# Patient Record
Sex: Male | Born: 1935 | Race: Asian | Hispanic: No | Marital: Married | Smoking: Never smoker
Health system: Southern US, Community
[De-identification: ages and names within clinical notes are randomized; demographics above are authoritative.]

## PROBLEM LIST (undated history)

## (undated) DIAGNOSIS — I10 Essential (primary) hypertension: Secondary | ICD-10-CM

## (undated) DIAGNOSIS — I639 Cerebral infarction, unspecified: Secondary | ICD-10-CM

## (undated) DIAGNOSIS — E119 Type 2 diabetes mellitus without complications: Secondary | ICD-10-CM

## (undated) DIAGNOSIS — I2510 Atherosclerotic heart disease of native coronary artery without angina pectoris: Secondary | ICD-10-CM

## (undated) DIAGNOSIS — G309 Alzheimer's disease, unspecified: Secondary | ICD-10-CM

## (undated) DIAGNOSIS — F0280 Dementia in other diseases classified elsewhere without behavioral disturbance: Secondary | ICD-10-CM

## (undated) HISTORY — PX: CORONARY ARTERY BYPASS GRAFT: SHX141

## (undated) HISTORY — DX: Type 2 diabetes mellitus without complications: E11.9

## (undated) HISTORY — DX: Dementia in other diseases classified elsewhere, unspecified severity, without behavioral disturbance, psychotic disturbance, mood disturbance, and anxiety: F02.80

## (undated) HISTORY — DX: Alzheimer's disease, unspecified: G30.9

## (undated) HISTORY — DX: Essential (primary) hypertension: I10

---

## 1998-11-14 ENCOUNTER — Ambulatory Visit (HOSPITAL_BASED_OUTPATIENT_CLINIC_OR_DEPARTMENT_OTHER): Admission: RE | Admit: 1998-11-14 | Discharge: 1998-11-14 | Payer: Self-pay | Admitting: Orthopedic Surgery

## 1999-05-03 ENCOUNTER — Ambulatory Visit (HOSPITAL_COMMUNITY): Admission: RE | Admit: 1999-05-03 | Discharge: 1999-05-03 | Payer: Self-pay | Admitting: Cardiology

## 1999-05-03 ENCOUNTER — Encounter: Payer: Self-pay | Admitting: Cardiology

## 2002-07-16 ENCOUNTER — Ambulatory Visit (HOSPITAL_COMMUNITY): Admission: RE | Admit: 2002-07-16 | Discharge: 2002-07-16 | Payer: Self-pay | Admitting: General Surgery

## 2002-07-16 ENCOUNTER — Encounter: Payer: Self-pay | Admitting: General Surgery

## 2002-12-24 ENCOUNTER — Ambulatory Visit (HOSPITAL_COMMUNITY): Admission: RE | Admit: 2002-12-24 | Discharge: 2002-12-24 | Payer: Self-pay | Admitting: Gastroenterology

## 2004-07-07 ENCOUNTER — Emergency Department (HOSPITAL_COMMUNITY): Admission: EM | Admit: 2004-07-07 | Discharge: 2004-07-07 | Payer: Self-pay | Admitting: Emergency Medicine

## 2011-07-19 ENCOUNTER — Telehealth: Payer: Self-pay | Admitting: Physician Assistant

## 2011-07-19 NOTE — Telephone Encounter (Signed)
Attempted rx refill

## 2011-08-05 ENCOUNTER — Other Ambulatory Visit: Payer: Self-pay | Admitting: Family Medicine

## 2011-09-09 ENCOUNTER — Other Ambulatory Visit: Payer: Self-pay | Admitting: Physician Assistant

## 2011-12-25 ENCOUNTER — Other Ambulatory Visit: Payer: Self-pay | Admitting: Physician Assistant

## 2012-01-17 ENCOUNTER — Encounter: Payer: Self-pay | Admitting: Family Medicine

## 2012-01-17 ENCOUNTER — Ambulatory Visit (INDEPENDENT_AMBULATORY_CARE_PROVIDER_SITE_OTHER): Payer: Medicare Other | Admitting: Family Medicine

## 2012-01-17 VITALS — BP 131/77 | HR 71 | Temp 98.2°F | Resp 16 | Ht 63.0 in | Wt 135.4 lb

## 2012-01-17 DIAGNOSIS — R4701 Aphasia: Secondary | ICD-10-CM

## 2012-01-17 DIAGNOSIS — I10 Essential (primary) hypertension: Secondary | ICD-10-CM

## 2012-01-17 DIAGNOSIS — R413 Other amnesia: Secondary | ICD-10-CM

## 2012-01-17 DIAGNOSIS — I639 Cerebral infarction, unspecified: Secondary | ICD-10-CM

## 2012-01-17 DIAGNOSIS — E119 Type 2 diabetes mellitus without complications: Secondary | ICD-10-CM

## 2012-01-17 DIAGNOSIS — I39 Endocarditis and heart valve disorders in diseases classified elsewhere: Secondary | ICD-10-CM

## 2012-01-17 LAB — COMPREHENSIVE METABOLIC PANEL
AST: 22 U/L (ref 0–37)
Albumin: 4 g/dL (ref 3.5–5.2)
Alkaline Phosphatase: 43 U/L (ref 39–117)
Glucose, Bld: 73 mg/dL (ref 70–99)
Potassium: 4.1 mEq/L (ref 3.5–5.3)
Sodium: 135 mEq/L (ref 135–145)
Total Bilirubin: 0.3 mg/dL (ref 0.3–1.2)
Total Protein: 6.5 g/dL (ref 6.0–8.3)

## 2012-01-17 LAB — GLUCOSE, POCT (MANUAL RESULT ENTRY): POC Glucose: 73 mg/dl (ref 70–99)

## 2012-01-17 MED ORDER — AMLODIPINE BESYLATE 5 MG PO TABS: 5.0000 mg | ORAL_TABLET | Freq: Every day | ORAL | Status: DC

## 2012-01-17 MED ORDER — MEMANTINE HCL 10 MG PO TABS: 10.0000 mg | ORAL_TABLET | Freq: Two times a day (BID) | ORAL | Status: DC

## 2012-01-17 MED ORDER — CLOPIDOGREL BISULFATE 75 MG PO TABS: 75.0000 mg | ORAL_TABLET | Freq: Every day | ORAL | Status: DC

## 2012-01-17 MED ORDER — METFORMIN HCL 500 MG PO TABS: 500.0000 mg | ORAL_TABLET | Freq: Two times a day (BID) | ORAL | Status: DC

## 2012-01-17 MED ORDER — DEXLANSOPRAZOLE 60 MG PO CPDR: 60.0000 mg | DELAYED_RELEASE_CAPSULE | Freq: Every day | ORAL | Status: DC

## 2012-01-17 MED ORDER — VALSARTAN 80 MG PO TABS: 80.0000 mg | ORAL_TABLET | Freq: Every day | ORAL | Status: DC

## 2012-01-17 NOTE — Patient Instructions (Addendum)
Recheck in the next 1-2 months, sooner if any worsening of symptoms.   Can use over the counter hydrocortisone up to 2 times per day on dry skin of elbows. Can use lubriderm or eucerin up to twice per day as needed for dry skin.  Your should receive a call or letter about your lab results within the next week to 10 days.

## 2012-01-17 NOTE — Progress Notes (Signed)
  Subjective:    Patient ID: Jonathan Richards, male    DOB: [birth year], 78 y.o.   MRN: [id number]  HPI Jonathan Richards is a 78 y.o. male Hx of DM, CVA with aphasia, memory loss. HTN, prior pt of Dr. Hal Hope.  Lives with son and daughter in law. Last ov - 02/11/11. Pneumovax, zostavax given. Metormin, Plavix, Diovan, Norvasc, Namenda rx.  Here for med refills.  Has been out of meds for awhile - atleast a week. No other prescibers of meds.  Has been on all listed meds (obtained from pharmacy).    Dry skin on elbows.  Used cream in past.    Last lipid panel in 8/12. LDL 119.  Last A1C 5.1 02/11/11.    Language barrier and he is unable to talk due to prior cva. .  No known change in symptoms or specific concerns per dtr in law (translating).    Not fasting today.   Review of Systems No new concerns, but limited due to communication barrier.      Objective:   Physical Exam  Constitutional: He appears well-developed and well-nourished. No distress.  HENT:  Head: Normocephalic and atraumatic.  Cardiovascular: Normal rate, regular rhythm, normal heart sounds and intact distal pulses.   Pulmonary/Chest: Effort normal and breath sounds normal.  Abdominal: There is no tenderness.  Neurological: He is alert.       Nonverbal.  Skin: Skin is warm and dry.     Psychiatric: He has a normal mood and affect. His behavior is normal.      Results for orders placed in visit on 01/17/12  GLUCOSE, POCT (MANUAL RESULT ENTRY)      Component Value Range   POC Glucose 73  70 - 99 mg/dl  POCT GLYCOSYLATED HEMOGLOBIN (HGB A1C)      Component Value Range   Hemoglobin A1C 5.4         Assessment & Plan:  .Jonathan Richards is a 78 y.o. male 1. DM2 (diabetes mellitus, type 2)  POCT glucose (manual entry), POCT glycosylated hemoglobin (Hb A1C), Comprehensive metabolic panel  2. HTN (hypertension)  Comprehensive metabolic panel  3. CVA (cerebral infarction)    4. Aphasia    5. Memory loss     DM - very well  controlled.  Will decrease metformin to 500mg  BID. Only to take if eating meals. Recheck in next 1-2 months.   HTN - stable. No change in meds.  Hyperlipidemia - check CMP, plan on fasting labs next ov.    Hx of CVA, with aphasia, memory loss.  Cont plavix and namenda at current doses. meds refilled for 3 months.   Dry skin on elbows. psoraisis likely vs eczema.   Trial otc cortisone, lubriderm or eucerin.  My need to restart TAC cream at next ov.   Discussed recommended diabetes visits every 3 months, plan on fasting lab visit in next 1-2 months.

## 2012-02-18 ENCOUNTER — Other Ambulatory Visit: Payer: Self-pay | Admitting: Family Medicine

## 2012-04-10 ENCOUNTER — Other Ambulatory Visit: Payer: Self-pay | Admitting: Family Medicine

## 2012-04-10 DIAGNOSIS — E119 Type 2 diabetes mellitus without complications: Secondary | ICD-10-CM

## 2012-04-10 MED ORDER — METFORMIN HCL 500 MG PO TABS: 500.0000 mg | ORAL_TABLET | Freq: Two times a day (BID) | ORAL | Status: DC

## 2012-04-10 NOTE — Telephone Encounter (Signed)
Amlodipine refilled.  The metformin 850mg  was not refilled, because the patient's dose was decreased to 500mg  at his last OV in August, so I have refilled at the correct dose.  Additionally, pt was due for follow-up in early October, needs appt for labs.

## 2012-04-12 ENCOUNTER — Other Ambulatory Visit: Payer: Self-pay | Admitting: Family Medicine

## 2012-04-17 ENCOUNTER — Other Ambulatory Visit: Payer: Self-pay | Admitting: Family Medicine

## 2012-05-15 ENCOUNTER — Other Ambulatory Visit: Payer: Self-pay | Admitting: Physician Assistant

## 2012-06-10 ENCOUNTER — Other Ambulatory Visit: Payer: Self-pay | Admitting: Physician Assistant

## 2012-06-19 ENCOUNTER — Other Ambulatory Visit: Payer: Self-pay | Admitting: Physician Assistant

## 2012-06-19 NOTE — Telephone Encounter (Signed)
Needs office visit for further refills

## 2012-07-12 ENCOUNTER — Other Ambulatory Visit: Payer: Self-pay | Admitting: Physician Assistant

## 2012-07-13 NOTE — Telephone Encounter (Signed)
Needs office visit and labs.

## 2012-07-18 ENCOUNTER — Other Ambulatory Visit: Payer: Self-pay | Admitting: Physician Assistant

## 2012-07-31 ENCOUNTER — Other Ambulatory Visit: Payer: Self-pay | Admitting: Physician Assistant

## 2012-08-31 ENCOUNTER — Other Ambulatory Visit: Payer: Self-pay

## 2012-08-31 MED ORDER — DEXLANSOPRAZOLE 60 MG PO CPDR: 60.0000 mg | DELAYED_RELEASE_CAPSULE | Freq: Every day | ORAL | Status: DC

## 2012-09-08 ENCOUNTER — Other Ambulatory Visit: Payer: Self-pay | Admitting: Family Medicine

## 2012-10-03 ENCOUNTER — Other Ambulatory Visit: Payer: Self-pay | Admitting: Physician Assistant

## 2012-10-11 ENCOUNTER — Other Ambulatory Visit: Payer: Self-pay | Admitting: Family Medicine

## 2012-10-11 ENCOUNTER — Other Ambulatory Visit: Payer: Self-pay | Admitting: Physician Assistant

## 2012-10-22 ENCOUNTER — Ambulatory Visit (INDEPENDENT_AMBULATORY_CARE_PROVIDER_SITE_OTHER): Payer: Medicare Other | Admitting: Family Medicine

## 2012-10-22 ENCOUNTER — Encounter: Payer: Self-pay | Admitting: Family Medicine

## 2012-10-22 VITALS — BP 120/70 | HR 82 | Temp 98.4°F | Resp 16 | Ht 65.0 in | Wt 139.0 lb

## 2012-10-22 DIAGNOSIS — F0390 Unspecified dementia without behavioral disturbance: Secondary | ICD-10-CM

## 2012-10-22 DIAGNOSIS — K219 Gastro-esophageal reflux disease without esophagitis: Secondary | ICD-10-CM

## 2012-10-22 DIAGNOSIS — E119 Type 2 diabetes mellitus without complications: Secondary | ICD-10-CM

## 2012-10-22 DIAGNOSIS — F03918 Unspecified dementia, unspecified severity, with other behavioral disturbance: Secondary | ICD-10-CM | POA: Insufficient documentation

## 2012-10-22 DIAGNOSIS — I10 Essential (primary) hypertension: Secondary | ICD-10-CM | POA: Insufficient documentation

## 2012-10-22 DIAGNOSIS — R32 Unspecified urinary incontinence: Secondary | ICD-10-CM

## 2012-10-22 DIAGNOSIS — E1151 Type 2 diabetes mellitus with diabetic peripheral angiopathy without gangrene: Secondary | ICD-10-CM | POA: Insufficient documentation

## 2012-10-22 DIAGNOSIS — R350 Frequency of micturition: Secondary | ICD-10-CM

## 2012-10-22 DIAGNOSIS — L409 Psoriasis, unspecified: Secondary | ICD-10-CM

## 2012-10-22 DIAGNOSIS — F0391 Unspecified dementia with behavioral disturbance: Secondary | ICD-10-CM | POA: Insufficient documentation

## 2012-10-22 LAB — POCT URINALYSIS DIPSTICK
Bilirubin, UA: NEGATIVE
Blood, UA: NEGATIVE
Glucose, UA: NEGATIVE
Ketones, UA: NEGATIVE
Leukocytes, UA: NEGATIVE
Nitrite, UA: NEGATIVE
Protein, UA: 30
Spec Grav, UA: 1.01
Urobilinogen, UA: 0.2
pH, UA: 7

## 2012-10-22 LAB — POCT UA - MICROSCOPIC ONLY
Bacteria, U Microscopic: NEGATIVE
Casts, Ur, LPF, POC: NEGATIVE
Crystals, Ur, HPF, POC: NEGATIVE
Mucus, UA: NEGATIVE
Yeast, UA: NEGATIVE

## 2012-10-22 LAB — POCT GLYCOSYLATED HEMOGLOBIN (HGB A1C): Hemoglobin A1C: 5.6

## 2012-10-22 MED ORDER — CLOPIDOGREL BISULFATE 75 MG PO TABS: 75.0000 mg | ORAL_TABLET | Freq: Every day | ORAL | Status: DC

## 2012-10-22 MED ORDER — MEMANTINE HCL 10 MG PO TABS: 10.0000 mg | ORAL_TABLET | Freq: Two times a day (BID) | ORAL | Status: DC

## 2012-10-22 MED ORDER — AMLODIPINE BESYLATE 5 MG PO TABS: 5.0000 mg | ORAL_TABLET | Freq: Every day | ORAL | Status: DC

## 2012-10-22 MED ORDER — VALSARTAN 80 MG PO TABS: 80.0000 mg | ORAL_TABLET | Freq: Every day | ORAL | Status: DC

## 2012-10-22 MED ORDER — DEXLANSOPRAZOLE 60 MG PO CPDR: 60.0000 mg | DELAYED_RELEASE_CAPSULE | Freq: Every day | ORAL | Status: DC

## 2012-10-22 NOTE — Progress Notes (Signed)
  Subjective:    Patient ID: Jonathan Richards, male    DOB: 10/03/1933, 79 y.o.   MRN: [id number]  HPI    Review of Systems  Constitutional: Negative.   HENT: Negative.   Eyes: Negative.   Respiratory:       When he eats son states likes it goes down the wrong hole.  Cardiovascular: Negative.   Gastrointestinal: Negative.   Endocrine: Negative.   Genitourinary: Positive for frequency.  Allergic/Immunologic: Negative.   Neurological: Negative.   Hematological: Negative.   Psychiatric/Behavioral: Negative.        Objective:   Physical Exam  See above      Assessment & Plan:  See above

## 2012-10-22 NOTE — Patient Instructions (Signed)
Advance Directives  (My Voice, My Choice)  Advance directives are a means for you to make choices about your health care. It is a way that you may accept or refuse medical treatment if you cannot speak for yourself. An advance directive gives you a way to express your wishes about treatment choices in the event that you cannot speak for yourself. These directives protect your right to make your own health care choices. Some examples of advance directives would be:  · A living will is a prepared document that designates your wishes in the event of a serious illness when you cannot care for yourself.  · A patient advocate designation for health care means you choose someone who knows your wishes and can speak for you, on your behalf, should you not be able to do so yourself. This is often a close friend or family member.  · Think about what is important for you in your life. To what extent do you want machines to keep you alive? How much pain are you willing to accept?  · Decide what types of life-sustaining treatments you would or would not want.  · Name a person to be your advocate who understands all your wishes and is willing and able to carry them out.  · A durable power of attorney for health care is a formal legal agreement with an attorney or legal representative who will be bound to carry out your wishes in the event you are unable to care for or represent yourself. This should be someone you trust to make important medical decisions for you.  · Do Not Resuscitate (DNR) is a request to do nothing in the event that your heart stops. A DNR order is used if you are very ill and not expected to recover. DNR orders are accepted by nearly all caregivers and hospitals.  Most caregiver's offices and hospitals have advance directive forms you can use. You may cancel or change these documents at any time. You must be mentally sound and able to communicate your wishes at the time you fill out these forms.  Regardless of  how you let your final wishes be known in the event of a terminal illness, make sure you discuss them with your family and friends. Copies should be given to your caregiver, your hospital, your advocate or attorney, and to significant others. If you travel, you may want to find out what is legal and binding in the states where you will be. Laws vary from state to state.  Document Released: 08/12/2001 Document Revised: 08/26/2011 Document Reviewed: 02/09/2008  Exit[employer]® Patient Information ©2013 [employer].

## 2012-10-22 NOTE — Progress Notes (Addendum)
79 yo [place] man who suffered stroke several years ago. He lives with his son and daughter in law with their three children.  Son needs to travel in July and would like some respite care for a month.  ADL's:  Can feed self, but aspirates frequently  Needs help with dressing  Needs assistance ambulating  Becoming incontinent of urine.  Continent of stool  Becomes irascible at times, complaining about water being too hot or cold  Patient does not recognize some much of the time and confuses words when requesting something  Medical problems:  Diabetes, post CVA, hypertension, urinary incontinence, home bound  Objective:  NAD; frequent hocking and grunting, alert HEENT: Edentulous, TMs normal, EOM normal Neck: Supple, no adenopathy, no bruits, no thyromegaly Chest: Few rhonchi, no rales, breathing normally Heart: Regular no murmur or gallop Abdomen: Soft and nontender Extremities: No edema Skin: Intact with no bedsores, psoriatic plaque in scalp Results for orders placed in visit on 01/17/12  COMPREHENSIVE METABOLIC PANEL      Result Value Range   Sodium 135  135 - 145 mEq/L   Potassium 4.1  3.5 - 5.3 mEq/L   Chloride 100  96 - 112 mEq/L   CO2 25  19 - 32 mEq/L   Glucose, Bld 73  70 - 99 mg/dL   BUN 11  6 - 23 mg/dL   Creat 1.61  0.96 - 0.45 mg/dL   Total Bilirubin 0.3  0.3 - 1.2 mg/dL   Alkaline Phosphatase 43  39 - 117 U/L   AST 22  0 - 37 U/L   ALT 19  0 - 53 U/L   Total Protein 6.5  6.0 - 8.3 g/dL   Albumin 4.0  3.5 - 5.2 g/dL   Calcium 9.7  8.4 - 40.9 mg/dL  GLUCOSE, POCT (MANUAL RESULT ENTRY)      Result Value Range   POC Glucose 73  70 - 99 mg/dl  POCT GLYCOSYLATED HEMOGLOBIN (HGB A1C)      Result Value Range   Hemoglobin A1C 5.4     Results for orders placed in visit on 10/22/12  POCT URINALYSIS DIPSTICK      Result Value Range   Color, UA yellow     Clarity, UA clear     Glucose, UA neg     Bilirubin, UA neg     Ketones, UA neg     Spec Grav, UA 1.010     Blood, UA neg     pH, UA 7.0     Protein, UA 30     Urobilinogen, UA 0.2     Nitrite, UA neg     Leukocytes, UA Negative    POCT UA - MICROSCOPIC ONLY      Result Value Range   WBC, Ur, HPF, POC 0-2     RBC, urine, microscopic 0-1     Bacteria, U Microscopic neg     Mucus, UA neg     Epithelial cells, urine per micros 0-1     Crystals, Ur, HPF, POC neg     Casts, Ur, LPF, POC neg     Yeast, UA neg    POCT GLYCOSYLATED HEMOGLOBIN (HGB A1C)      Result Value Range   Hemoglobin A1C 5.6       Assessment:  79 year old [place] man with multiple ADLs needs following CVA.  He will need nursing care while son is away. He also needs to consider advanced directives. At the present time, he  is stable in his current living situation, but the amount of energy the family expands in chart form must be enormous.  Plan:  Dementia - Plan: Ambulatory referral to Home Health, memantine (NAMENDA) 10 MG tablet, clopidogrel (PLAVIX) 75 MG tablet  Frequency of urination - Plan: POCT urinalysis dipstick, POCT UA - Microscopic Only, POCT glycosylated hemoglobin (Hb A1C), Ambulatory referral to Home Health  Type II or unspecified type diabetes mellitus without mention of complication, not stated as uncontrolled - Plan: POCT urinalysis dipstick, POCT UA - Microscopic Only, POCT glycosylated hemoglobin (Hb A1C), Ambulatory referral to Home Health  Hypertension - Plan: Ambulatory referral to Home Health, valsartan (DIOVAN) 80 MG tablet, amLODipine (NORVASC) 5 MG tablet  Type 2 diabetes mellitus - Plan: Ambulatory referral to Home Health  Incontinence - Plan: Ambulatory referral to Home Health  GERD (gastroesophageal reflux disease) - Plan: dexlansoprazole (DEXILANT) 60 MG capsule  Signed, Elvina Sidle, Md

## 2012-10-23 LAB — COMPREHENSIVE METABOLIC PANEL
ALT: 32 U/L (ref 0–53)
AST: 31 U/L (ref 0–37)
Albumin: 4.1 g/dL (ref 3.5–5.2)
Alkaline Phosphatase: 62 U/L (ref 39–117)
BUN: 13 mg/dL (ref 6–23)
CO2: 24 mEq/L (ref 19–32)
Calcium: 10 mg/dL (ref 8.4–10.5)
Chloride: 104 mEq/L (ref 96–112)
Creat: 1.3 mg/dL (ref 0.50–1.35)
Glucose, Bld: 94 mg/dL (ref 70–99)
Potassium: 4.5 mEq/L (ref 3.5–5.3)
Sodium: 138 mEq/L (ref 135–145)
Total Bilirubin: 0.4 mg/dL (ref 0.3–1.2)
Total Protein: 7.2 g/dL (ref 6.0–8.3)

## 2012-10-23 LAB — CBC WITH DIFFERENTIAL/PLATELET
Basophils Absolute: 0 10*3/uL (ref 0.0–0.1)
Basophils Relative: 0 % (ref 0–1)
Eosinophils Absolute: 0.2 10*3/uL (ref 0.0–0.7)
Eosinophils Relative: 2 % (ref 0–5)
HCT: 40.4 % (ref 39.0–52.0)
Hemoglobin: 14 g/dL (ref 13.0–17.0)
Lymphocytes Relative: 34 % (ref 12–46)
Lymphs Abs: 2.7 10*3/uL (ref 0.7–4.0)
MCH: 31.3 pg (ref 26.0–34.0)
MCHC: 34.7 g/dL (ref 30.0–36.0)
MCV: 90.4 fL (ref 78.0–100.0)
Monocytes Absolute: 0.8 10*3/uL (ref 0.1–1.0)
Monocytes Relative: 11 % (ref 3–12)
Neutro Abs: 4.2 10*3/uL (ref 1.7–7.7)
Neutrophils Relative %: 53 % (ref 43–77)
Platelets: 346 10*3/uL (ref 150–400)
RBC: 4.47 MIL/uL (ref 4.22–5.81)
RDW: 14.3 % (ref 11.5–15.5)
WBC: 8 10*3/uL (ref 4.0–10.5)

## 2012-11-04 ENCOUNTER — Telehealth: Payer: Self-pay | Admitting: Family Medicine

## 2012-11-04 NOTE — Telephone Encounter (Signed)
Notified pt's caregiver to pick forms up at 104.

## 2012-11-15 ENCOUNTER — Other Ambulatory Visit: Payer: Self-pay | Admitting: Physician Assistant

## 2012-12-14 ENCOUNTER — Encounter: Payer: Self-pay | Admitting: Family Medicine

## 2012-12-14 ENCOUNTER — Ambulatory Visit (INDEPENDENT_AMBULATORY_CARE_PROVIDER_SITE_OTHER): Payer: Medicare Other | Admitting: Family Medicine

## 2012-12-14 DIAGNOSIS — Z111 Encounter for screening for respiratory tuberculosis: Secondary | ICD-10-CM

## 2012-12-14 NOTE — Progress Notes (Signed)
Is a 79 year old [place] man is been living with his family. His son is going back to [place] and so he needs to be in a nursing home environment for one to 2 months. He tends to be confused at times but son states that he is usually continent.  Objective: I filled out the forms for the patient so that he can institutionalize well son is away

## 2012-12-16 ENCOUNTER — Telehealth: Payer: Self-pay

## 2012-12-23 ENCOUNTER — Telehealth: Payer: Self-pay

## 2012-12-23 NOTE — Telephone Encounter (Signed)
Do you want to go with their recommendations? Or do you have specific orders?

## 2012-12-23 NOTE — Telephone Encounter (Signed)
[name] w/home health care services is asking for orders to fulfill all the wishes for this patient.  He has numerous issues and they need to know what services Dr L wants them to provide to patient    Jonathan Richards  [phone]

## 2012-12-24 ENCOUNTER — Telehealth: Payer: Self-pay

## 2012-12-24 NOTE — Telephone Encounter (Signed)
I have called [name] left message for her to call me back.

## 2012-12-24 NOTE — Telephone Encounter (Signed)
Let's go with their recommendations.  Otherwise, have patient return for OV to discuss.

## 2012-12-24 NOTE — Telephone Encounter (Signed)
I've really only seen this demented man once.  Let's go with the home health service.  Otherwise, he can come in and see anyone.

## 2012-12-24 NOTE — Telephone Encounter (Signed)
Called [name], order given eval/treat

## 2012-12-24 NOTE — Telephone Encounter (Signed)
[name] from [facility] is returning a phone call about this patient. [phone]

## 2012-12-26 ENCOUNTER — Encounter (HOSPITAL_COMMUNITY): Payer: Self-pay

## 2012-12-26 ENCOUNTER — Emergency Department (HOSPITAL_COMMUNITY)
Admission: EM | Admit: 2012-12-26 | Discharge: 2012-12-26 | Disposition: A | Discharge status: Home / Self Care | Payer: Medicare Other | Attending: Emergency Medicine | Admitting: Emergency Medicine

## 2012-12-26 DIAGNOSIS — E119 Type 2 diabetes mellitus without complications: Secondary | ICD-10-CM | POA: Insufficient documentation

## 2012-12-26 DIAGNOSIS — G309 Alzheimer's disease, unspecified: Secondary | ICD-10-CM | POA: Insufficient documentation

## 2012-12-26 DIAGNOSIS — Y929 Unspecified place or not applicable: Secondary | ICD-10-CM | POA: Insufficient documentation

## 2012-12-26 DIAGNOSIS — I10 Essential (primary) hypertension: Secondary | ICD-10-CM | POA: Insufficient documentation

## 2012-12-26 DIAGNOSIS — W010XXA Fall on same level from slipping, tripping and stumbling without subsequent striking against object, initial encounter: Secondary | ICD-10-CM

## 2012-12-26 DIAGNOSIS — R296 Repeated falls: Secondary | ICD-10-CM | POA: Insufficient documentation

## 2012-12-26 DIAGNOSIS — Y939 Activity, unspecified: Secondary | ICD-10-CM | POA: Insufficient documentation

## 2012-12-26 DIAGNOSIS — F0390 Unspecified dementia without behavioral disturbance: Secondary | ICD-10-CM

## 2012-12-26 DIAGNOSIS — F0280 Dementia in other diseases classified elsewhere without behavioral disturbance: Secondary | ICD-10-CM | POA: Insufficient documentation

## 2012-12-26 DIAGNOSIS — Z79899 Other long term (current) drug therapy: Secondary | ICD-10-CM | POA: Insufficient documentation

## 2012-12-26 NOTE — ED Provider Notes (Signed)
History    CSN: [id number] Arrival date & time 12/26/12  1329  First MD Initiated Contact with Patient 12/26/12 1426     Chief Complaint  Patient presents with  . Fall   (Consider location/radiation/quality/duration/timing/severity/associated sxs/prior Treatment) Patient is a 79 y.o. male presenting with fall. The history is provided by the patient, a relative and the EMS personnel.  Fall  pt from [facility] via ems, witness fall, no loc.  Hx dementia. ems notes pt awake and alert throughout eval/transport.  On arrival to ed, pt alert, content, although wants c collar off.  Interpreter/translator, pts son.  Family indicates pts mental status c/w baseline, pt denies any pain or c/o.  Limited hx - dementia - level 5 caveat.    Past Medical History  Diagnosis Date  . Diabetes mellitus without complication   . Hypertension   . Alzheimer disease    Past Surgical History  Procedure Laterality Date  . Coronary artery bypass graft     Family History  Problem Relation Age of Onset  . Heart attack Son    History  Substance Use Topics  . Smoking status: Never Smoker   . Smokeless tobacco: Not on file  . Alcohol Use: Not on file    Review of Systems  Unable to perform ROS: Dementia  level 5 caveat   Allergies  Asa  Home Medications   Current Outpatient Rx  Name  Route  Sig  Dispense  Refill  . amLODipine (NORVASC) 5 MG tablet   Oral   Take 5 mg by mouth every morning.         . clopidogrel (PLAVIX) 75 MG tablet   Oral   Take 75 mg by mouth every morning.         Marland Kitchen dexlansoprazole (DEXILANT) 60 MG capsule   Oral   Take 60 mg by mouth every morning.         . Emollient (CETAPHIL) cream   Topical   Apply 1 application topically 2 (two) times daily as needed (for itchy skin).         . memantine (NAMENDA) 10 MG tablet   Oral   Take 10 mg by mouth every morning.         . metFORMIN (GLUCOPHAGE) 500 MG tablet   Oral   Take 500 mg by mouth at bedtime.        . valsartan (DIOVAN) 80 MG tablet   Oral   Take 80 mg by mouth every morning.          BP 163/81  Pulse 83  Temp(Src) 97.9 F (36.6 C) (Oral)  Resp 20  SpO2 98% Physical Exam  Nursing note and vitals reviewed. Constitutional: He appears well-developed and well-nourished. No distress.  HENT:  Head: Atraumatic.  No facial or scalp sts, contusion, or tenderness.  Eyes: Pupils are equal, round, and reactive to light.  Neck: Neck supple. No tracheal deviation present.  Cardiovascular: Normal rate, normal heart sounds and intact distal pulses.   Pulmonary/Chest: Effort normal and breath sounds normal. No accessory muscle usage. No respiratory distress. He exhibits no tenderness.  Abdominal: Soft. He exhibits no distension. There is no tenderness.  Musculoskeletal: Normal range of motion. He exhibits no edema and no tenderness.  CTLS spine, non tender, aligned, no step off. Good rom bil extremities without pain or focal bony tenderness.   Neurological: He is alert.  Purposeful movement bil extremities. Equal grip, motor 5/5 bil. Ambulates. Mental status c/w baseline  per family.   Skin: Skin is warm and dry.  Psychiatric: He has a normal mood and affect.    ED Course  Procedures (including critical care time)   MDM  Reviewed nursing notes and prior charts for additional history.    Pt denies any c/o or pain.  Family states pt/mental status c/w baseline.  Spine nt. No focal bony tenderness.  Pt appears stable for d/c.     Suzi Roots, MD 12/26/12 1436

## 2012-12-26 NOTE — ED Notes (Signed)
[id number]<AV> Expected date:12/26/12<BR> Expected time: 1:21 PM<BR> Means of arrival:<BR> Comments:<BR> fall

## 2012-12-26 NOTE — ED Notes (Signed)
[facility] co-resident observed pt. To fall and notified their nursing staff, who called EMS.  Pt. Is non-English speaker, who is in spinal immobilization paraphernalia, including backboard and rigid C-collar.  He has sm. Abrasion at right elbow and right ant. Knee.  He is awake, alert and in no distress, however he makes it clear that the immobilization equipment is uncomfortable.

## 2012-12-28 ENCOUNTER — Telehealth: Payer: Self-pay

## 2012-12-28 NOTE — Telephone Encounter (Signed)
Home Health Care Nurse reports patient refused care    No need to return call:   [phone]

## 2012-12-28 NOTE — Telephone Encounter (Signed)
Noted. To you FYI

## 2013-01-06 ENCOUNTER — Telehealth: Payer: Self-pay

## 2013-01-06 NOTE — Telephone Encounter (Signed)
Form signed. Given to [facility] to fax and then scan.

## 2013-01-06 NOTE — Telephone Encounter (Signed)
[facility] called and sent fax for Dr Cain Saupe signature that I was told was time sensitive. Can a PA review/sign for Dr L or does it need to wait for his return? I have put in PA box at TL desk.

## 2013-01-07 NOTE — Telephone Encounter (Signed)
Jonathan Richards is requesting proof of TB given to patient  [phone]

## 2013-01-07 NOTE — Telephone Encounter (Signed)
TB skin test placed by [place], but patient did not return to have this read. Faxed information to them.

## 2013-02-04 ENCOUNTER — Ambulatory Visit: Payer: Medicare Other | Admitting: Family Medicine

## 2013-05-19 ENCOUNTER — Telehealth: Payer: Self-pay

## 2013-05-19 NOTE — Telephone Encounter (Signed)
This does not appear to be our patient, has seen you in May, can you order this without visit?

## 2013-05-19 NOTE — Telephone Encounter (Signed)
Patient has own PCP.  I recommend they call PCP.

## 2013-05-19 NOTE — Telephone Encounter (Signed)
Nurse Efraim Kaufmann is calling to get an order for diet change for patient. Patient is on a puree diet however he has false teeth and refuses to wear them therefore patient needs to be placed on another diet to fit his needs.  [phone]

## 2013-05-20 ENCOUNTER — Telehealth: Payer: Self-pay | Admitting: Radiology

## 2013-05-20 DIAGNOSIS — R1310 Dysphagia, unspecified: Secondary | ICD-10-CM

## 2013-05-20 NOTE — Telephone Encounter (Signed)
Called nurse Melissa to advise. Left detailed message.

## 2013-05-20 NOTE — Telephone Encounter (Signed)
Spoke again to [facility], nursing staff and patient requesting puree diet . You filled out the FL2 form,you are patients only physician, you gave current diet ,they are calling again, patient refusing to eat, unless he has puree diet. please advise, previously you indicated call PCP, but despite lack of documentation on our part, you ARE PCP. Please advise I can call nurse back.[phone]

## 2013-05-24 NOTE — Telephone Encounter (Signed)
You sent me this message unanswered, are you going to change his diet?

## 2013-06-24 ENCOUNTER — Telehealth: Payer: Self-pay

## 2013-06-24 ENCOUNTER — Emergency Department (HOSPITAL_COMMUNITY): Payer: Medicare Other

## 2013-06-24 ENCOUNTER — Encounter (HOSPITAL_COMMUNITY): Payer: Self-pay | Admitting: Emergency Medicine

## 2013-06-24 ENCOUNTER — Inpatient Hospital Stay (HOSPITAL_COMMUNITY)
Admission: EM | Admit: 2013-06-24 | Discharge: 2013-06-25 | DRG: 087 | Disposition: A | Discharge status: Nursing Home (Includes ICF) | Payer: Medicare Other | Attending: Family Medicine | Admitting: Family Medicine

## 2013-06-24 DIAGNOSIS — W19XXXA Unspecified fall, initial encounter: Secondary | ICD-10-CM

## 2013-06-24 DIAGNOSIS — Z9181 History of falling: Secondary | ICD-10-CM

## 2013-06-24 DIAGNOSIS — I619 Nontraumatic intracerebral hemorrhage, unspecified: Secondary | ICD-10-CM

## 2013-06-24 DIAGNOSIS — G309 Alzheimer's disease, unspecified: Secondary | ICD-10-CM | POA: Diagnosis present

## 2013-06-24 DIAGNOSIS — I10 Essential (primary) hypertension: Secondary | ICD-10-CM | POA: Diagnosis present

## 2013-06-24 DIAGNOSIS — F0390 Unspecified dementia without behavioral disturbance: Secondary | ICD-10-CM | POA: Diagnosis present

## 2013-06-24 DIAGNOSIS — E1151 Type 2 diabetes mellitus with diabetic peripheral angiopathy without gangrene: Secondary | ICD-10-CM | POA: Diagnosis present

## 2013-06-24 DIAGNOSIS — Z886 Allergy status to analgesic agent status: Secondary | ICD-10-CM

## 2013-06-24 DIAGNOSIS — F03918 Unspecified dementia, unspecified severity, with other behavioral disturbance: Secondary | ICD-10-CM | POA: Diagnosis present

## 2013-06-24 DIAGNOSIS — I69991 Dysphagia following unspecified cerebrovascular disease: Secondary | ICD-10-CM

## 2013-06-24 DIAGNOSIS — I69920 Aphasia following unspecified cerebrovascular disease: Secondary | ICD-10-CM

## 2013-06-24 DIAGNOSIS — I629 Nontraumatic intracranial hemorrhage, unspecified: Secondary | ICD-10-CM | POA: Diagnosis present

## 2013-06-24 DIAGNOSIS — IMO0002 Reserved for concepts with insufficient information to code with codable children: Principal | ICD-10-CM | POA: Diagnosis present

## 2013-06-24 DIAGNOSIS — Z951 Presence of aortocoronary bypass graft: Secondary | ICD-10-CM

## 2013-06-24 DIAGNOSIS — Y9210 Unspecified residential institution as the place of occurrence of the external cause: Secondary | ICD-10-CM | POA: Diagnosis present

## 2013-06-24 DIAGNOSIS — E119 Type 2 diabetes mellitus without complications: Secondary | ICD-10-CM | POA: Diagnosis present

## 2013-06-24 DIAGNOSIS — F0280 Dementia in other diseases classified elsewhere without behavioral disturbance: Secondary | ICD-10-CM | POA: Diagnosis present

## 2013-06-24 DIAGNOSIS — W010XXA Fall on same level from slipping, tripping and stumbling without subsequent striking against object, initial encounter: Secondary | ICD-10-CM | POA: Diagnosis present

## 2013-06-24 DIAGNOSIS — L408 Other psoriasis: Secondary | ICD-10-CM | POA: Diagnosis present

## 2013-06-24 DIAGNOSIS — R1312 Dysphagia, oropharyngeal phase: Secondary | ICD-10-CM | POA: Diagnosis present

## 2013-06-24 DIAGNOSIS — Z79899 Other long term (current) drug therapy: Secondary | ICD-10-CM

## 2013-06-24 DIAGNOSIS — Z8249 Family history of ischemic heart disease and other diseases of the circulatory system: Secondary | ICD-10-CM

## 2013-06-24 DIAGNOSIS — S0190XA Unspecified open wound of unspecified part of head, initial encounter: Secondary | ICD-10-CM | POA: Diagnosis present

## 2013-06-24 DIAGNOSIS — R32 Unspecified urinary incontinence: Secondary | ICD-10-CM | POA: Diagnosis present

## 2013-06-24 DIAGNOSIS — Z7902 Long term (current) use of antithrombotics/antiplatelets: Secondary | ICD-10-CM

## 2013-06-24 DIAGNOSIS — I2510 Atherosclerotic heart disease of native coronary artery without angina pectoris: Secondary | ICD-10-CM | POA: Diagnosis present

## 2013-06-24 DIAGNOSIS — F0391 Unspecified dementia with behavioral disturbance: Secondary | ICD-10-CM | POA: Diagnosis present

## 2013-06-24 HISTORY — DX: Atherosclerotic heart disease of native coronary artery without angina pectoris: I25.10

## 2013-06-24 LAB — CBC WITH DIFFERENTIAL/PLATELET
Basophils Absolute: 0 10*3/uL (ref 0.0–0.1)
Basophils Relative: 0 % (ref 0–1)
EOS PCT: 0 % (ref 0–5)
Eosinophils Absolute: 0 10*3/uL (ref 0.0–0.7)
HEMATOCRIT: 49.2 % (ref 39.0–52.0)
HEMOGLOBIN: 17 g/dL (ref 13.0–17.0)
LYMPHS ABS: 2 10*3/uL (ref 0.7–4.0)
LYMPHS PCT: 16 % (ref 12–46)
MCH: 32.6 pg (ref 26.0–34.0)
MCHC: 34.6 g/dL (ref 30.0–36.0)
MCV: 94.4 fL (ref 78.0–100.0)
MONO ABS: 0.8 10*3/uL (ref 0.1–1.0)
MONOS PCT: 6 % (ref 3–12)
Neutro Abs: 9.3 10*3/uL — ABNORMAL HIGH (ref 1.7–7.7)
Neutrophils Relative %: 77 % (ref 43–77)
Platelets: 308 10*3/uL (ref 150–400)
RBC: 5.21 MIL/uL (ref 4.22–5.81)
RDW: 13.4 % (ref 11.5–15.5)
WBC: 12.1 10*3/uL — AB (ref 4.0–10.5)

## 2013-06-24 LAB — HEMOGLOBIN A1C
Hgb A1c MFr Bld: 7.9 % — ABNORMAL HIGH (ref <5.7)
Mean Plasma Glucose: 180 mg/dL — ABNORMAL HIGH (ref <117)

## 2013-06-24 LAB — COMPREHENSIVE METABOLIC PANEL
ALT: 57 U/L — AB (ref 0–53)
AST: 79 U/L — ABNORMAL HIGH (ref 0–37)
Albumin: 4 g/dL (ref 3.5–5.2)
Alkaline Phosphatase: 88 U/L (ref 39–117)
BUN: 11 mg/dL (ref 6–23)
CALCIUM: 10 mg/dL (ref 8.4–10.5)
CO2: 26 meq/L (ref 19–32)
Chloride: 97 mEq/L (ref 96–112)
Creatinine, Ser: 1.19 mg/dL (ref 0.50–1.35)
GFR, EST AFRICAN AMERICAN: 66 mL/min — AB (ref >90)
GFR, EST NON AFRICAN AMERICAN: 57 mL/min — AB (ref >90)
GLUCOSE: 191 mg/dL — AB (ref 70–99)
Potassium: 4.1 mEq/L (ref 3.7–5.3)
SODIUM: 138 meq/L (ref 137–147)
Total Bilirubin: 0.4 mg/dL (ref 0.3–1.2)
Total Protein: 8.4 g/dL — ABNORMAL HIGH (ref 6.0–8.3)

## 2013-06-24 LAB — GLUCOSE, CAPILLARY: Glucose-Capillary: 131 mg/dL — ABNORMAL HIGH (ref 70–99)

## 2013-06-24 MED ORDER — SODIUM CHLORIDE 0.9 % IV SOLN: 250.0000 mL | INTRAVENOUS | Status: DC | PRN

## 2013-06-24 MED ORDER — ACETAMINOPHEN 325 MG PO TABS: 650.0000 mg | ORAL_TABLET | Freq: Four times a day (QID) | ORAL | Status: DC | PRN

## 2013-06-24 MED ORDER — INSULIN ASPART 100 UNIT/ML ~~LOC~~ SOLN
0.0000 [IU] | Freq: Three times a day (TID) | SUBCUTANEOUS | Status: DC
  Administered 2013-06-25: 2 [IU] via SUBCUTANEOUS

## 2013-06-24 MED ORDER — MEMANTINE HCL 10 MG PO TABS
10.0000 mg | ORAL_TABLET | Freq: Every morning | ORAL | Status: DC
  Administered 2013-06-25: 10 mg via ORAL
  Filled 2013-06-24: qty 1

## 2013-06-24 MED ORDER — ACETAMINOPHEN 650 MG RE SUPP: 650.0000 mg | Freq: Four times a day (QID) | RECTAL | Status: DC | PRN

## 2013-06-24 MED ORDER — SODIUM CHLORIDE 0.9 % IJ SOLN: 3.0000 mL | Freq: Two times a day (BID) | INTRAMUSCULAR | Status: DC

## 2013-06-24 MED ORDER — PANTOPRAZOLE SODIUM 40 MG PO TBEC: 40.0000 mg | DELAYED_RELEASE_TABLET | Freq: Every day | ORAL | Status: DC

## 2013-06-24 MED ORDER — AMLODIPINE BESYLATE 5 MG PO TABS
5.0000 mg | ORAL_TABLET | Freq: Every morning | ORAL | Status: DC
  Administered 2013-06-25: 5 mg via ORAL
  Filled 2013-06-24: qty 1

## 2013-06-24 MED ORDER — SODIUM CHLORIDE 0.9 % IJ SOLN
3.0000 mL | INTRAMUSCULAR | Status: DC | PRN
Start: 2013-06-24 — End: 2013-06-24

## 2013-06-24 MED ORDER — TETANUS-DIPHTH-ACELL PERTUSSIS 5-2.5-18.5 LF-MCG/0.5 IM SUSP
0.5000 mL | Freq: Once | INTRAMUSCULAR | Status: AC
  Administered 2013-06-24: 0.5 mL via INTRAMUSCULAR
  Filled 2013-06-24: qty 0.5

## 2013-06-24 MED ORDER — PANTOPRAZOLE SODIUM 40 MG PO TBEC
40.0000 mg | DELAYED_RELEASE_TABLET | Freq: Every day | ORAL | Status: DC
  Administered 2013-06-25: 40 mg via ORAL
  Filled 2013-06-24: qty 1

## 2013-06-24 MED ORDER — IRBESARTAN 75 MG PO TABS
75.0000 mg | ORAL_TABLET | Freq: Every day | ORAL | Status: DC
  Administered 2013-06-25: 75 mg via ORAL
  Filled 2013-06-24: qty 1

## 2013-06-24 MED ORDER — DEXTROSE-NACL 5-0.45 % IV SOLN
INTRAVENOUS | Status: DC
Start: 2013-06-24 — End: 2013-06-25

## 2013-06-24 NOTE — ED Provider Notes (Signed)
CSN: [id number]     Arrival date & time 06/24/13  1120 History   First MD Initiated Contact with Patient 06/24/13 1147     Chief Complaint  Patient presents with  . Fall   (Consider location/radiation/quality/duration/timing/severity/associated sxs/prior Treatment) The history is provided by the EMS personnel and a relative.   Patient brought in from [facility], found on floor of bathroom sitting up with scrapes on his head.  He is on xarelto.  Per son, patient falls regularly.  He is s/p stroke and at baseline only communicates a little bit, only understandable to his family.  He denies any pain.  No recent illness.  Son states patient is at his baseline.  Level V caveat for dementia and decreased communication s/p stroke.    Past Medical History  Diagnosis Date  . Diabetes mellitus without complication   . Hypertension   . Alzheimer disease    Past Surgical History  Procedure Laterality Date  . Coronary artery bypass graft     Family History  Problem Relation Age of Onset  . Heart attack Son    History  Substance Use Topics  . Smoking status: Never Smoker   . Smokeless tobacco: Not on file  . Alcohol Use: Not on file    Review of Systems  Unable to perform ROS: Dementia    Allergies  Asa  Home Medications   Current Outpatient Rx  Name  Route  Sig  Dispense  Refill  . amLODipine (NORVASC) 5 MG tablet   Oral   Take 5 mg by mouth every morning.         . clopidogrel (PLAVIX) 75 MG tablet   Oral   Take 75 mg by mouth every morning.         Marland Kitchen dexlansoprazole (DEXILANT) 60 MG capsule   Oral   Take 60 mg by mouth every morning.         . Emollient (CETAPHIL) cream   Topical   Apply 1 application topically 2 (two) times daily as needed (for itchy skin).         . memantine (NAMENDA) 10 MG tablet   Oral   Take 10 mg by mouth every morning.         . metFORMIN (GLUCOPHAGE) 500 MG tablet   Oral   Take 500 mg by mouth at bedtime.         .  valsartan (DIOVAN) 80 MG tablet   Oral   Take 80 mg by mouth every morning.          BP 143/86  Pulse 84  Temp(Src) 97.8 F (36.6 C)  Resp 17  SpO2 99% Physical Exam  Nursing note and vitals reviewed. Constitutional: He appears well-developed and well-nourished. No distress.  HENT:  Head: Normocephalic.    Neck: Neck supple.  Cardiovascular: Normal rate and regular rhythm.   Pulmonary/Chest: Effort normal and breath sounds normal. No respiratory distress. He has no wheezes. He has no rales.  Abdominal: Soft. He exhibits no distension. There is no tenderness. There is no rebound and no guarding.  Neurological: He is alert. He exhibits normal muscle tone.  Reflex Scores:      Bicep reflexes are 0 on the left side. Pt moves all extremities well, 5/5 strength.   Communicates in [place] with son, appears to be chronically dysarthric.  Limited neurologic exam is grossly intact.   Skin: He is not diaphoretic.    ED Course  Procedures (including critical care  time) Labs Review Labs Reviewed  CBC WITH DIFFERENTIAL - Abnormal; Notable for the following:    WBC 12.1 (*)    Neutro Abs 9.3 (*)    All other components within normal limits  COMPREHENSIVE METABOLIC PANEL - Abnormal; Notable for the following:    Glucose, Bld 191 (*)    Total Protein 8.4 (*)    AST 79 (*)    ALT 57 (*)    GFR calc non Af Amer 57 (*)    GFR calc Af Amer 66 (*)    All other components within normal limits  URINALYSIS, ROUTINE W REFLEX MICROSCOPIC  HEMOGLOBIN A1C   Imaging Review Dg Chest 1 View  06/24/2013   CLINICAL DATA:  Fall.  EXAM: CHEST - 1 VIEW  COMPARISON:  07/07/2004  FINDINGS: Changes from CABG surgery are stable. The cardiac silhouette is normal in size and configuration normal mediastinal and hilar contours. Mild elevation right hemidiaphragm, stable. Lungs are clear. No pleural effusion or pneumothorax.  Bony thorax is demineralized but grossly intact  IMPRESSION: No acute  cardiopulmonary disease.   Electronically Signed   By: Amie Portlandavid  Ormond M.D.   On: 06/24/2013 13:03   Dg Pelvis 1-2 Views  06/24/2013   CLINICAL DATA:  Fall  EXAM: PELVIS - 1-2 VIEW  COMPARISON:  None.  FINDINGS: Negative for fracture. If the patient has hip pain, dedicated images of the symptomatic hip are suggested.  Atherosclerotic calcification.  IMPRESSION: Negative for fracture.   Electronically Signed   By: Marlan Palauharles  Clark M.D.   On: 06/24/2013 13:07   Ct Head Wo Contrast  06/24/2013   CLINICAL DATA:  Fall.  EXAM: CT HEAD WITHOUT CONTRAST  CT CERVICAL SPINE WITHOUT CONTRAST  TECHNIQUE: Multidetector CT imaging of the head and cervical spine was performed following the standard protocol without intravenous contrast. Multiplanar CT image reconstructions of the cervical spine were also generated.  COMPARISON:  None.  FINDINGS: CT HEAD FINDINGS  Ill-defined 1 cm hyperdensity in the right frontal cortex medially, most likely due to acute hemorrhage. Followup suggested. No subdural hemorrhage.  Chronic left MCA infarct. Mild chronic ischemic change in the cerebral white matter on the right. Chronic right cerebellar infarct. Negative for acute infarct or mass. Negative for skull fracture.  CT CERVICAL SPINE FINDINGS  Negative for fracture.  Left foraminal narrowing at C2-3 due to facet hypertrophy. Moderate spondylosis at C3-4 causing spinal stenosis. Mild degenerative change at C6-7.  IMPRESSION: 1 cm right frontal hyperdensity compatible with acute hemorrhage. Follow-up CT recommended.  Chronic left MCA infarct.  Cervical spondylosis.  Negative for fracture.  Critical Value/emergent results were called by telephone at the time of interpretation on 06/24/2013 at 12:37 PM to Dr. Trixie DredgeEMILY Eliannah Hinde , who verbally acknowledged these results.   Electronically Signed   By: Marlan Palauharles  Clark M.D.   On: 06/24/2013 12:38   Ct Cervical Spine Wo Contrast  06/24/2013   CLINICAL DATA:  Fall.  EXAM: CT HEAD WITHOUT CONTRAST  CT CERVICAL  SPINE WITHOUT CONTRAST  TECHNIQUE: Multidetector CT imaging of the head and cervical spine was performed following the standard protocol without intravenous contrast. Multiplanar CT image reconstructions of the cervical spine were also generated.  COMPARISON:  None.  FINDINGS: CT HEAD FINDINGS  Ill-defined 1 cm hyperdensity in the right frontal cortex medially, most likely due to acute hemorrhage. Followup suggested. No subdural hemorrhage.  Chronic left MCA infarct. Mild chronic ischemic change in the cerebral white matter on the right. Chronic right cerebellar infarct.  Negative for acute infarct or mass. Negative for skull fracture.  CT CERVICAL SPINE FINDINGS  Negative for fracture.  Left foraminal narrowing at C2-3 due to facet hypertrophy. Moderate spondylosis at C3-4 causing spinal stenosis. Mild degenerative change at C6-7.  IMPRESSION: 1 cm right frontal hyperdensity compatible with acute hemorrhage. Follow-up CT recommended.  Chronic left MCA infarct.  Cervical spondylosis.  Negative for fracture.  Critical Value/emergent results were called by telephone at the time of interpretation on 06/24/2013 at 12:37 PM to Dr. Trixie Dredge , who verbally acknowledged these results.   Electronically Signed   By: Marlan Palau M.D.   On: 06/24/2013 12:38    EKG Interpretation    Date/Time:  Thursday June 24 2013 11:23:50 EST Ventricular Rate:  84 PR Interval:  146 QRS Duration: 64 QT Interval:  375 QTC Calculation: 443 R Axis:   59 Text Interpretation:  Sinus rhythm Posterior infarct, old Borderline repolarization abnormality Confirmed by HARRISON  MD, FORREST ([id number]) on 06/24/2013 12:14:27 PM           12:41 PM  Call received from radiologist.  Pt found to have 1cm parenchymal hemorrhage, right frontal lobe.  Discussed patient with Dr Romeo Apple who will also see the patient.  Plan is for admission for observation, repeat film in the morning.   1:55 PM Discussed patient with [facility] who  agrees to admit this patient.   MDM   1. Fall, initial encounter   2. Cerebral parenchymal hemorrhage   3. Dementia    Pt with hx cva and dementia, on xarelto, found on bathroom floor by facility staff.  Pt has abrasion to right forehead/frontal scalp and was found to have 1cm right frontal density c/w hemorrhage. Workup otherwise unremarkable- mild elevation of WBC, LFTs, glucose. UA pending at time of admission. Pt admitted to [facility] (patient's primary care provider is [facility]).    Trixie Dredge, PA-C 06/24/13 1529

## 2013-06-24 NOTE — Telephone Encounter (Signed)
[facility]. I have put in Dr Cain SaupeL's box for review.

## 2013-06-24 NOTE — H&P (Signed)
Family Medicine Teaching [facility] Admission History and Physical Service Pager: [phone]  Patient name: Jonathan Richards Medical record number: [id number] Date of birth: 04-21-1934 Age: 80 y.o. Gender: male  Primary Care Provider: No primary provider on file. Consultants: [facility]eline consult with Dr. Gerlene Fee (neurosurgery) Code Status: Full  Chief Complaint: fall with head laceration.   Assessment and Plan: Jonathan Richards is a 80 y.o. male presenting with acute intracranial hemorrhage after a fall at his SNF this am. PMH is significant for T2DM, dementia, HTN, CVA, and CVA s/p CABG. his son explains that at baseline the patient is noncommunicative but able to walk, dress himself, and feed himself, but needs help bathing. He states that he had a large stroke about 10 years ago rendering him in his current condition which has been unchanged.  Acute intracranial hemorrhage - Discussed with neurosurgery on the phone who reviewed the film and recommends repeat CT in the am.  - Appears to be mechanical fall per conversation with nurse at SNF - Considering bleed will hold antiplatelet used for secondary prevention from previous stroke. - New exam is very limited, however he is at baseline per his son. - PT/OT consult - RN stroke swallow screen then soft diet - Likely quick DC back to SNF  T2DM - last A1C 5.6, repeat - Holding metformin, SSI  HTN - Well managed today - Continue ARB and CCB  H/o CVA - Hold plavix  Dementia - continue namenda, at baseline  FEN/GI: mechanically soft diet after RN swallow screen, Mechan Prophylaxis: SCDs, No heparin per acute hemmorhage  Disposition: tele for observation and repeat CT scan in am.   History of Present Illness: Jonathan Richards is a 80 y.o. male presenting after a fall. He has a history of dementia and multiple CVAs, and heart disease.   He lives in [facility] and has a history of falling "frequently", however his most recent was  3 months ago. Today he was found sitting on the ground conscious on the bathroom floor in no apparent distress after presumptive fall. Per records, he is on xarelto, though the indication for this is not clear. He is largely independent with his ADLs. He has baseline dementia and speaks only vietnamese at baseline. His son describes that he is currently at baseline mental status.  Per conversation with charge nurse at his SNF he was found down in his bathroom this morning with a laceration on his right had. She describes that he had stooled himself and apparently tripped as he was twisted up in his pants and one shoe was off. She states that is not a frequent faller and has maybe fell once in the last year.  Level V caveat applies as pt is demented and non-english speaking. Pt's son is used as Nurse, learning disability and for entirety of history and states that he does not understand much of his father's language in their native language.  He unable to answer straight forward questions from his son.   Review Of Systems: Level V caveat as above for dementia.   Patient Active Problem List   Diagnosis Date Noted  . Hypertension 10/22/2012  . Dementia 10/22/2012  . Type 2 diabetes mellitus 10/22/2012  . Incontinence 10/22/2012  . Psoriasis of scalp 10/22/2012   Past Medical History: Past Medical History  Diagnosis Date  . Diabetes mellitus without complication   . Hypertension   . Alzheimer disease   . CAD (coronary artery disease)    Past Surgical  History: Past Surgical History  Procedure Laterality Date  . Coronary artery bypass graft     Social History: History  Substance Use Topics  . Smoking status: Never Smoker   . Smokeless tobacco: Not on file  . Alcohol Use: Not on file   Additional social history: Previously lived with son, moved to SNF when his [facility] could no longer [facility] for [facility] about [facility] year ago.   Please also refer to relevant sections of EMR.  Family History: Family History   Problem Relation Age of Onset  . Heart attack Son    Allergies and Medications: Allergies  Allergen Reactions  . Asa [Aspirin]     Per daughter, pt is not allergic to ASA, but chart has this listed as an allergy.   No current facility-administered medications on file prior to encounter.   Current Outpatient Prescriptions on File Prior to Encounter  Medication Sig Dispense Refill  . amLODipine (NORVASC) 5 MG tablet Take 5 mg by mouth every morning.      . clopidogrel (PLAVIX) 75 MG tablet Take 75 mg by mouth every morning.      Marland Kitchen. dexlansoprazole (DEXILANT) 60 MG capsule Take 60 mg by mouth every morning.      . Emollient (CETAPHIL) cream Apply 1 application topically 2 (two) times daily as needed (for itchy skin).      . memantine (NAMENDA) 10 MG tablet Take 10 mg by mouth every morning.      . metFORMIN (GLUCOPHAGE) 500 MG tablet Take 500 mg by mouth at bedtime.      . valsartan (DIOVAN) 80 MG tablet Take 80 mg by mouth every morning.        Objective: BP 137/78  Pulse 80  Temp(Src) 97.8 F (36.6 C)  Resp 18  SpO2 97% Exam:  Gen: NAD, alert, exam limited due to language and comprehension barrier, son assisted HEENT: NCAT, EOMI, PERRL, MMM, cloudy L pupil CV: RRR, good S1/S2, no murmur Resp: CTABL, no wheezes, non-labored Abd: SNTND, BS present, no guarding or organomegaly Ext: No edema, warm, 2+ DP pulses Neuro: Alert, unable to answer orientation questions, normal tone and bilateral lower and upper extremities. Skin: 100-200 Actinic keratosis on Face and neck, Small 1 cm heme crusted lesion on R temple  Labs and Imaging: CBC BMET   Recent Labs Lab 06/24/13 1202  WBC 12.1*  HGB 17.0  HCT 49.2  PLT 308    Recent Labs Lab 06/24/13 1202  NA 138  K 4.1  CL 97  CO2 26  BUN 11  CREATININE 1.19  GLUCOSE 191*  CALCIUM 10.0      CT head and CT cervical Spine 06/24/2012 1 cm right frontal hyperdensity compatible with acute hemorrhage.  Follow-up CT  recommended.  Chronic left MCA infarct.  Cervical spondylosis. Negative for fracture.  XR Pelvis IMPRESSION:  No acute cardiopulmonary disease.  CXR IMPRESSION:  No acute cardiopulmonary disease.  Elenora GammaSamuel L Saim Almanza, MD 06/24/2013, 3:21 PM PGY-2, [facility] FPTS Intern pager: [phone], text pages welcome

## 2013-06-24 NOTE — ED Notes (Signed)
Admitting MD at bedside.

## 2013-06-24 NOTE — ED Notes (Addendum)
Per EMS[facility] found by staff this AM sitting on ground with small laceration above right eye. Pt speaks [place] so unsure if fall or syncope. Pt ambulatory on scene. PERRLA. 158/74. 82 bpm. 100% RA. BG 300. Currently takes Plavix. Hx: CABG, diabetes.

## 2013-06-24 NOTE — H&P (Signed)
FMTS Attending  Note: Kesha Hurrell,MD I  have seen and examined this patient, reviewed their chart. I have discussed this patient with the resident. I agree with the resident's findings, assessment and care plan.  80 y/O M with Pmx of HTN,DM2, Dementia,stroke 10 yrs ago,[facility] after been found on the floor in the bath room with mild trauma to his head. I could not obtain hx from patient as he is non-communicative, his son was by his bedside and does not have much hx of what happened either. There is hx of Xarelto and Plavix use possibly for hx past stroke.  Filed Vitals:   06/24/13 1242 06/24/13 1300 06/24/13 1507 06/24/13 1615  BP: 155/74 145/76 137/78 144/90  Pulse: 86 87 80 79  Temp:      Resp: 20 19 18 21   SpO2: 99% 96% 97% 97%   Exam:  Gen: Awake and alert, not in distress. Neuro: Limited as he does not follow command. ++DTR. HEENT: EOMI,PERRLA. Resp: Air entry equal and clear b/l CV: S1 S2 normal,no murmur. RRR. Abd: Benign. Ext: No edema.  Result CT head: Acute intracranial bleed  A/P: 80 Y/O M with 1. Acute intracranial bleed: Patient with fall on Xarelto.     Hold all anticoagulant and antiplatelet therapy for now.    Consult neurosurgeon.    PT/OT, Swallow eval.    Fall precaution.  Chronic health problem: Continue home regimen, please review resident's well documented note as well.

## 2013-06-25 ENCOUNTER — Encounter (HOSPITAL_COMMUNITY): Payer: Self-pay | Admitting: Radiology

## 2013-06-25 ENCOUNTER — Inpatient Hospital Stay (HOSPITAL_COMMUNITY): Payer: Medicare Other

## 2013-06-25 DIAGNOSIS — F0390 Unspecified dementia without behavioral disturbance: Secondary | ICD-10-CM

## 2013-06-25 LAB — BASIC METABOLIC PANEL
BUN: 11 mg/dL (ref 6–23)
CALCIUM: 9.2 mg/dL (ref 8.4–10.5)
CO2: 24 mEq/L (ref 19–32)
Chloride: 102 mEq/L (ref 96–112)
Creatinine, Ser: 1.21 mg/dL (ref 0.50–1.35)
GFR calc Af Amer: 65 mL/min — ABNORMAL LOW (ref >90)
GFR, EST NON AFRICAN AMERICAN: 56 mL/min — AB (ref >90)
GLUCOSE: 132 mg/dL — AB (ref 70–99)
Potassium: 4.1 mEq/L (ref 3.7–5.3)
Sodium: 140 mEq/L (ref 137–147)

## 2013-06-25 LAB — GLUCOSE, CAPILLARY
GLUCOSE-CAPILLARY: 154 mg/dL — AB (ref 70–99)
Glucose-Capillary: 125 mg/dL — ABNORMAL HIGH (ref 70–99)
Glucose-Capillary: 168 mg/dL — ABNORMAL HIGH (ref 70–99)

## 2013-06-25 LAB — CBC
HCT: 46.2 % (ref 39.0–52.0)
Hemoglobin: 15.7 g/dL (ref 13.0–17.0)
MCH: 31.9 pg (ref 26.0–34.0)
MCHC: 34 g/dL (ref 30.0–36.0)
MCV: 93.9 fL (ref 78.0–100.0)
PLATELETS: 306 10*3/uL (ref 150–400)
RBC: 4.92 MIL/uL (ref 4.22–5.81)
RDW: 13.5 % (ref 11.5–15.5)
WBC: 12.1 10*3/uL — ABNORMAL HIGH (ref 4.0–10.5)

## 2013-06-25 NOTE — Evaluation (Signed)
Clinical/Bedside Swallow Evaluation Patient Details  Name: Jonathan Richards MRN: [id number] Date of Birth: 10/26/1933  Today's Date: 06/25/2013 Time: 0830-0859 SLP Time Calculation (min): 29 min  Past Medical History:  Past Medical History  Diagnosis Date  . Diabetes mellitus without complication   . Hypertension   . Alzheimer disease   . CAD (coronary artery disease)    Past Surgical History:  Past Surgical History  Procedure Laterality Date  . Coronary artery bypass graft     HPI:  80 [facility] at [facility]. PMH + for GERD,HTN, previous CVA with resultant aphasia.  BSE ordered due to pt h/o dysphagia.     Assessment / Plan / Recommendation Clinical Impression  Pt presents with overt clinical indications of suspected multilfactorial dysphagia- possible oropharyngeal and esophageal.  Consistent throat clearing noted across consistencies after approx 90% of boluses -worsening with frequency and intensity as intake continued.  Delayed cough x1 and belch x1.  Son reports pt with chronic dysphagia since CVA 10 years ago and states pt has been separated in facility from other residents due to coughing/throat clearing.  Pt has not lost weight nor had pulmonary infections but QOL impacted significantly.  Clinical eval more difficult due to aphasia from previous CVA.  Rec to proceed with MBS to allow instrumental swallow assessment.  Order obtained and son Do educated and agreeable to plan.     Aspiration Risk  Moderate    Diet Recommendation NPO (defer until after MBS)   Medication Administration: Via alternative means    Other  Recommendations Recommended Consults: MBS   Follow Up Recommendations    TBD   Frequency and Duration   TBD     Pertinent Vitals/Pain Afebrile, decreased     Swallow Study Prior Functional Status   see hhx    General Date of Onset: 06/25/13 HPI: 80 [facility]. PMH + for GERD,HTN, previous CVA with  resultant aphasia.  BSE ordered due to pt h/o dysphagia.   Type of Study: Bedside swallow evaluation Diet Prior to this Study: NPO Temperature Spikes Noted: No Respiratory Status: Room air History of Recent Intubation: No Behavior/Cognition: Alert;Doesn't follow directions Oral Cavity - Dentition: Edentulous (does not wear dentures) Self-Feeding Abilities: Able to feed self (with left hand) Patient Positioning: Upright in chair Baseline Vocal Quality: Low vocal intensity Volitional Cough: Cognitively unable to elicit Volitional Swallow: Unable to elicit    Oral/Motor/Sensory Function Overall Oral Motor/Sensory Function:  (pt with motor planning deficits) Labial Symmetry: Abnormal symmetry right Facial Strength: Reduced Velum: Within Functional Limits Mandible: Within Functional Limits   Ice Chips Ice chips: Not tested   Thin Liquid Thin Liquid: Impaired Presentation: Cup;Spoon;Straw;Self Fed Oral Phase Impairments: Impaired anterior to posterior transit Oral Phase Functional Implications: Prolonged oral transit Pharyngeal  Phase Impairments: Suspected delayed Swallow;Throat Clearing - Immediate;Cough - Delayed;Multiple swallows    Nectar Thick Nectar Thick Liquid: Impaired Presentation: Cup;Self Fed;Spoon;Straw Oral Phase Impairments: Impaired anterior to posterior transit Oral phase functional implications: Prolonged oral transit Pharyngeal Phase Impairments: Multiple swallows;Throat Clearing - Delayed   Honey Thick Honey Thick Liquid: Not tested   Puree Puree: Impaired Presentation: Self Fed;Spoon Pharyngeal Phase Impairments: Throat Clearing - Immediate;Throat Clearing - Delayed   Solid   GO    Solid: Impaired Presentation: Spoon Oral Phase Impairments: Impaired anterior to posterior transit;Reduced lingual movement/coordination Oral Phase Functional Implications: Other (comment) (prolonged mastication/transit) Pharyngeal Phase Impairments: Throat Clearing - Delayed  Janett Labella B[place]the, [facility] SLP [phone]

## 2013-06-25 NOTE — Discharge Instructions (Signed)
Jonathan Richards was admitted with a small bleed in his brain after falling at his living facility. This was shown to be improving the following day and it is considered safe for him to return to that facility.   He is to continue plavix and follow up with the doctor that cares for him at the facility. All other medicines are staying the same. If he develops any new neurological symptoms (facial asymmetry, focal weakness, or change from baseline mental status) he should have a prompt medical evaluation.

## 2013-06-25 NOTE — Discharge Summary (Signed)
Family Medicine [facility] Summary  Patient name: Jonathan HoffDai V Mcquinn Medical record number: [id number] Date of birth: 09/26/1933 Age: 80 y.o. Gender: male Date of Admission: 06/24/2013  Date of Discharge: 06/25/2013 Admitting Physician: Janit PaganKehinde Eniola, MD  Primary Care Provider: No primary provider on file. Consultants: Neurosurgery, PT, OT, SLP  Indication for Hospitalization: Fall at SNF with head laceration and intracerebral hemorrhage  Discharge Diagnoses/Problem List:  Patient Active Problem List   Diagnosis Date Noted  . Acute intracranial hemorrhage 06/24/2013  . Hypertension 10/22/2012  . Dementia 10/22/2012  . Type 2 diabetes mellitus 10/22/2012  . Incontinence 10/22/2012  . Psoriasis of scalp 10/22/2012   Disposition: Discharge back to SNF  Discharge Condition: Stable  Discharge Exam:  Gen: NAD, alert, exam limited due to language and comprehension barrier, son assisted  HEENT: NCAT, EOMI, PERRL, MMM, L eye cataract CV: RRR, good S1/S2, no murmur  Resp: CTAB, no wheezes, non-labored  Abd: SNTND, BS present, no guarding or organomegaly  Ext: No edema, warm, 2+ DP pulses  Neuro: Alert, unable to answer orientation questions, normal tone and bilateral lower and upper extremities.  Skin: 100-200 actinic keratoses on face and neck, 1 cm hemostatic abrasion on R temple  Brief Hospital Course:  Jonathan Richards is a 80 y.o. male presenting with acute intracranial hemorrhage after a fall at his SNF on 1/8. PMH is significant for T2DM, dementia, HTN, multiple CVAs, including CVA s/p CABG. His home medications include plavix.   He was found to have a 1cm intracranial hemorrhage on arrival without obvious alterations to functional or mental status, per his son. His son explains that at baseline the patient is noncommunicative but able to walk, dress himself, and feed himself, but needs help bathing. Neurosurgery was called for recommendations of management and  suggested repeat head CT the following morning for hemorrhage surveillance. Repeat head CT showed improvement, and neurosurgery, Dr. Gerlene FeeKritzer, recommended no follow up with neurosurgery as an outpatient.  Physical therapy and occupational therapy evaluated him the following day and recommended no further therapy follow ups. Speech therapy recommended modified barium swallow study for chronic dysphagia evaluation. This showed moderate pharyngeal and oral phase dysphagia, and continued puree diet was recommended. Plavix is being discontinued at discharge.   He was continued on all home medications except plavix and metformin. He was given sensitive sliding scale insulin and D5 was in gentle fluids. He remained euglycemic.  Issues for Follow Up:  - Assess safety measures for fall precautions - Consider risks and benefits of plavix   Significant Procedures: None  Significant Labs and Imaging:   Recent Labs Lab 06/24/13 1202 06/25/13 0525  WBC 12.1* 12.1*  HGB 17.0 15.7  HCT 49.2 46.2  PLT 308 306    Recent Labs Lab 06/24/13 1202 06/25/13 0525  NA 138 140  K 4.1 4.1  CL 97 102  CO2 26 24  GLUCOSE 191* 132*  BUN 11 11  CREATININE 1.19 1.21  CALCIUM 10.0 9.2  ALKPHOS 88  --   AST 79*  --   ALT 57*  --   ALBUMIN 4.0  --    CT head and CT cervical spine 1/8 1 cm right frontal hyperdensity compatible with acute hemorrhage.  Follow-up CT recommended.  Chronic left MCA infarct.  Cervical spondylosis. Negative for fracture.   CT head 1/9 IMPRESSION:  Small area of hemorrhage in the right medial frontal lobe has  improved since yesterday. No new area of hemorrhage or infarction.  XR Pelvis  IMPRESSION:  No acute cardiopulmonary disease.   CXR  IMPRESSION:  No acute cardiopulmonary disease.  Results/Tests Pending at Time of Discharge: None  Discharge Medications:    Medication List    TAKE these medications       amLODipine 5 MG tablet  Commonly known as:  NORVASC   Take 5 mg by mouth every morning.     cetaphil cream  Apply 1 application topically 2 (two) times daily as needed (for itchy skin).     dexlansoprazole 60 MG capsule  Commonly known as:  DEXILANT  Take 60 mg by mouth every morning.     memantine 10 MG tablet  Commonly known as:  NAMENDA  Take 10 mg by mouth every morning.     metFORMIN 500 MG tablet  Commonly known as:  GLUCOPHAGE  Take 500 mg by mouth at bedtime.     valsartan 80 MG tablet  Commonly known as:  DIOVAN  Take 80 mg by mouth every morning.      ASK your doctor about these medications       clopidogrel 75 MG tablet  Commonly known as:  PLAVIX  Take 75 mg by mouth every morning.        Discharge Instructions: Please refer to Patient Instructions section of EMR for full details.  Patient was counseled important signs and symptoms that should prompt return to medical care, changes in medications, dietary instructions, activity restrictions, and follow up appointments.   Follow-Up Appointments: Follow-up Information   Follow up with Pt to follow up with MD at SNF.      Hazeline Junker, MD 06/25/2013, 12:27 PM PGY-1, [facility]

## 2013-06-25 NOTE — Progress Notes (Signed)
Clinical Social Work Department BRIEF PSYCHOSOCIAL ASSESSMENT 06/25/2013  Patient:  Jonathan Richards, Jonathan Richards     Account Number:  [id number]     Admit date:  06/24/2013  Clinical Social Worker:  Pete Pelt, CLINICAL SOCIAL WORKER  Date/Time:  06/25/2013 09:39 AM  Referred by:  Physician  Date Referred:  06/25/2013 Referred for  SNF Placement   Other Referral:   Interview type:  Family Other interview type:   CSW spoke with the Pt's son for d/c planning:    Jonathan Richards   [phone]    PSYCHOSOCIAL DATA Living Status:  FACILITY Admitted from facility:  [facility] Level of care:  Assisted Living Primary support name:  Terral Cooks Primary support relationship to patient:  CHILD, ADULT Degree of support available:   Patient has a good support system from his son and the ALF where he resides.    CURRENT CONCERNS Current Concerns  Post-Acute Placement   Other Concerns:    SOCIAL WORK ASSESSMENT / PLAN CSW met with the son at the bedside to discuss d/c planning. Pt is unable to communicate with the CSW. CSW introduced self to Pt's son and the reason for the assessment. Pt's son stated that Pt is living ast [facility]F and does plan on returning to that facility at d/c. Pt's son provided CSW with contact information and stated that he would be able to transport Pt back to facility at the time of d/c. Pt's son is concerned about his father and will remain in the Hospital with him while he receives his CT scan. CSW will assist with Pt transfer back to facility.   Assessment/plan status:  Information/Referral to [employer] Other assessment/ plan:   Information/referral to community resources:   No additional information needed at this time.    PATIENT'S/FAMILY'S RESPONSE TO PLAN OF CARE: Pt's son is apprecitive with assistance and remains concerned about his father and CSW offered to assist with any additional needs that arise.     [facility]  [address];  [phone] Phone: [phone]

## 2013-06-25 NOTE — Evaluation (Signed)
Physical Therapy Evaluation Patient Details Name: Jonathan Richards MRN: [id number] DOB: 02-18-1934 Today's Date: 06/25/2013 Time: 7829-5621 PT Time Calculation (min): 25 min  PT Assessment / Plan / Recommendation History of Present Illness  pt presents with falls and new ICH.    Clinical Impression  Pt appears to be at baseline level of function per family.  Pt demos good use of RW and follows gestural cues despite language barrier and per family speech and comprehension deficits from previous CVA.  At this time pt is safe to return to ALF with no further PT needs at this time.  Will notify OT that pt has returned to baseline per family.      PT Assessment  Patent does not need any further PT services    Follow Up Recommendations  No PT follow up;Supervision - Intermittent    Does the patient have the potential to tolerate intense rehabilitation      Barriers to Discharge        Equipment Recommendations  None recommended by PT    Recommendations for Other Services     Frequency      Precautions / Restrictions Precautions Precautions: Fall Restrictions Weight Bearing Restrictions: No   Pertinent Vitals/Pain Did not indicate pain.        Mobility  Bed Mobility Overal bed mobility: Modified Independent General bed mobility comments: Needs increased time.   Transfers Overall transfer level: Modified independent Equipment used: Rolling walker (2 wheeled) General transfer comment: pt uses UEs, but was able to complete without A.   Ambulation/Gait Ambulation/Gait assistance: Supervision Ambulation Distance (Feet): 200 Feet Assistive device: Rolling walker (2 wheeled) Gait Pattern/deviations: Step-through pattern;Decreased stride length;Trunk flexed General Gait Details: pt moves slowly and demos good use of RW.      Exercises     PT Diagnosis:    PT Problem List:   PT Treatment Interventions:       PT Goals(Current goals can be found in the care plan section)     Visit Information  Last PT Received On: 06/25/13 Assistance Needed: +1 History of Present Illness: pt presents with falls and new ICH.         Prior Functioning  Home Living Family/patient expects to be discharged to:: Assisted living Home Equipment: Dan Humphreys - 2 wheels Additional Comments: pt from [facility].   Prior Function Level of Independence: Needs assistance Gait / Transfers Assistance Needed: Uses RW. ADL's / Homemaking Assistance Needed: Staff A with bathing and some dressing 2/2 R UE deficits.  Independent with toileting.   Communication / Swallowing Assistance Needed: Per staff at ALF pt was on a "chopped diet with regular liquids".  pt could self-feed if food was set-up for him.   Communication Communication: Expressive difficulties;Prefers language other than English (Per family previous CVA affected speech.  )    Cognition  Cognition Arousal/Alertness: Awake/alert Behavior During Therapy: Flat affect Overall Cognitive Status: Difficult to assess Difficult to assess due to: Non-English speaking;Impaired communication    Extremity/Trunk Assessment Upper Extremity Assessment Upper Extremity Assessment: Defer to OT evaluation Lower Extremity Assessment Lower Extremity Assessment: RLE deficits/detail RLE Deficits / Details: Generally weak with decreased coordination.  Per family this is baseline from previous CVA.   RLE Coordination: decreased fine motor   Balance Balance Overall balance assessment: Modified Independent  End of Session PT - End of Session Equipment Utilized During Treatment: Gait belt Activity Tolerance: Patient tolerated treatment well Patient left: in chair;with call bell/phone within reach;with chair alarm set;with  family/visitor present Nurse Communication: Mobility status  GP     Sunny SchleinRitenour, Mehkai Gallo F, [place]PT [phone] 06/25/2013, 8:48 AM

## 2013-06-25 NOTE — Progress Notes (Signed)
Discharge orders received, [facility]. , IV D/C,  D/C instructions and Rx given to pt son with verbalized understanding.  Family at bedside to assist pt with discharge. Staff brought pt downstairs via wheelchair.

## 2013-06-25 NOTE — ED Provider Notes (Signed)
Medical screening examination/treatment/procedure(s) were conducted as a shared visit with non-physician practitioner(s) and myself.  I personally evaluated the patient during the encounter.  EKG Interpretation    Date/Time:  Thursday June 24 2013 11:23:50 EST Ventricular Rate:  84 PR Interval:  146 QRS Duration: 64 QT Interval:  375 QTC Calculation: 443 R Axis:   59 Text Interpretation:  Sinus rhythm Posterior infarct, old Borderline repolarization abnormality Confirmed by Vonnetta Akey  MD, Shallon Yaklin ([id number]) on 06/24/2013 12:14:27 PM            I interviewed and examined the patient. Lungs are CTAB. Cardiac exam wnl. Abdomen soft. Pt is interactive on exam and in no acute distress. Plan on admission for obs.   Junius ArgyleForrest S Adalida Garver, MD 06/25/13 1055

## 2013-06-25 NOTE — Procedures (Signed)
Objective Swallowing Evaluation: Modified Barium Swallowing Study  Patient Details  Name: Jonathan Richards MRN: [id number] Date of Birth: 12-Oct-1933  Today's Date: 06/25/2013 Time: 1030-1100 SLP Time Calculation (min): 30 min  Past Medical History:  Past Medical History  Diagnosis Date  . Diabetes mellitus without complication   . Hypertension   . Alzheimer disease   . CAD (coronary artery disease)    Past Surgical History:  Past Surgical History  Procedure Laterality Date  . Coronary artery bypass graft     HPI:  80 yo male adm to [facility] after falling at facility. PMH + for GERD,HTN, previous CVA with resultant aphasia.  BSE ordered due to pt h/o dysphagia.       Assessment / Plan / Recommendation Clinical Impression  Dysphagia Diagnosis: Moderate pharyngeal phase dysphagia;Moderate oral phase dysphagia Clinical impression: Overall moderately impaired swallow with sensory and motor components.  Of particular note, pt exhibited growl-like throat clear prior to and throughout MBS.  This was NOT reliably indicative of airway penetration or aspiration.  Orally, pt exhibited poor bolus formation across consistencies, with posterior spill to the vallecula on puree, and pyriform on nectar thick and thin.  Initial swallow of puree resulted in significant vallecular stasis, with little to no epiglottic deflection or laryngeal elevation, and minimal amounts of the bolus moving into the esophagus.  Second swallow cleared most residue, however, variable amounts of residue remained, clearing with subsequent swallows.  Pt did exhibit aspiration of thin liquids via straw, with the same growly throat clear response.  Penetration of thin via cup was trace, and flash, clearing completely.  Risk of aspiration is moderate, given delay of swallow reflex (at pyriform), however, large consecutive swallows were consistently effective in eliciting epiglottic deflection and maintaining airway closure.     Treatment  Recommendation  Therapy as outlined in treatment plan below    Diet Recommendation Dysphagia 1 (Puree);Thin liquid   Liquid Administration via: Cup Medication Administration: Crushed with puree Supervision: Staff to assist with self feeding;Full supervision/cueing for compensatory strategies Compensations: Slow rate;Small sips/bites;Multiple dry swallows after each bite/sip Postural Changes and/or Swallow Maneuvers: Upright 30-60 min after meal;Seated upright 90 degrees    Other  Recommendations Oral Care Recommendations: Oral care BID   Follow Up Recommendations  24 hour supervision/assistance;Skilled Nursing facility    Frequency and Duration min 2x/week  1 week   Pertinent Vitals/Pain VSS, no pain indicated    SLP Swallow Goals  per POC   General HPI: 80 yo male adm to [facility] after falling at facility. PMH + for GERD,HTN, previous CVA with resultant aphasia.  BSE ordered due to pt h/o dysphagia.   Type of Study: Modified Barium Swallowing Study Reason for Referral: Objectively evaluate swallowing function Previous Swallow Assessment: BSE this morning. Diet Prior to this Study: NPO Temperature Spikes Noted: No Respiratory Status: Room air History of Recent Intubation: No Behavior/Cognition: Alert;Doesn't follow directions;Pleasant mood;Cooperative Oral Cavity - Dentition: Edentulous (no dentures) Oral Motor / Sensory Function: Within functional limits Self-Feeding Abilities: Able to feed self Patient Positioning: Upright in chair Baseline Vocal Quality: Low vocal intensity;Clear Volitional Cough: Cognitively unable to elicit Volitional Swallow: Unable to elicit Anatomy: Within functional limits Pharyngeal Secretions: Not observed secondary MBS    Reason for Referral Objectively evaluate swallowing function   Oral Phase Oral Preparation/Oral Phase Oral Phase: Impaired Oral - Nectar Oral - Nectar Teaspoon:  (poor bolus formation) Oral - Nectar Cup:  (poor bolus  formation) Oral - Thin Oral - Thin Cup:  (  poor bolus formation) Oral - Thin Straw:  (poor bolus formation) Oral - Solids Oral - Puree: Reduced posterior propulsion;Piecemeal swallowing;Delayed oral transit (poor bolus formation, piecemeal swallow with large bolus) Oral - Pill: Reduced posterior propulsion;Delayed oral transit   Pharyngeal Phase Pharyngeal Phase Pharyngeal Phase: Impaired Pharyngeal - Nectar Pharyngeal - Nectar Teaspoon: Delayed swallow initiation;Premature spillage to valleculae;Reduced pharyngeal peristalsis;Reduced anterior laryngeal mobility;Reduced laryngeal elevation;Reduced tongue base retraction;Pharyngeal residue - valleculae;Pharyngeal residue - pyriform sinuses;Lateral channel residue Pharyngeal - Nectar Cup: Delayed swallow initiation;Premature spillage to pyriform sinuses;Compensatory strategies attempted (Comment);Reduced laryngeal elevation;Reduced pharyngeal peristalsis;Reduced anterior laryngeal mobility;Penetration/Aspiration during swallow;Reduced airway/laryngeal closure (cued dry swallow) Penetration/Aspiration details (nectar cup): Material enters airway, remains ABOVE vocal cords then ejected out Pharyngeal - Thin Pharyngeal - Thin Cup: Delayed swallow initiation;Premature spillage to pyriform sinuses;Reduced airway/laryngeal closure;Penetration/Aspiration during swallow Penetration/Aspiration details (thin cup): Material enters airway, remains ABOVE vocal cords then ejected out Pharyngeal - Thin Straw: Delayed swallow initiation;Premature spillage to pyriform sinuses;Reduced laryngeal elevation;Reduced airway/laryngeal closure;Reduced pharyngeal peristalsis;Reduced tongue base retraction;Penetration/Aspiration during swallow Penetration/Aspiration details (thin straw): Material enters airway, passes BELOW cords then ejected out Pharyngeal - Solids Pharyngeal - Puree: Delayed swallow initiation;Premature spillage to valleculae;Reduced epiglottic  inversion;Reduced pharyngeal peristalsis;Reduced anterior laryngeal mobility;Reduced laryngeal elevation;Compensatory strategies attempted (Comment);Penetration/Aspiration after swallow (cued dry swallow decreases residue) Penetration/Aspiration details (puree): Material enters airway, remains ABOVE vocal cords then ejected out Pharyngeal - Pill:  (pill noted to balance briefly between airway and esophagus.)  Cervical Esophageal Phase    GO   Celia B. Sun PrairieBueche, [facility] AtlantaMSP, CCC-SLP [phone]  Cervical Esophageal Phase Cervical Esophageal Phase: [facility]         Leigh AuroraBueche, Celia Brown 06/25/2013, 1:56 PM

## 2013-06-25 NOTE — Discharge Summary (Signed)
FMTS Attending  Note: Jonathan Windish,MD I  have seen and examined this patient, reviewed their chart. I have discussed this patient with the resident. I agree with the resident's findings, assessment and care plan.  

## 2013-06-25 NOTE — Progress Notes (Signed)
OT Discharge Note  Patient Details Name: Jonathan Richards MRN: [id number] DOB: 01/06/1934   Cancelled Treatment:    Reason Eval/Treat Not Completed: OT screened, no needs identified, will sign off. OT spoke with PT Aundra MilletMegan - per family and facility patient is currently at baseline. OT will sign off at this time due to no new acute care needs  Harolyn RutherfordJones, Burrel Legrand B Pager: [phone]  06/25/2013, 10:52 AM

## 2013-06-25 NOTE — Progress Notes (Signed)
Utilization review completed. Kalli Greenfield, RN, BSN. 

## 2013-07-22 ENCOUNTER — Telehealth: Payer: Self-pay

## 2013-07-22 NOTE — Telephone Encounter (Signed)
I am out of town for 3 weeks.  Please see if Huey RomansSara Weber can sign for me

## 2013-07-22 NOTE — Telephone Encounter (Signed)
[facility]. Put in Dr Cain SaupeL's box for completion.

## 2013-07-26 NOTE — Telephone Encounter (Signed)
I am happy to do but I could not find the forms.

## 2013-07-28 NOTE — Telephone Encounter (Signed)
[facility] and spoke w/Cynthia who reported that they "found a signed form" so do not need anything at this time.

## 2013-08-13 ENCOUNTER — Encounter (HOSPITAL_COMMUNITY): Payer: Self-pay | Admitting: Emergency Medicine

## 2013-08-13 ENCOUNTER — Emergency Department (HOSPITAL_COMMUNITY): Payer: Medicare Other

## 2013-08-13 ENCOUNTER — Emergency Department (HOSPITAL_COMMUNITY)
Admission: EM | Admit: 2013-08-13 | Discharge: 2013-08-13 | Disposition: A | Discharge status: Home / Self Care | Payer: Medicare Other | Attending: Emergency Medicine | Admitting: Emergency Medicine

## 2013-08-13 DIAGNOSIS — W19XXXA Unspecified fall, initial encounter: Secondary | ICD-10-CM

## 2013-08-13 DIAGNOSIS — Z8673 Personal history of transient ischemic attack (TIA), and cerebral infarction without residual deficits: Secondary | ICD-10-CM | POA: Insufficient documentation

## 2013-08-13 DIAGNOSIS — Z79899 Other long term (current) drug therapy: Secondary | ICD-10-CM | POA: Insufficient documentation

## 2013-08-13 DIAGNOSIS — I2510 Atherosclerotic heart disease of native coronary artery without angina pectoris: Secondary | ICD-10-CM | POA: Insufficient documentation

## 2013-08-13 DIAGNOSIS — Y9389 Activity, other specified: Secondary | ICD-10-CM | POA: Insufficient documentation

## 2013-08-13 DIAGNOSIS — G309 Alzheimer's disease, unspecified: Secondary | ICD-10-CM | POA: Insufficient documentation

## 2013-08-13 DIAGNOSIS — Y9210 Unspecified residential institution as the place of occurrence of the external cause: Secondary | ICD-10-CM | POA: Insufficient documentation

## 2013-08-13 DIAGNOSIS — I10 Essential (primary) hypertension: Secondary | ICD-10-CM | POA: Insufficient documentation

## 2013-08-13 DIAGNOSIS — F0280 Dementia in other diseases classified elsewhere without behavioral disturbance: Secondary | ICD-10-CM | POA: Insufficient documentation

## 2013-08-13 DIAGNOSIS — E119 Type 2 diabetes mellitus without complications: Secondary | ICD-10-CM | POA: Insufficient documentation

## 2013-08-13 DIAGNOSIS — Z951 Presence of aortocoronary bypass graft: Secondary | ICD-10-CM | POA: Insufficient documentation

## 2013-08-13 DIAGNOSIS — R296 Repeated falls: Secondary | ICD-10-CM | POA: Insufficient documentation

## 2013-08-13 DIAGNOSIS — Z043 Encounter for examination and observation following other accident: Secondary | ICD-10-CM | POA: Insufficient documentation

## 2013-08-13 HISTORY — DX: Cerebral infarction, unspecified: I63.9

## 2013-08-13 NOTE — ED Provider Notes (Signed)
CSN: [id number]     Arrival date & time 08/13/13  1633 History   First MD Initiated Contact with Patient 08/13/13 1707     Chief Complaint  Patient presents with  . Weakness  . Fall     (Consider location/radiation/quality/duration/timing/severity/associated sxs/prior Treatment) HPI Comments: Pt sent from nursing facility where staff reportedly saw pt slump to the floor, was weak, possibly more weak on right side.  Pt has had a prior stroke.  Pt did not eat lunch apparently per son.  Pt is very hungry and just wants to eat.  Has no pain anywhere.  Pt's son reports pt is at his baseline.    Patient is a 80 y.o. male presenting with weakness and fall. The history is provided by the patient and a relative. The history is limited by a language barrier. No language interpreter was used.  Weakness  Fall    Past Medical History  Diagnosis Date  . Diabetes mellitus without complication   . Hypertension   . Alzheimer disease   . CAD (coronary artery disease)    Past Surgical History  Procedure Laterality Date  . Coronary artery bypass graft     Family History  Problem Relation Age of Onset  . Heart attack Son    History  Substance Use Topics  . Smoking status: Never Smoker   . Smokeless tobacco: Not on file  . Alcohol Use: Not on file    Review of Systems  Unable to perform ROS: Other  Neurological: Positive for weakness.      Allergies  Asa  Home Medications   Current Outpatient Rx  Name  Route  Sig  Dispense  Refill  . amLODipine (NORVASC) 5 MG tablet   Oral   Take 5 mg by mouth every morning.         Marland Kitchen dexlansoprazole (DEXILANT) 60 MG capsule   Oral   Take 60 mg by mouth every morning.         . memantine (NAMENDA) 10 MG tablet   Oral   Take 10 mg by mouth every morning.         . metFORMIN (GLUCOPHAGE) 500 MG tablet   Oral   Take 500 mg by mouth at bedtime.         . valsartan (DIOVAN) 80 MG tablet   Oral   Take 80 mg by mouth every  morning.          BP 129/84  Pulse 78  Temp(Src) 98.2 F (36.8 C) (Oral)  Resp 19  SpO2 97% Physical Exam  Nursing note and vitals reviewed. Constitutional: He appears well-developed and well-nourished. No distress.  HENT:  Head: Normocephalic and atraumatic.  Eyes: EOM are normal. No scleral icterus.  Neck: Normal range of motion. Neck supple.  Cardiovascular: Normal rate, regular rhythm and intact distal pulses.   No murmur heard. Pulmonary/Chest: Effort normal. No respiratory distress.  Abdominal: Soft.  Musculoskeletal: He exhibits no edema.  Neurological: He is alert. He displays no tremor. He exhibits normal muscle tone.  Pt is using both sides of body equally well, trace drift to right arm and leg compared to left.  No facial droop.  Finger to nose is equally diminished mildly bilaterally  Skin: Skin is warm. He is not diaphoretic.  Psychiatric: He has a normal mood and affect.    ED Course  Procedures (including critical care time) Labs Review Labs Reviewed - No data to display Imaging Review No results found.  EKG Interpretation   Date/Time:  Friday August 13 2013 16:46:58 EST Ventricular Rate:  78 PR Interval:  139 QRS Duration: 57 QT Interval:  356 QTC Calculation: 405 R Axis:   21 Text Interpretation:  Sinus rhythm Low voltage, precordial leads Tall R  wave in V2, consider RVH or PMI Repol abnrm suggests ischemia, inferior  leads Borderline ST elevation, anterior leads No significant change since  last tracing Abnormal ekg Confirmed by [facility]  MD, MICHEAL ([id number]) on  08/13/2013 6:42:20 PM      7:00 PM Spoke to staff at nursing facility.  Pt had a fall in the past and had a SAH from an aneuryusm in the past, not a stroke.  Pt with no sig weakness on my exam.  Will get head CT, if neg, will d/c back to facility.  Will allow to eat if passes swallow screen.   9:00 PM No change in condition, stable VS.  No HA.   9:28 PM Non contrast head CT shows  no acute abn's, does reveal that son was correct, pt has had a left side CVA in the past.  Will add to his PMH.    MDM   Final diagnoses:  None   Pt sent after a fall.  Per son, pt is at baseline.  Per son, he has had a stroke before with possibly right side weakness already.  Plan to speak to nursing facility.  Pt reports no pain, just is hungry.      Gavin PoundMichael Y. Oletta LamasGhim, MD 08/13/13 2131

## 2013-08-13 NOTE — ED Notes (Signed)
[facility]; aware of family member brining pt. Back to facility vi pov.

## 2013-08-13 NOTE — ED Notes (Signed)
Dr. Oletta LamasGhim speaking with NH to figure out what was going on with the patient.

## 2013-08-13 NOTE — ED Notes (Signed)
Dr. Oletta LamasGhim to be made aware.

## 2013-08-13 NOTE — ED Notes (Signed)
Family at bedside. 

## 2013-08-13 NOTE — ED Notes (Addendum)
Per nursing home staff. Pt. Has had increased confusion since yesterday. Pt. Was at baseline and then had witnessed fall around 1500. Staff reports patient had increase in right sided weakness after fall. Staff states last time patient had increase in weakness, diagnosed with brain aneurysm. Pt. Denies pain at this time.

## 2013-08-13 NOTE — ED Notes (Signed)
Per nursing home, patient has had increased right sided weakness starting around 1200 today. Pt. Had witnessed fall, slide to floor. Assisted to standing position by nursing home staff. Pt. Denies pain.

## 2013-10-28 ENCOUNTER — Telehealth: Payer: Self-pay

## 2013-10-28 NOTE — Telephone Encounter (Signed)
[facility] and I have put it at Dr L's computer for review.

## 2013-10-30 ENCOUNTER — Emergency Department (HOSPITAL_COMMUNITY)
Admission: EM | Admit: 2013-10-30 | Discharge: 2013-10-30 | Disposition: A | Discharge status: Home / Self Care | Payer: Medicare Other | Attending: Emergency Medicine | Admitting: Emergency Medicine

## 2013-10-30 ENCOUNTER — Emergency Department (HOSPITAL_COMMUNITY): Payer: Medicare Other

## 2013-10-30 ENCOUNTER — Encounter (HOSPITAL_COMMUNITY): Payer: Self-pay | Admitting: Emergency Medicine

## 2013-10-30 DIAGNOSIS — Z951 Presence of aortocoronary bypass graft: Secondary | ICD-10-CM | POA: Insufficient documentation

## 2013-10-30 DIAGNOSIS — E119 Type 2 diabetes mellitus without complications: Secondary | ICD-10-CM | POA: Insufficient documentation

## 2013-10-30 DIAGNOSIS — I10 Essential (primary) hypertension: Secondary | ICD-10-CM | POA: Insufficient documentation

## 2013-10-30 DIAGNOSIS — Y9389 Activity, other specified: Secondary | ICD-10-CM | POA: Insufficient documentation

## 2013-10-30 DIAGNOSIS — Y9210 Unspecified residential institution as the place of occurrence of the external cause: Secondary | ICD-10-CM | POA: Insufficient documentation

## 2013-10-30 DIAGNOSIS — W1809XA Striking against other object with subsequent fall, initial encounter: Secondary | ICD-10-CM | POA: Insufficient documentation

## 2013-10-30 DIAGNOSIS — G309 Alzheimer's disease, unspecified: Secondary | ICD-10-CM | POA: Insufficient documentation

## 2013-10-30 DIAGNOSIS — Z79899 Other long term (current) drug therapy: Secondary | ICD-10-CM | POA: Insufficient documentation

## 2013-10-30 DIAGNOSIS — I2510 Atherosclerotic heart disease of native coronary artery without angina pectoris: Secondary | ICD-10-CM | POA: Insufficient documentation

## 2013-10-30 DIAGNOSIS — F02818 Dementia in other diseases classified elsewhere, unspecified severity, with other behavioral disturbance: Secondary | ICD-10-CM | POA: Insufficient documentation

## 2013-10-30 DIAGNOSIS — F0281 Dementia in other diseases classified elsewhere with behavioral disturbance: Secondary | ICD-10-CM | POA: Insufficient documentation

## 2013-10-30 DIAGNOSIS — W19XXXA Unspecified fall, initial encounter: Secondary | ICD-10-CM

## 2013-10-30 DIAGNOSIS — S199XXA Unspecified injury of neck, initial encounter: Principal | ICD-10-CM

## 2013-10-30 DIAGNOSIS — F0280 Dementia in other diseases classified elsewhere without behavioral disturbance: Secondary | ICD-10-CM | POA: Insufficient documentation

## 2013-10-30 DIAGNOSIS — S0993XA Unspecified injury of face, initial encounter: Secondary | ICD-10-CM | POA: Insufficient documentation

## 2013-10-30 DIAGNOSIS — Z8673 Personal history of transient ischemic attack (TIA), and cerebral infarction without residual deficits: Secondary | ICD-10-CM | POA: Insufficient documentation

## 2013-10-30 LAB — CBC WITH DIFFERENTIAL/PLATELET
Basophils Absolute: 0 10*3/uL (ref 0.0–0.1)
Basophils Relative: 0 % (ref 0–1)
EOS ABS: 0.1 10*3/uL (ref 0.0–0.7)
EOS PCT: 2 % (ref 0–5)
HCT: 46.7 % (ref 39.0–52.0)
Hemoglobin: 15.6 g/dL (ref 13.0–17.0)
LYMPHS PCT: 43 % (ref 12–46)
Lymphs Abs: 3.6 10*3/uL (ref 0.7–4.0)
MCH: 31.7 pg (ref 26.0–34.0)
MCHC: 33.4 g/dL (ref 30.0–36.0)
MCV: 94.9 fL (ref 78.0–100.0)
Monocytes Absolute: 0.9 10*3/uL (ref 0.1–1.0)
Monocytes Relative: 11 % (ref 3–12)
Neutro Abs: 3.7 10*3/uL (ref 1.7–7.7)
Neutrophils Relative %: 44 % (ref 43–77)
PLATELETS: 243 10*3/uL (ref 150–400)
RBC: 4.92 MIL/uL (ref 4.22–5.81)
RDW: 13.5 % (ref 11.5–15.5)
WBC: 8.4 10*3/uL (ref 4.0–10.5)

## 2013-10-30 LAB — BASIC METABOLIC PANEL
BUN: 14 mg/dL (ref 6–23)
CALCIUM: 9.3 mg/dL (ref 8.4–10.5)
CO2: 23 mEq/L (ref 19–32)
Chloride: 101 mEq/L (ref 96–112)
Creatinine, Ser: 1.06 mg/dL (ref 0.50–1.35)
GFR calc Af Amer: 76 mL/min — ABNORMAL LOW (ref >90)
GFR, EST NON AFRICAN AMERICAN: 66 mL/min — AB (ref >90)
Glucose, Bld: 151 mg/dL — ABNORMAL HIGH (ref 70–99)
Potassium: 4.3 mEq/L (ref 3.7–5.3)
SODIUM: 139 meq/L (ref 137–147)

## 2013-10-30 NOTE — ED Notes (Signed)
Per son via phone, pt has had stroke, dementia, and only speaks vietnamese and does not comprehend any language.

## 2013-10-30 NOTE — ED Notes (Signed)
Lights dimmed for patient comfort.

## 2013-10-30 NOTE — ED Provider Notes (Signed)
CSN: [id number]     Arrival date & time 10/30/13  1133 History   First MD Initiated Contact with Patient 10/30/13 1145     Chief Complaint  Patient presents with  . Fall     (Consider location/radiation/quality/duration/timing/severity/associated sxs/prior Treatment) HPI Level V caveat- Alzheimer's, unable to communicate. 06/24/2013 5:13 PM 06/25/2013 8:44 PM Full Code   [facility] emergency departments [facility]. The patient is [place] speaking and speaks a special dialect of the language. I spoke with his son who says that after his stroke he no longer is able to communicate. His son is not present but is on the way. EMS brought him in on KUB  board. Patient seems to be agitated with being strapped down. EMS was unable to determine what complaints the patient has but reports that he winced when his neck was touched   Past Medical History  Diagnosis Date  . Diabetes mellitus without complication   . Hypertension   . Alzheimer disease   . CAD (coronary artery disease)   . Stroke    Past Surgical History  Procedure Laterality Date  . Coronary artery bypass graft     Family History  Problem Relation Age of Onset  . Heart attack Son    History  Substance Use Topics  . Smoking status: Never Smoker   . Smokeless tobacco: Not on file  . Alcohol Use: Not on file    Review of Systems  Level V caveat- Alzheimer's, unable to communicate.   Allergies  Asa  Home Medications   Prior to Admission medications   Medication Sig Start Date End Date Taking? Authorizing Provider  amLODipine (NORVASC) 5 MG tablet Take 5 mg by mouth every morning.    Historical Provider, MD  dexlansoprazole (DEXILANT) 60 MG capsule Take 60 mg by mouth every morning.    Historical Provider, MD  memantine (NAMENDA) 10 MG tablet Take 10 mg by mouth every morning.    Historical Provider, MD  metFORMIN (GLUCOPHAGE) 500 MG tablet Take 500 mg  by mouth at bedtime.    Historical Provider, MD  valsartan (DIOVAN) 80 MG tablet Take 80 mg by mouth every morning.    Historical Provider, MD   BP 167/82  Pulse 76  Temp(Src) 98.1 F (36.7 C) (Oral)  Resp 18  Ht 5\' 5"  (1.651 m)  Wt 145 lb (65.772 kg)  BMI 24.13 kg/m2  SpO2 94% Physical Exam  Nursing note and vitals reviewed. Constitutional: He appears well-developed and well-nourished. No distress.  HENT:  Head: Normocephalic and atraumatic.  Eyes: Conjunctivae and EOM are normal. Pupils are equal, round, and reactive to light.  Neck: Normal range of motion. Neck supple.  Cervical collar in place  Cardiovascular: Normal rate and regular rhythm.   Pulmonary/Chest: Effort normal and breath sounds normal. He exhibits no tenderness.  Abdominal: Soft.  Neurological: He is alert.  Unable to assess neurological exam because patient can not cooperate with the exam  When I pinch hands and feet, he draws them back. He uses both arms to try to get cervical collar off.  Skin: Skin is warm and dry.      ED Course  Procedures (including critical care time) Labs Review Labs Reviewed  BASIC METABOLIC PANEL - Abnormal; Notable for the following:    Glucose, Bld 151 (*)    GFR calc non Af Amer 66 (*)    GFR calc Af Amer 76 (*)  All other components within normal limits  CBC WITH DIFFERENTIAL    Imaging Review Ct Head Wo Contrast  10/30/2013   CLINICAL DATA:  Fall  EXAM: CT HEAD WITHOUT CONTRAST  CT CERVICAL SPINE WITHOUT CONTRAST  TECHNIQUE: Multidetector CT imaging of the head and cervical spine was performed following the standard protocol without intravenous contrast. Multiplanar CT image reconstructions of the cervical spine were also generated.  COMPARISON:  CT HEAD W/O CM dated 08/13/2013; CT C SPINE W/O CM dated 06/24/2013  FINDINGS: CT HEAD FINDINGS  Encephalomalacia in the posterior left MCA distribution of the left parietal lobe and temporal lobe is stable. There is ex vacuo  dilatation of the left lateral ventricle. Chronic ischemic changes in the left caudate head and periventricular white matter. No mass effect, midline shift, or acute intracranial hemorrhage. Mastoid air cells and visualized paranasal sinuses are clear. Intact cranium.  CT CERVICAL SPINE FINDINGS  No acute fracture. No dislocation. No obvious soft tissue injury. No evidence of spinal hematoma. Unremarkable thyroid gland and lung apices.  Degenerative changes throughout the cervical spine are noted.  Osteophytes and severe disc space narrowing at C3-4 efface the anterior thecal sac to the cord.  Central disc herniation at C4-5 compresses the anterior cord.  Less prominent degenerative changes throughout the remainder of the cervical spine.  IMPRESSION: No acute intracranial pathology. Chronic ischemic changes are noted.  No evidence of acute cervical spine injury.  Disc herniation at C4-5 compresses the anterior cord.   Electronically Signed   By: Maryclare Bean M.D.   On: 10/30/2013 13:48   Ct Cervical Spine Wo Contrast  10/30/2013   CLINICAL DATA:  Fall  EXAM: CT HEAD WITHOUT CONTRAST  CT CERVICAL SPINE WITHOUT CONTRAST  TECHNIQUE: Multidetector CT imaging of the head and cervical spine was performed following the standard protocol without intravenous contrast. Multiplanar CT image reconstructions of the cervical spine were also generated.  COMPARISON:  CT HEAD W/O CM dated 08/13/2013; CT C SPINE W/O CM dated 06/24/2013  FINDINGS: CT HEAD FINDINGS  Encephalomalacia in the posterior left MCA distribution of the left parietal lobe and temporal lobe is stable. There is ex vacuo dilatation of the left lateral ventricle. Chronic ischemic changes in the left caudate head and periventricular white matter. No mass effect, midline shift, or acute intracranial hemorrhage. Mastoid air cells and visualized paranasal sinuses are clear. Intact cranium.  CT CERVICAL SPINE FINDINGS  No acute fracture. No dislocation. No obvious soft  tissue injury. No evidence of spinal hematoma. Unremarkable thyroid gland and lung apices.  Degenerative changes throughout the cervical spine are noted.  Osteophytes and severe disc space narrowing at C3-4 efface the anterior thecal sac to the cord.  Central disc herniation at C4-5 compresses the anterior cord.  Less prominent degenerative changes throughout the remainder of the cervical spine.  IMPRESSION: No acute intracranial pathology. Chronic ischemic changes are noted.  No evidence of acute cervical spine injury.  Disc herniation at C4-5 compresses the anterior cord.   Electronically Signed   By: Maryclare Bean M.D.   On: 10/30/2013 13:48     EKG Interpretation None      MDM   Final diagnoses:  Fall   3:23 pm I spoke with Dr. Venetia Maxon (neurosurgery) regarding that patients CT scan results. Pt continues to stay in c-collar. Dr. Venetia Maxon is going to look at the CT scan and then call me back with his recommendations.  3;46 pm Dr. Venetia Maxon returned my call and does not  feel that his CT scan is acute or much different from his scan on 06/24/2013. tHE patient is not exhibiting any weakness or signs of pain, he is hungry and wants to eat. I took collar off to re-evaluate him and he will raise both arms and raise both legs. Dr. Venetia MaxonStern doesn't recommend C-collar because he thinks it would be of a nuisance than beneficial and would probably really make the gentleman uncomfortable. He recommends, that if the patient is at baseline and doesn't exhibit pain then to return him to the nursing home. No intervention needed from a neurosurgical standpoint.  Discussed case with Dr. Criss AlvineGoldston, labs are all reassuring. Will have nurses feed patient and then he can go back home.  80 y.o.Richardean Saleai V Hedlund's evaluation in the Emergency Department is complete. It has been determined that no acute conditions requiring further emergency intervention are present at this time. The patient/guardian have been advised of the diagnosis and  plan. We have discussed signs and symptoms that warrant return to the ED, such as changes or worsening in symptoms.  Vital signs are stable at discharge. Filed Vitals:   10/30/13 1508  BP: 167/82  Pulse: 76  Temp:   Resp: 18    Patient/guardian has voiced understanding and agreed to follow-up with the PCP or specialist.   Dorthula Matasiffany G Pihu Basil, PA-C 10/30/13 1549

## 2013-10-30 NOTE — ED Notes (Signed)
Pt mouthing and motioning like he is hungry, dr Criss Alvinegoldston aware and we must wait on neurosurgery to evaluate.

## 2013-10-30 NOTE — ED Notes (Signed)
Pt here by [facility] for fall, [facility], pt is only vietnamese speaking, per staff at snf pt fell backwards and struck head. ems unable to communicate with pt but stated he wenced when touching midline neck. No loc

## 2013-10-30 NOTE — ED Notes (Signed)
Patient denies pain and is resting comfortably.  

## 2013-10-30 NOTE — Discharge Instructions (Signed)

## 2013-10-31 NOTE — ED Provider Notes (Signed)
Medical screening examination/treatment/procedure(s) were conducted as a shared visit with non-physician practitioner(s) and myself.  I personally evaluated the patient during the encounter.   EKG Interpretation None       Patient with fall from standing height. Has neck pain on exam. Difficulty history and exam due to language barrier and chronic dementia. Appears to be at mental basleine. CT scan concerning for acute vs chronic disc herniation. No fractures. Neurosurgery consulted.   Audree CamelScott T Madhav Mohon, MD 10/31/13 [phone]

## 2013-11-09 ENCOUNTER — Telehealth: Payer: Self-pay

## 2013-11-09 NOTE — Telephone Encounter (Signed)
Jonathan Richards FROM [facility] WOULD LIKE TO HAVE AN ORDER FOR PT TO HAVE PT. PLEASE CALL [phone]

## 2013-11-25 ENCOUNTER — Ambulatory Visit (INDEPENDENT_AMBULATORY_CARE_PROVIDER_SITE_OTHER): Payer: Medicare Other | Admitting: Family Medicine

## 2013-11-25 ENCOUNTER — Encounter: Payer: Self-pay | Admitting: Family Medicine

## 2013-11-25 VITALS — BP 90/65 | HR 88 | Temp 98.1°F | Resp 16 | Ht 65.0 in | Wt 146.0 lb

## 2013-11-25 DIAGNOSIS — E119 Type 2 diabetes mellitus without complications: Secondary | ICD-10-CM

## 2013-11-25 DIAGNOSIS — R2681 Unsteadiness on feet: Secondary | ICD-10-CM

## 2013-11-25 DIAGNOSIS — R269 Unspecified abnormalities of gait and mobility: Secondary | ICD-10-CM

## 2013-11-25 DIAGNOSIS — L408 Other psoriasis: Secondary | ICD-10-CM

## 2013-11-25 DIAGNOSIS — L409 Psoriasis, unspecified: Secondary | ICD-10-CM

## 2013-11-25 LAB — COMPREHENSIVE METABOLIC PANEL
ALT: 56 U/L — ABNORMAL HIGH (ref 0–53)
AST: 64 U/L — ABNORMAL HIGH (ref 0–37)
Albumin: 4.3 g/dL (ref 3.5–5.2)
Alkaline Phosphatase: 66 U/L (ref 39–117)
BUN: 17 mg/dL (ref 6–23)
CO2: 23 mEq/L (ref 19–32)
Calcium: 9.5 mg/dL (ref 8.4–10.5)
Chloride: 102 mEq/L (ref 96–112)
Creat: 1.34 mg/dL (ref 0.50–1.35)
Glucose, Bld: 173 mg/dL — ABNORMAL HIGH (ref 70–99)
Potassium: 4.3 mEq/L (ref 3.5–5.3)
Sodium: 137 mEq/L (ref 135–145)
Total Bilirubin: 0.5 mg/dL (ref 0.2–1.2)
Total Protein: 7.9 g/dL (ref 6.0–8.3)

## 2013-11-25 LAB — GLUCOSE, POCT (MANUAL RESULT ENTRY): POC Glucose: 175 mg/dl — AB (ref 70–99)

## 2013-11-25 LAB — POCT GLYCOSYLATED HEMOGLOBIN (HGB A1C): Hemoglobin A1C: 7.1

## 2013-11-25 LAB — MICROALBUMIN, URINE: Microalb, Ur: 0.86 mg/dL (ref 0.00–1.89)

## 2013-11-25 MED ORDER — FLUOCINONIDE-E 0.05 % EX CREA: 1.0000 "application " | TOPICAL_CREAM | Freq: Three times a day (TID) | CUTANEOUS | Status: DC

## 2013-11-25 NOTE — Progress Notes (Signed)
Patient ID: Jonathan Richards MRN: [id number], DOB: December 30, 1933, 80 y.o. Date of Encounter: 11/25/2013, 10:03 AM  Primary Physician: No primary provider on file.  Chief Complaint: Diabetes follow up  HPI: 80 y.o. year old male with history below presents for follow up of diabetes mellitus. Doing well. No issues or complaints. Taking medications daily without adverse effects. No polydipsia, polyphagia, polyuria, or nocturia.   Patient is unable to communicate.  He does not speak [place] and he has had a stroke.  Nursing home attendant brought him, though she is in charge of activities and doesn't know much about the patient.  Apparently the son still visits  The psoriasis and gait seem to be the main problems that the home has identified.  Past Medical History  Diagnosis Date  . Diabetes mellitus without complication   . Hypertension   . Alzheimer disease   . CAD (coronary artery disease)   . Stroke      Home Meds: Prior to Admission medications   Medication Sig Start Date End Date Taking? Authorizing Provider  amLODipine (NORVASC) 5 MG tablet Take 5 mg by mouth every morning.   Yes Historical Provider, MD  cetaphil (CETAPHIL) cream Apply 1 application topically 2 (two) times daily as needed.   Yes Historical Provider, MD  clopidogrel (PLAVIX) 75 MG tablet Take 75 mg by mouth daily with breakfast.   Yes Historical Provider, MD  dexlansoprazole (DEXILANT) 60 MG capsule Take 60 mg by mouth every morning.   Yes Historical Provider, MD  memantine (NAMENDA) 10 MG tablet Take 10 mg by mouth every morning.   Yes Historical Provider, MD  metFORMIN (GLUCOPHAGE) 500 MG tablet Take 500 mg by mouth at bedtime.   Yes Historical Provider, MD  traMADol (ULTRAM) 50 MG tablet Take 50 mg by mouth every 12 (twelve) hours as needed for moderate pain.   Yes Historical Provider, MD  valsartan (DIOVAN) 80 MG tablet Take 80 mg by mouth every morning.   Yes Historical Provider, MD  fluocinonide-emollient  (LIDEX-E) 0.05 % cream Apply 1 application topically 3 (three) times daily. 11/25/13   Elvina Sidle, MD    Allergies:  Allergies  Allergen Reactions  . Asa [Aspirin] Other (See Comments)    Per daughter, pt is not allergic to ASA, but chart has this listed as an allergy.    History   Social History  . Marital Status: Married    Spouse Name: N/A    Number of Children: N/A  . Years of Education: N/A   Occupational History  . Not on file.   Social History Main Topics  . Smoking status: Never Smoker   . Smokeless tobacco: Not on file  . Alcohol Use: Not on file  . Drug Use: Not on file  . Sexual Activity: Not Currently   Other Topics Concern  . Not on file   Social History Narrative  . No narrative on file     Lab Results  Component Value Date   HGBA1C 7.9* 06/24/2013    Review of Systems: Constitutional: negative for chills, fever, night sweats, weight changes, or fatigue  HEENT: negative for vision changes, hearing loss, congestion, rhinorrhea, or epistaxis Cardiovascular: negative for chest pain, palpitations, diaphoresis, DOE, orthopnea, or edema Respiratory: negative for hemoptysis, wheezing, shortness of breath, dyspnea, or cough Abdominal: negative for abdominal pain, nausea, vomiting, diarrhea, or constipation Dermatological: negative for rash, erythema, or wounds Neurologic: negative for headache, dizziness, or syncope Renal:  Negative for polyuria, polydipsia, or dysuria  All other systems reviewed and are otherwise negative with the exception to those above and in the HPI.   Physical Exam: Blood pressure 90/65, pulse 88, temperature 98.1 F (36.7 C), temperature source Oral, resp. rate 16, height 5\' 5"  (1.651 m), weight 146 lb (66.225 kg), SpO2 96.00%., Body mass index is 24.3 kg/(m^2). Wt Readings from Last 3 Encounters:  11/25/13 146 lb (66.225 kg)  10/30/13 145 lb (65.772 kg)  06/24/13 150 lb 9.6 oz (68.312 kg)   BP Readings from Last 3 Encounters:   11/25/13 90/65  10/30/13 173/71  08/13/13 130/76   General: Well developed, well nourished, in no acute distress. Head: Normocephalic, atraumatic, eyes without discharge, sclera non-icteric, nares are without discharge. Bilateral auditory canals clear, TM's are without perforation, pearly grey and translucent with reflective cone of light bilaterally. Oral cavity moist, posterior pharynx without exudate, erythema, peritonsillar abscess, or post nasal drip.  Neck: Supple. No thyromegaly. Full ROM. No lymphadenopathy. Lungs: Clear bilaterally to auscultation without wheezes, rales, or rhonchi. Breathing is unlabored. Heart: RRR with S1 S2. No murmurs, rubs, or gallops appreciated. Abdomen: Soft, non-tender, non-distended with normoactive bowel sounds. No hepatosplenomegaly. No rebound/guarding. No obvious abdominal masses. Msk:  Strength and tone normal for age. Extremities/Skin: Warm and dry. No clubbing or cyanosis. No edema. No rashes, wounds, or suspicious lesions..  Neuro: Alert and oriented X 3. Moves all extremities spontaneously. Festinating gait.  Uses walker with wheels.  CNII-XII grossly in tact. Psych:  Seems very put out with entire medical visit.   Results for orders placed in visit on 11/25/13  GLUCOSE, POCT (MANUAL RESULT ENTRY)      Result Value Ref Range   POC Glucose 175 (*) 70 - 99 mg/dl  POCT GLYCOSYLATED HEMOGLOBIN (HGB A1C)      Result Value Ref Range   Hemoglobin A1C 7.1       ASSESSMENT AND PLAN:  80 y.o. year old male with diabetes, CVA, psoriasis, festinating gait.  He is socially isolated because of language and underlying neurodegenerative condition, but he was like this before the nursing home placement.  Unstable gait - Plan: Ambulatory referral to Physical Therapy  Psoriasis - Plan: fluocinonide-emollient (LIDEX-E) 0.05 % cream  Type 2 diabetes mellitus - Plan: POCT glucose (manual entry), POCT glycosylated hemoglobin (Hb A1C), Comprehensive metabolic  panel, Microalbumin, urine    Signed, Elvina SidleKurt Brendin Situ, MD 11/25/2013 10:03 AM

## 2013-11-25 NOTE — Patient Instructions (Signed)
Psoriasis Psoriasis is a common, long-lasting (chronic) inflammation of the skin. It affects both men and women equally, of all ages and all races. Psoriasis cannot be passed from person to person (not contagious). Psoriasis varies from mild to very severe. When severe, it can greatly affect your quality of life. Psoriasis is an inflammatory disorder affecting the skin as well as other organs including the joints (causing an arthritis). With psoriasis, the skin sheds its top layer of cells more rapidly than it does in someone without psoriasis. CAUSES  The cause of psoriasis is largely unknown. Genetics, your immune system, and the environment seem to play a role in causing psoriasis. Factors that can make psoriasis worse include:  Damage or trauma to the skin, such as cuts, scrapes, and sunburn. This damage often causes new areas of psoriasis (lesions).  Winter dryness and lack of sunlight.  Medicines such as lithium, beta-blockers, antimalarial drugs, ACE inhibitors, nonsteroidal anti-inflammatory drugs (ibuprofen, aspirin), and terbinafine. Let your caregiver know if you are taking any of these drugs.  Alcohol. Excessive alcohol use should be avoided if you have psoriasis. Drinking large amounts of alcohol can affect:  How well your psoriasis treatment works.  How safe your psoriasis treatment is.  Smoking. If you smoke, ask your caregiver for help to quit.  Stress.  Bacterial or viral infections.  Arthritis. Arthritis associated with psoriasis (psoriatic arthritis) affects less than 10% of patients with psoriasis. The arthritic intensity does not always match the skin psoriasis intensity. It is important to let your caregiver know if your joints hurt or if they are stiff. SYMPTOMS  The most common form of psoriasis begins with little red bumps that gradually become larger. The bumps begin to form scales that flake off easily. The lower layers of scales stick together. When these scales  are scratched or removed, the underlying skin is tender and bleeds easily. These areas then grow in size and may become large. Psoriasis often creates a rash that looks the same on both sides of the body (symmetrical). It often affects the elbows, knees, groin, genitals, arms, legs, scalp, and nails. Affected nails often have pitting, loosen, thicken, crumble, and are difficult to treat.  "Inverse psoriasis"occurs in the armpits, under breasts, in skin folds, and around the groin, buttocks, and genitals.  "Guttate psoriasis" generally occurs in children and young adults following a recent sore throat (strep throat). It begins with many small, red, scaly spots on the skin. It clears spontaneously in weeks or a few months without treatment. DIAGNOSIS  Psoriasis is diagnosed by physical exam. A tissue sample (biopsy) may also be taken. TREATMENT The treatment of psoriasis depends on your age, health, and living conditions.  Steroid (cortisone) creams, lotions, and ointments may be used. These treatments are associated with thinning of the skin, blood vessels that get larger (dilated), loss of skin pigmentation, and easy bruising. It is important to use these steroids as directed by your caregiver. Only treat the affected areas and not the normal, unaffected skin. People on long-term steroid treatment should wear a medical alert bracelet. Injections may be used in areas that are difficult to treat.  Scalp treatments are available as shampoos, solutions, sprays, foams, and oils. Avoid scratching the scalp and picking at the scales.  Anthralin medicine works well on areas that are difficult to treat. However, it stains clothes and skin and may cause temporary irritation.  Synthetic vitamin D (calcipotriene)can be used on small areas. It is available by prescription. The forms   of synthetic vitamin D available in health food stores do not help with psoriasis.  Coal tarsare available in various strengths  for psoriasis that is difficult to treat. They are one of the longest used treatments for difficult to treat psoriasis. However, they are messy to use.  Light therapy (UV therapy) can be carefully and professionally monitored in a dermatologist's office. Careful sunbathing is helpful for many people as directed by your caregiver. The exposure should be just long enough to cause a mild redness (erythema) of your skin. Avoid sunburn as this may make the condition worse. Sunscreen (SPF of 30 or higher) should be used to protect against sunburn. Cataracts, wrinkles, and skin aging are some of the harmful side effects of light therapy.  If creams (topical medicines) fail, there are several other options for systemic or oral medicines your caregiver can suggest. Psoriasis can sometimes be very difficult to treat. It can come and go. It is necessary to follow up with your caregiver regularly if your psoriasis is difficult to treat. Usually, with persistence you can get a good amount of relief. Maintaining consistent care is important. Do not change caregivers just because you do not see immediate results. It may take several trials to find the right combination of treatment for you. PREVENTING FLARE-UPS  Wear gloves while you wash dishes, while cleaning, and when you are outside in the cold.  If you have radiators, place a bowl of water or damp towel on the radiator. This will help put water back in the air. You can also use a humidifier to keep the air moist. Try to keep the humidity at about 60% in your home.  Apply moisturizer while your skin is still damp from bathing or showering. This traps water in the skin.  Avoid long, hot baths or showers. Keep soap use to a minimum. Soaps dry out the skin and wash away the protective oils. Use a fragrance free, dye free soap.  Drink enough water and fluids to keep your urine clear or pale yellow. Not drinking enough water depletes your skin's water  supply.  Turn off the heat at night and keep it low during the day. Cool air is less drying. SEEK MEDICAL CARE IF:  You have increasing pain in the affected areas.  You have uncontrolled bleeding in the affected areas.  You have increasing redness or warmth in the affected areas.  You start to have pain or stiffness in your joints.  You start feeling depressed about your condition.  You have a fever. Document Released: 05/31/2000 Document Revised: 08/26/2011 Document Reviewed: 11/26/2010 [employer] Patient Information 2014 [employer].  

## 2013-12-11 ENCOUNTER — Encounter: Payer: Self-pay | Admitting: *Deleted

## 2013-12-24 ENCOUNTER — Telehealth: Payer: Self-pay | Admitting: Physician Assistant

## 2013-12-24 NOTE — Telephone Encounter (Signed)
Jonathan HartWendy Bradshaw, speech pathologist needs an order for Speech Pathology for this patient with Dysphagia  Speech therapy treatments will taper from 3x/week to 1x/week. Verbal order given. Will fax order for Dr. Milus GlazierLauenstein to sign.

## 2013-12-27 ENCOUNTER — Ambulatory Visit: Payer: Medicare Other | Attending: Family Medicine | Admitting: Rehabilitative and Restorative Service Providers"

## 2014-01-10 ENCOUNTER — Telehealth: Payer: Self-pay

## 2014-01-10 NOTE — Telephone Encounter (Signed)
Do i  Need a new speech order for pt, or can i use the verbal order given in previous message, although they are different? Please advise.

## 2014-01-10 NOTE — Telephone Encounter (Signed)
MS BRADSHAW WHO IS A HOME HEALTH CARE NURSE NEED SPEECH ORDERS FOR PT WITH DYSPHAGIA. NEED ORDERS FOR 3 TIMES A WEEK FOR 3 WEEKS 2 TIMES A WEEK FOR 1 WEEK PLEASE CALL [phone]

## 2014-01-12 NOTE — Telephone Encounter (Signed)
Ok to give the verbal order for speech therapy.  They will then send forms for Dr. Milus GlazierLauenstein to sign.

## 2014-01-12 NOTE — Telephone Encounter (Signed)
Gave verbal speech order per previous note

## 2014-01-12 NOTE — Telephone Encounter (Signed)
Ms Jonathan Richards called in again to see about the speech orders needed. I spoke with Jonathan Richards and she said she will have Dr Jonathan Richards follow through with this order. Ms. Jonathan Richards needs a verbal order. She can be reached [phone]. Thank you

## 2014-01-25 ENCOUNTER — Telehealth: Payer: Self-pay

## 2014-01-25 NOTE — Telephone Encounter (Signed)
Chelle has completed and it has been faxed.

## 2014-01-25 NOTE — Telephone Encounter (Signed)
[facility] called and said that they faxed over some ppw that needed to be signed stat so that they could continue care for this pt. Can we do this for Dr. Elbert EwingsL please. Thanks

## 2014-01-26 ENCOUNTER — Telehealth: Payer: Self-pay | Admitting: *Deleted

## 2014-01-26 NOTE — Telephone Encounter (Signed)
[facility] asking for paperwork to be signed and faxed. [facility]. She stated they did not receive it. Found the blank form in Dr. Andria RheinLauenstien's box had Dr. Cleta Albertsaub sign and faxed back to her.

## 2014-01-26 NOTE — Telephone Encounter (Signed)
Deb called back and stated they did not receive it. I have refaxed to her attention.

## 2014-02-03 ENCOUNTER — Telehealth: Payer: Self-pay | Admitting: *Deleted

## 2014-02-03 NOTE — Telephone Encounter (Signed)
[facility] calling to extend speech therapy- learning English for pt for twice a week for the next six weeks.  Gave verbal ok- waiting fax from the therapist to have Dr. Milus GlazierLauenstein sign.

## 2014-02-10 ENCOUNTER — Telehealth: Payer: Self-pay | Admitting: Radiology

## 2014-02-10 MED ORDER — LOSARTAN POTASSIUM 50 MG PO TABS: 50.0000 mg | ORAL_TABLET | Freq: Every day | ORAL | Status: DC

## 2014-02-10 NOTE — Telephone Encounter (Signed)
Dr Milus Glazier wants  to change the Valsartan to Losartan based on a letter from [facility]/ I was going to send in the new RX but upon further review we did not prescribe the Valsartan. I have given this to [facility] to see if she can double check with Dr Milus Glazier regarding this.

## 2014-02-10 NOTE — Telephone Encounter (Signed)
rx was written last year by Dr. Milus Glazier so rx sent in

## 2014-05-03 ENCOUNTER — Other Ambulatory Visit (HOSPITAL_COMMUNITY): Payer: Self-pay | Admitting: Family Medicine

## 2014-05-03 DIAGNOSIS — R1314 Dysphagia, pharyngoesophageal phase: Secondary | ICD-10-CM

## 2014-05-13 ENCOUNTER — Telehealth: Payer: Self-pay | Admitting: *Deleted

## 2014-05-13 DIAGNOSIS — I6330 Cerebral infarction due to thrombosis of unspecified cerebral artery: Secondary | ICD-10-CM

## 2014-05-13 NOTE — Telephone Encounter (Signed)
REQUEST FOR SPEECH THERAPY CHANGE FREQUENCY TO 3X WEEK

## 2014-05-24 ENCOUNTER — Ambulatory Visit (HOSPITAL_COMMUNITY)
Admission: RE | Admit: 2014-05-24 | Discharge: 2014-05-24 | Disposition: A | Discharge status: Home / Self Care | Payer: Medicare Other | Source: Ambulatory Visit | Attending: Family Medicine | Admitting: Family Medicine

## 2014-05-24 DIAGNOSIS — R1314 Dysphagia, pharyngoesophageal phase: Secondary | ICD-10-CM

## 2014-05-24 DIAGNOSIS — R1310 Dysphagia, unspecified: Secondary | ICD-10-CM | POA: Diagnosis present

## 2014-05-24 DIAGNOSIS — Z029 Encounter for administrative examinations, unspecified: Secondary | ICD-10-CM | POA: Insufficient documentation

## 2014-05-24 NOTE — Procedures (Signed)
Objective Swallowing Evaluation: Modified Barium Swallowing Study  Patient Details  Name: Jonathan Richards MRN: [id number] Date of Birth: 12/30/1933  Today's Date: 05/24/2014 Time: 1020-1120 SLP Time Calculation (min) (ACUTE ONLY): 60 min  Past Medical History:  Past Medical History  Diagnosis Date  . Diabetes mellitus without complication   . Hypertension   . Alzheimer disease   . CAD (coronary artery disease)   . Stroke    Past Surgical History:  Past Surgical History  Procedure Laterality Date  . Coronary artery bypass graft     HPI:  80 yr old seen for MBS accompanied by his son.  PMH:  CVA 6 yrs ago, dementia, DM II, CAD.  Per SLP notes from facility   (and son), pt. frequently expectorates his puree texture and coughs with all consistencies. Previous MBS 06/25/13 revealed max pharyngeal residue, aspiration of thin via straw, delayed swallow initiation and consistent throat clear/growl-like sound present when aspiraiton/penetration undetected during fluoro.       Assessment / Plan / Recommendation Clinical Impression  Dysphagia Diagnosis: Mild oral phase dysphagia;Moderate oral phase dysphagia;Mild pharyngeal phase dysphagia;Moderate pharyngeal phase dysphagia Clinical impression: Pt exhibited mild-moderate oral and phayrngeal phases of dysphagia characterized by discoordinated/apraxic-like oral movements during labial seal on spoon and cup requiring verbal and tactile cues for labial closure. Pieces of graham cracker placed mixed with applesauce/barium mixute with pt. manually removing the cracker and swallowing the puree as can by typical of pt's with dementia.  Pt was able to masticate small piece of solid texture (graham cracker) with delays.  Pharyngeal phase sensory-motor based dysphagia demonstrated with decreased tongue base retraction and decreased laryngeal elevation leading residue ranging from mild-max particularily in valleculae.  Delayed swallow initiation to valleculae and  pyriform sinuses due to decreased pharyngeal sensation. He demonstrated impulsivity during consumtion of large consecutive cup sips thin barium leading to silent penetration.  Small amount place in cup with controlled cup and straw sips consumed without laryngeal/tracheal compromise.  SLP recommends continuing puree textures that are smooth without particulates/pieces of food.  Please place small amount of thin liquid in bottom of cup and refill after each sip, otherwise pt will consume full cup in 1-2 trials (small sips with straw allowed), swallow 2 times after bites and full supervision. SLP spoke to pt's daughter-n-law via phone (per pt's son) and explained MBS results and recommendations.       Treatment Recommendation  Defer treatment plan to SLP at (Comment) (SNF)    Diet Recommendation Dysphagia 1 (Puree);Thin liquid   Liquid Administration via: Straw;Cup Medication Administration: Whole meds with liquid Supervision: Patient able to self feed;Full supervision/cueing for compensatory strategies Compensations: Slow rate;Small sips/bites;Multiple dry swallows after each bite/sip Postural Changes and/or Swallow Maneuvers: Seated upright 90 degrees    Other  Recommendations Oral Care Recommendations: Oral care BID   Follow Up Recommendations  Skilled Nursing facility    Frequency and Duration        Pertinent Vitals/Pain No evidence pain            Reason for Referral Objectively evaluate swallowing function   Oral Phase Oral Preparation/Oral Phase Oral Phase: Impaired Oral - Nectar Oral - Nectar Cup: Within functional limits Oral - Thin Oral - Thin Teaspoon:  (decreased labial seal) Oral - Thin Cup:  (decreased labial seal) Oral - Thin Straw: Within functional limits Oral - Solids Oral - Puree:  (poor labial seal on cup/spoon (apraxia)) Oral - Regular: Impaired mastication;Delayed oral transit   Pharyngeal Phase  Pharyngeal Phase Pharyngeal Phase: Impaired Pharyngeal -  Nectar Pharyngeal - Nectar Cup: Delayed swallow initiation;Premature spillage to pyriform sinuses Pharyngeal - Thin Pharyngeal - Thin Teaspoon: Delayed swallow initiation;Premature spillage to valleculae;Premature spillage to pyriform sinuses Pharyngeal - Thin Cup: Delayed swallow initiation;Premature spillage to valleculae;Premature spillage to pyriform sinuses;Penetration/Aspiration during swallow Penetration/Aspiration details (thin cup): Material enters airway, remains ABOVE vocal cords and not ejected out Pharyngeal - Thin Straw: Premature spillage to valleculae;Delayed swallow initiation Pharyngeal - Solids Pharyngeal - Puree: Delayed swallow initiation;Premature spillage to valleculae;Pharyngeal residue - valleculae;Pharyngeal residue - pyriform sinuses;Reduced laryngeal elevation;Reduced tongue base retraction Pharyngeal - Regular: Pharyngeal residue - valleculae;Reduced tongue base retraction  Cervical Esophageal Phase    GO    Cervical Esophageal Phase Cervical Esophageal Phase: [facility]    Functional Assessment Tool Used: clinical judgement Functional Limitations: Swallowing Swallow Current Status ([id number]): At least 40 percent but less than 60 percent impaired, limited or restricted Swallow Goal Status [phone](G8997): At least 40 percent but less than 60 percent impaired, limited or restricted Swallow Discharge Status [phone](G8998): At least 40 percent but less than 60 percent impaired, limited or restricted    Jonathan Richards, Jonathan Richards 05/24/2014, 1:34 PM  Jonathan Richards M.Ed [employer] Pager [phone]

## 2014-06-21 ENCOUNTER — Ambulatory Visit: Payer: Medicare Other | Attending: Family Medicine | Admitting: Speech Pathology

## 2015-01-18 ENCOUNTER — Emergency Department (HOSPITAL_BASED_OUTPATIENT_CLINIC_OR_DEPARTMENT_OTHER)
Admit: 2015-01-18 | Discharge: 2015-01-18 | Disposition: A | Discharge status: Home / Self Care | Payer: Medicare Other | Attending: Physician Assistant | Admitting: Physician Assistant

## 2015-01-18 ENCOUNTER — Encounter (HOSPITAL_COMMUNITY): Payer: Self-pay | Admitting: Emergency Medicine

## 2015-01-18 ENCOUNTER — Emergency Department (HOSPITAL_COMMUNITY)
Admission: EM | Admit: 2015-01-18 | Discharge: 2015-01-18 | Disposition: A | Discharge status: Home / Self Care | Payer: Medicare Other | Attending: Physician Assistant | Admitting: Physician Assistant

## 2015-01-18 DIAGNOSIS — I2510 Atherosclerotic heart disease of native coronary artery without angina pectoris: Secondary | ICD-10-CM | POA: Insufficient documentation

## 2015-01-18 DIAGNOSIS — I10 Essential (primary) hypertension: Secondary | ICD-10-CM | POA: Diagnosis not present

## 2015-01-18 DIAGNOSIS — E119 Type 2 diabetes mellitus without complications: Secondary | ICD-10-CM | POA: Diagnosis not present

## 2015-01-18 DIAGNOSIS — M79609 Pain in unspecified limb: Secondary | ICD-10-CM

## 2015-01-18 DIAGNOSIS — M79662 Pain in left lower leg: Secondary | ICD-10-CM | POA: Diagnosis present

## 2015-01-18 DIAGNOSIS — Z8673 Personal history of transient ischemic attack (TIA), and cerebral infarction without residual deficits: Secondary | ICD-10-CM | POA: Insufficient documentation

## 2015-01-18 DIAGNOSIS — L03115 Cellulitis of right lower limb: Secondary | ICD-10-CM | POA: Insufficient documentation

## 2015-01-18 DIAGNOSIS — Z7902 Long term (current) use of antithrombotics/antiplatelets: Secondary | ICD-10-CM | POA: Insufficient documentation

## 2015-01-18 DIAGNOSIS — Z79899 Other long term (current) drug therapy: Secondary | ICD-10-CM | POA: Diagnosis not present

## 2015-01-18 DIAGNOSIS — G309 Alzheimer's disease, unspecified: Secondary | ICD-10-CM | POA: Insufficient documentation

## 2015-01-18 LAB — BASIC METABOLIC PANEL
Anion gap: 6 (ref 5–15)
BUN: 14 mg/dL (ref 6–20)
CHLORIDE: 107 mmol/L (ref 101–111)
CO2: 26 mmol/L (ref 22–32)
Calcium: 9 mg/dL (ref 8.9–10.3)
Creatinine, Ser: 1.21 mg/dL (ref 0.61–1.24)
GFR calc non Af Amer: 55 mL/min — ABNORMAL LOW (ref >60)
Glucose, Bld: 114 mg/dL — ABNORMAL HIGH (ref 65–99)
Potassium: 4.2 mmol/L (ref 3.5–5.1)
Sodium: 139 mmol/L (ref 135–145)

## 2015-01-18 LAB — CBC WITH DIFFERENTIAL/PLATELET
Basophils Absolute: 0 10*3/uL (ref 0.0–0.1)
Basophils Relative: 0 % (ref 0–1)
EOS ABS: 0.2 10*3/uL (ref 0.0–0.7)
Eosinophils Relative: 2 % (ref 0–5)
HCT: 40.6 % (ref 39.0–52.0)
Hemoglobin: 12.9 g/dL — ABNORMAL LOW (ref 13.0–17.0)
LYMPHS ABS: 3.9 10*3/uL (ref 0.7–4.0)
LYMPHS PCT: 40 % (ref 12–46)
MCH: 29.6 pg (ref 26.0–34.0)
MCHC: 31.8 g/dL (ref 30.0–36.0)
MCV: 93.1 fL (ref 78.0–100.0)
MONO ABS: 1.4 10*3/uL — AB (ref 0.1–1.0)
Monocytes Relative: 14 % — ABNORMAL HIGH (ref 3–12)
NEUTROS ABS: 4.2 10*3/uL (ref 1.7–7.7)
Neutrophils Relative %: 44 % (ref 43–77)
PLATELETS: 208 10*3/uL (ref 150–400)
RBC: 4.36 MIL/uL (ref 4.22–5.81)
RDW: 13.9 % (ref 11.5–15.5)
WBC: 9.6 10*3/uL (ref 4.0–10.5)

## 2015-01-18 MED ORDER — CLINDAMYCIN HCL 150 MG PO CAPS: 150.0000 mg | ORAL_CAPSULE | Freq: Four times a day (QID) | ORAL | Status: DC

## 2015-01-18 MED ORDER — CLINDAMYCIN HCL 150 MG PO CAPS
150.0000 mg | ORAL_CAPSULE | Freq: Once | ORAL | Status: AC
  Administered 2015-01-18: 150 mg via ORAL
  Filled 2015-01-18: qty 1

## 2015-01-18 NOTE — ED Notes (Signed)
Bed: [facility] Expected date:  Expected time:  Means of arrival:  Comments: 

## 2015-01-18 NOTE — ED Provider Notes (Signed)
CSN: [id number]     Arrival date & time 01/18/15  1054 History   First MD Initiated Contact with Patient 01/18/15 1101     Chief Complaint  Patient presents with  . Leg Pain  . Leg Swelling     (Consider location/radiation/quality/duration/timing/severity/associated sxs/prior Treatment) HPI   Patient is a 81 year old demented patient that does not speak [place]. He is presenting today with increasing erythema to his right lower extremity. Patient unable to explain his past medical history. However according to nursing home patient had small wound on right lower extremity and erythema has spread. On arrival here no fever or abnormal vital signs.     Past Medical History  Diagnosis Date  . Diabetes mellitus without complication   . Hypertension   . Alzheimer disease   . CAD (coronary artery disease)   . Stroke    Past Surgical History  Procedure Laterality Date  . Coronary artery bypass graft     Family History  Problem Relation Age of Onset  . Heart attack Son    History  Substance Use Topics  . Smoking status: Never Smoker   . Smokeless tobacco: Not on file  . Alcohol Use: Not on file    Review of Systems  Unable to perform ROS All other systems reviewed and are negative.     Allergies  Asa  Home Medications   Prior to Admission medications   Medication Sig Start Date End Date Taking? Authorizing Provider  amLODipine (NORVASC) 5 MG tablet Take 5 mg by mouth every morning.   Yes Historical Provider, MD  cetaphil (CETAPHIL) cream Apply 1 application topically 2 (two) times daily as needed (for dry skin areas).    Yes Historical Provider, MD  clopidogrel (PLAVIX) 75 MG tablet Take 75 mg by mouth daily with breakfast.   Yes Historical Provider, MD  dexlansoprazole (DEXILANT) 60 MG capsule Take 60 mg by mouth every morning.   Yes Historical Provider, MD  fluocinonide-emollient (LIDEX-E) 0.05 % cream Apply 1 application topically 3 (three) times daily. 11/25/13  Yes  Elvina Sidle, MD  losartan (COZAAR) 50 MG tablet Take 1 tablet (50 mg total) by mouth daily. At 8 am Patient taking differently: Take 50 mg by mouth daily.  02/10/14  Yes Elvina Sidle, MD  memantine (NAMENDA) 10 MG tablet Take 10 mg by mouth every morning.   Yes Historical Provider, MD  metFORMIN (GLUCOPHAGE) 500 MG tablet Take 500 mg by mouth at bedtime.   Yes Historical Provider, MD  QUEtiapine (SEROQUEL) 25 MG tablet Take 25 mg by mouth at bedtime.   Yes Historical Provider, MD  traMADol (ULTRAM) 50 MG tablet Take 50 mg by mouth every 12 (twelve) hours as needed for moderate pain.   Yes Historical Provider, MD  triamcinolone cream (KENALOG) 0.1 % Apply 1 application topically every 12 (twelve) hours as needed (for dry skin areas).   Yes Historical Provider, MD  clindamycin (CLEOCIN) 150 MG capsule Take 1 capsule (150 mg total) by mouth 4 (four) times daily. 01/18/15   Nashid Pellum Lyn Anndee Connett, MD   BP 141/79 mmHg  Pulse 87  Temp(Src) 98.1 F (36.7 C) (Oral)  Resp 19  SpO2 99% Physical Exam  Constitutional: He appears well-nourished.  HENT:  Head: Normocephalic.  Mouth/Throat: Oropharynx is clear and moist.  Eyes: Conjunctivae are normal.  Neck: No tracheal deviation present.  Cardiovascular: Normal rate.   Pulmonary/Chest: Effort normal. No stridor. No respiratory distress.  Abdominal: Soft. There is no tenderness. There is no  guarding.  Musculoskeletal: Normal range of motion. He exhibits no edema.  Right lower extremity shows mild diffuse erythema suggestive of chronic venous disease. Patient has scars from old bypass. Mild swelling. Dopplerable PT pulses  Neurological: No cranial nerve deficit.  Nonverbal.  Skin: Skin is warm and dry. No rash noted. He is not diaphoretic.  Psychiatric: He has a normal mood and affect. His behavior is normal.  Nursing note and vitals reviewed.   ED Course  Procedures (including critical care time) Labs Review Labs Reviewed  BASIC METABOLIC  PANEL - Abnormal; Notable for the following:    Glucose, Bld 114 (*)    GFR calc non Af Amer 55 (*)    All other components within normal limits  CBC WITH DIFFERENTIAL/PLATELET - Abnormal; Notable for the following:    Hemoglobin 12.9 (*)    Monocytes Relative 14 (*)    Monocytes Absolute 1.4 (*)    All other components within normal limits    Imaging Review No results found.   EKG Interpretation   Date/Time:  Wednesday January 18 2015 11:51:40 EDT Ventricular Rate:  89 PR Interval:  157 QRS Duration: 75 QT Interval:  394 QTC Calculation: 479 R Axis:   21 Text Interpretation:  Sinus rhythm Posterior infarct, old Borderline  repolarization abnormality no acute ischemia No significant change since  last tracing Confirmed by Kandis Mannan ([id number]) on 01/18/2015 12:13:32  PM      MDM   Final diagnoses:  Cellulitis of right lower extremity    Patient is a 81 year old non-English-speaking Alzheimer's patient. Patient unable to indicate welling which he speaks right now. According to nursing home patient's son will be coming to the emergency department. Nursing home sent him here because of increasing erythema on his right lower extremity Patient does have mild swelling to the right lower  extremity. Dopplerable pulses. Concern for DVT versus infection versus chronic venous stasis  We'll get labs to evaluate for infection, we'll get ultrasound for DVT. We'll hope to touch base with patient's son on arrival.  Patient has no DVT. Likely superficial infection. We will treat with antibiotics and have patient follow up with PCP at nursing home.  Yarnell Arvidson Randall An, MD 01/18/15 1624

## 2015-01-18 NOTE — ED Notes (Signed)
PTAR 

## 2015-01-18 NOTE — Discharge Instructions (Signed)
Patient was seen today and found to have lately cellulitis of right lower extremities. His ultrasound was negative for any clots. Please take antibiotics as prescribed.  Vim m t? bo (Cellulitis) Vim m t? bo l b?nh nhi?m trng da v m bn d??i da. Vng nhi?m trng th??ng c mu ?? v nh?y c?m ?au. Vim m t? bo xu?t hi?n th??ng xuyn nh?t ? cnh tay v c?ng chn.  NGUYN NHN  Vim m t? bo do vi khu?n thm nh?p vo da thng qua cc v?t n?t hay v?t c?t trn da gy ra. Staphylococci v streptococci l cc lo?i vi khu?n ph? bi?n nh?t gy vim m t? bo. D?U HI?U V TRI?U CH?NG   T?y ?? v ?m.  S?ng t?y.  Nh?y c?m ?au ho?c ?au.  S?t. CH?N ?ON  Chuyn gia ch?m [place] s?c kh?e th??ng c th? xc ??nh v?n ?? d?a vo vi?c khm th?c th?. Xt nghi?m mu c?ng c th? ???c th?c hi?n. ?I?U TR?  ?i?u tr? th??ng lin quan ??n vi?c s? d?ng thu?c khng sinh. H??NG D?N CH?M [place] T?I NH   S? d?ng thu?c khng sinh theo ch? d?n c?a chuyn gia ch?m [place] s?c kh?e. Dng h?t thu?c khng sinh ngay c? khi qu v? b?t ??u c?m th?y ?? h?n.  Gi? cao cnh tay ho?c chn b? nhi?m trng ?? gi?m s?ng.  Ch??m m?t mi?ng v?i ?m vo vng b? ?nh h??ng t?i ?a 4 l?n m?i ngy ?? gi?m ?au.  Ch? s? d?ng thu?c theo ch? d?n c?a chuyn gia ch?m [place] s?c kh?e.  Tun th? m?i cu?c h?n khm l?i theo ch? d?n c?a chuyn gia ch?m [place] s?c kh?e. ?I KHM N?U:   Qu v? th?y c nh?ng v?t s?c ?? ? vng b? nhi?m trng.  Vng mu ?? tr? nn l?n h?n ho?c chuy?n sang mu s?m.  X??ng ho?c kh?p bn d??i vng b? nhi?m trng tr? nn ?au ??n sau khi da ? lnh.  Nhi?m trng ti pht ? cng m?t vng ho?c m?t vng khc.  Qu v? nh?n th?y m?t v?t s?ng trong vng b? nhi?m trng.  Qu v? c cc tri?u ch?ng m?i.  Qu v? b? s?t. NGAY L?P T?C ?I KHM N?U:   Qu v? c?m th?y r?t bu?n ng?.  Qu v? b? nn m?a ho?c tiu ch?y.  Qu v? c c?m gic m?t m?i ton thn (tnh tr?ng m?t l?) km theo ?au v nh?c c? b?p. ??M B?O QU V?:   Hi?u r cc  h??ng d?n ny.  S? theo di tnh tr?ng c?a mnh.  S? yu c?u tr? gip ngay l?p t?c n?u qu v? c?m th?y khng kh?e ho?c th?y tr?m tr?ng h?n. Document Released: 03/13/2005 Document Revised: 10/18/2013 [facility] Patient Information 2015 [place], [place]. This information is not intended to replace advice given to you by your health care provider. Make sure you discuss any questions you have with your health care provider.

## 2015-01-18 NOTE — ED Notes (Signed)
Bed: WA06 Expected date:  Expected time:  Means of arrival:  Comments: EMS- 81yo M, possible cellulitis/infection

## 2015-01-18 NOTE — Progress Notes (Signed)
*  Preliminary Results* Right lower extremity venous duplex completed. Study was technically difficult due to poor patient cooperation. Right lower extremity is negative for deep vein thrombosis. There is no evidence of right Baker's cyst.  01/18/2015 1:06 PM  Gertie Fey, RVT, RDCS, RDMS

## 2015-01-18 NOTE — ED Notes (Signed)
Patient from [facility] with complaints of possible right leg cellulitis. . Previous wound to heel, now redness and swelling up to leg. Patient does not speak [place].

## 2015-01-18 NOTE — Progress Notes (Signed)
ED CM reviewed forms form [facility] to find Dr Florentina Jenny listed for pt  EPIC updated  PMH falls, dementia, non English speaking pt  Pending visit from son to assist with possible hx and language barrier

## 2015-01-26 ENCOUNTER — Emergency Department (HOSPITAL_COMMUNITY): Payer: Medicare Other

## 2015-01-26 ENCOUNTER — Emergency Department (HOSPITAL_COMMUNITY)
Admission: EM | Admit: 2015-01-26 | Discharge: 2015-01-26 | Disposition: A | Discharge status: Skilled Nursing Facility | Payer: Medicare Other | Attending: Emergency Medicine | Admitting: Emergency Medicine

## 2015-01-26 DIAGNOSIS — Z8673 Personal history of transient ischemic attack (TIA), and cerebral infarction without residual deficits: Secondary | ICD-10-CM | POA: Diagnosis not present

## 2015-01-26 DIAGNOSIS — I10 Essential (primary) hypertension: Secondary | ICD-10-CM | POA: Diagnosis not present

## 2015-01-26 DIAGNOSIS — R040 Epistaxis: Secondary | ICD-10-CM | POA: Insufficient documentation

## 2015-01-26 DIAGNOSIS — F0280 Dementia in other diseases classified elsewhere without behavioral disturbance: Secondary | ICD-10-CM | POA: Insufficient documentation

## 2015-01-26 DIAGNOSIS — Z79899 Other long term (current) drug therapy: Secondary | ICD-10-CM | POA: Insufficient documentation

## 2015-01-26 DIAGNOSIS — Y998 Other external cause status: Secondary | ICD-10-CM | POA: Diagnosis not present

## 2015-01-26 DIAGNOSIS — Z792 Long term (current) use of antibiotics: Secondary | ICD-10-CM | POA: Insufficient documentation

## 2015-01-26 DIAGNOSIS — E119 Type 2 diabetes mellitus without complications: Secondary | ICD-10-CM | POA: Insufficient documentation

## 2015-01-26 DIAGNOSIS — W19XXXA Unspecified fall, initial encounter: Secondary | ICD-10-CM

## 2015-01-26 DIAGNOSIS — Y92129 Unspecified place in nursing home as the place of occurrence of the external cause: Secondary | ICD-10-CM | POA: Insufficient documentation

## 2015-01-26 DIAGNOSIS — Y9389 Activity, other specified: Secondary | ICD-10-CM | POA: Diagnosis not present

## 2015-01-26 DIAGNOSIS — Z7902 Long term (current) use of antithrombotics/antiplatelets: Secondary | ICD-10-CM | POA: Insufficient documentation

## 2015-01-26 DIAGNOSIS — Z951 Presence of aortocoronary bypass graft: Secondary | ICD-10-CM | POA: Insufficient documentation

## 2015-01-26 DIAGNOSIS — Z043 Encounter for examination and observation following other accident: Secondary | ICD-10-CM | POA: Diagnosis present

## 2015-01-26 DIAGNOSIS — G309 Alzheimer's disease, unspecified: Secondary | ICD-10-CM | POA: Insufficient documentation

## 2015-01-26 DIAGNOSIS — I2510 Atherosclerotic heart disease of native coronary artery without angina pectoris: Secondary | ICD-10-CM | POA: Diagnosis not present

## 2015-01-26 DIAGNOSIS — W1839XA Other fall on same level, initial encounter: Secondary | ICD-10-CM | POA: Insufficient documentation

## 2015-01-26 NOTE — ED Notes (Signed)
Pt is from [place] at [facility] and fell tonight; unwitnessed. Epistaxis. No blood thinners. [place] speaking primary. Dementia. Alert.

## 2015-01-26 NOTE — ED Notes (Signed)
Bed: WA06 Expected date:  Expected time:  Means of arrival:  Comments: EMS Fall 37M [facility]

## 2015-01-26 NOTE — ED Notes (Signed)
RN tried to use interpreter phone. Interpreter was unable to understand patient, possibly due to barrier because of dementia. Alert. No visible distress at this time.

## 2015-01-26 NOTE — ED Notes (Signed)
Attempted to call facility to make aware of pt being transported and give report but no answered.

## 2015-01-26 NOTE — ED Notes (Addendum)
Resting quietly with eye closed. Easily arousable. Verbally responsive. Speech unclear. Resp even and unlabored. No audible adventitious breath sounds noted. ABC's intact. Called communication for transport.

## 2015-01-26 NOTE — ED Provider Notes (Signed)
CSN: [id number]     Arrival date & time 01/26/15  0111 History   First MD Initiated Contact with Patient 01/26/15 [phone]     Chief Complaint  Patient presents with  . Fall     (Consider location/radiation/quality/duration/timing/severity/associated sxs/prior Treatment) HPI Comments: The patient arrives in the ED via EMS, called by patient's NH after an unwitnessed fall with reported injuries related to subsequent epistaxis. The patient is nonverbal, history of Alzheimer's dementia and unable to contribute to history.   The history is provided by the EMS personnel and the nursing home. A language interpreter was used.    Past Medical History  Diagnosis Date  . Diabetes mellitus without complication   . Hypertension   . Alzheimer disease   . CAD (coronary artery disease)   . Stroke    Past Surgical History  Procedure Laterality Date  . Coronary artery bypass graft     Family History  Problem Relation Age of Onset  . Heart attack Son    Social History  Substance Use Topics  . Smoking status: Never Smoker   . Smokeless tobacco: Not on file  . Alcohol Use: Not on file    Review of Systems  Unable to perform ROS: Dementia      Allergies  Asa  Home Medications   Prior to Admission medications   Medication Sig Start Date End Date Taking? Authorizing Provider  amLODipine (NORVASC) 5 MG tablet Take 5 mg by mouth every morning.   Yes Historical Provider, MD  cetaphil (CETAPHIL) cream Apply 1 application topically 2 (two) times daily as needed (for dry skin areas).    Yes Historical Provider, MD  clindamycin (CLEOCIN) 150 MG capsule Take 1 capsule (150 mg total) by mouth 4 (four) times daily. 01/18/15  Yes Courteney Lyn Mackuen, MD  clopidogrel (PLAVIX) 75 MG tablet Take 75 mg by mouth daily with breakfast.   Yes Historical Provider, MD  dexlansoprazole (DEXILANT) 60 MG capsule Take 60 mg by mouth every morning.   Yes Historical Provider, MD  fluocinonide-emollient (LIDEX-E)  0.05 % cream Apply 1 application topically 3 (three) times daily. 11/25/13  Yes Elvina Sidle, MD  losartan (COZAAR) 50 MG tablet Take 1 tablet (50 mg total) by mouth daily. At 8 am Patient taking differently: Take 50 mg by mouth daily.  02/10/14  Yes Elvina Sidle, MD  memantine (NAMENDA) 10 MG tablet Take 10 mg by mouth every morning.   Yes Historical Provider, MD  metFORMIN (GLUCOPHAGE) 500 MG tablet Take 500 mg by mouth at bedtime.   Yes Historical Provider, MD  QUEtiapine (SEROQUEL) 25 MG tablet Take 25 mg by mouth at bedtime.   Yes Historical Provider, MD  traMADol (ULTRAM) 50 MG tablet Take 50 mg by mouth every 12 (twelve) hours as needed for moderate pain.   Yes Historical Provider, MD  triamcinolone cream (KENALOG) 0.1 % Apply 1 application topically every 12 (twelve) hours as needed (for dry skin areas).   Yes Historical Provider, MD   BP 160/81 mmHg  Pulse 93  Temp(Src) 97.7 F (36.5 C) (Oral)  Resp 16  SpO2 98% Physical Exam  Constitutional: He is oriented to person, place, and time. He appears well-developed and well-nourished. No distress.  HENT:  No active epistaxis. No facial or scalp wounds, bruising or hematomas.   Eyes: Conjunctivae are normal.  Neck: Normal range of motion.  Cardiovascular: Normal rate.   No murmur heard. Pulmonary/Chest: Effort normal. He has no wheezes. He has no rales. He  exhibits no tenderness.  Abdominal: Soft. Bowel sounds are normal. There is no tenderness.  Abdominal wall atraumatic.   Musculoskeletal: Normal range of motion.  Right LE redness that is mild. No draining lesions. FROM.   Neurological: He is alert and oriented to person, place, and time.  Patient awake, alert, moves all extremities.   Skin: Skin is warm and dry.  Psychiatric: He has a normal mood and affect.    ED Course  Procedures (including critical care time) Labs Review Labs Reviewed - No data to display Ct Head Wo Contrast  01/26/2015   CLINICAL DATA:  Status  post unwitnessed fall. Epistaxis. Concern for head or cervical spine injury. Initial encounter.  EXAM: CT HEAD WITHOUT CONTRAST  CT MAXILLOFACIAL WITHOUT CONTRAST  CT CERVICAL SPINE WITHOUT CONTRAST  TECHNIQUE: Multidetector CT imaging of the head, cervical spine, and maxillofacial structures were performed using the standard protocol without intravenous contrast. Multiplanar CT image reconstructions of the cervical spine and maxillofacial structures were also generated.  COMPARISON:  CT of the head and cervical spine performed 10/30/2013  FINDINGS: CT HEAD FINDINGS  There is no evidence of acute infarction, mass lesion, or intra- or extra-axial hemorrhage on CT.  Prominence of the ventricles and sulci reflects moderate cortical volume loss. Mild cerebellar atrophy is noted. A large left MCA territory infarct is noted, with associated encephalomalacia and mild ex vacuo dilatation of the left lateral ventricle. A small chronic infarct is noted at the right cerebellar hemisphere. Scattered periventricular and subcortical cystic change likely reflects small vessel ischemic microangiopathy.  The brainstem and fourth ventricle are within normal limits. No mass effect or midline shift is seen.  There is no evidence of fracture; visualized osseous structures are unremarkable in appearance. The visualized portions of the orbits are within normal limits. The paranasal sinuses and mastoid air cells are well-aerated. No significant soft tissue abnormalities are seen.  CT MAXILLOFACIAL FINDINGS  There is no evidence of fracture or dislocation. The maxilla and mandible appear intact. The nasal bone is unremarkable in appearance. There is chronic absence of the dentition.  The orbits are intact bilaterally. The visualized paranasal sinuses and mastoid air cells are well-aerated.  A small metallic density is noted overlying the inferior right maxilla, within the superficial soft tissues. Scattered postoperative change is noted  along the right side of the neck. The parapharyngeal fat planes are preserved. The nasopharynx, oropharynx and hypopharynx are unremarkable in appearance. The visualized portions of the valleculae and piriform sinuses are grossly unremarkable.  The parotid and submandibular glands are within normal limits. No cervical lymphadenopathy is seen.  CT CERVICAL SPINE FINDINGS  There is no evidence of fracture or subluxation. Vertebral bodies demonstrate normal height and alignment. Intervertebral disc spaces are preserved. Scattered anterior and posterior disc osteophyte complexes are noted along the cervical spine. Prevertebral soft tissues are within normal limits. The visualized neural foramina are grossly unremarkable.  Calcification at the left common carotid bifurcation raises concern for underlying severe luminal narrowing.  The visualized portions of the thyroid gland are unremarkable in appearance. The visualized lung apices are clear. No significant soft tissue abnormalities are seen.  IMPRESSION: 1. No evidence of traumatic intracranial injury or fracture. 2. No evidence of fracture or dislocation with regard to the maxillofacial structures. 3. No evidence of fracture or subluxation along the cervical spine. 4. Moderate cortical volume loss and scattered small vessel ischemic microangiopathy. 5. Large left MCA territory infarct, with associated encephalomalacia and mild ex vacuo dilatation of  the left lateral ventricle. Small chronic infarct at the right cerebellar hemisphere. 6. Small metallic density overlying the inferior right maxilla, within the overlying soft tissues. 7. Mild degenerative change along the cervical spine. 8. Calcification at the left common carotid bifurcation raises concern for underlying severe luminal narrowing. Carotid ultrasound would be helpful for further evaluation, when and as deemed clinically appropriate.   Electronically Signed   By: Roanna Raider M.D.   On: 01/26/2015 05:14    Ct Cervical Spine Wo Contrast  01/26/2015   CLINICAL DATA:  Status post unwitnessed fall. Epistaxis. Concern for head or cervical spine injury. Initial encounter.  EXAM: CT HEAD WITHOUT CONTRAST  CT MAXILLOFACIAL WITHOUT CONTRAST  CT CERVICAL SPINE WITHOUT CONTRAST  TECHNIQUE: Multidetector CT imaging of the head, cervical spine, and maxillofacial structures were performed using the standard protocol without intravenous contrast. Multiplanar CT image reconstructions of the cervical spine and maxillofacial structures were also generated.  COMPARISON:  CT of the head and cervical spine performed 10/30/2013  FINDINGS: CT HEAD FINDINGS  There is no evidence of acute infarction, mass lesion, or intra- or extra-axial hemorrhage on CT.  Prominence of the ventricles and sulci reflects moderate cortical volume loss. Mild cerebellar atrophy is noted. A large left MCA territory infarct is noted, with associated encephalomalacia and mild ex vacuo dilatation of the left lateral ventricle. A small chronic infarct is noted at the right cerebellar hemisphere. Scattered periventricular and subcortical cystic change likely reflects small vessel ischemic microangiopathy.  The brainstem and fourth ventricle are within normal limits. No mass effect or midline shift is seen.  There is no evidence of fracture; visualized osseous structures are unremarkable in appearance. The visualized portions of the orbits are within normal limits. The paranasal sinuses and mastoid air cells are well-aerated. No significant soft tissue abnormalities are seen.  CT MAXILLOFACIAL FINDINGS  There is no evidence of fracture or dislocation. The maxilla and mandible appear intact. The nasal bone is unremarkable in appearance. There is chronic absence of the dentition.  The orbits are intact bilaterally. The visualized paranasal sinuses and mastoid air cells are well-aerated.  A small metallic density is noted overlying the inferior right maxilla, within  the superficial soft tissues. Scattered postoperative change is noted along the right side of the neck. The parapharyngeal fat planes are preserved. The nasopharynx, oropharynx and hypopharynx are unremarkable in appearance. The visualized portions of the valleculae and piriform sinuses are grossly unremarkable.  The parotid and submandibular glands are within normal limits. No cervical lymphadenopathy is seen.  CT CERVICAL SPINE FINDINGS  There is no evidence of fracture or subluxation. Vertebral bodies demonstrate normal height and alignment. Intervertebral disc spaces are preserved. Scattered anterior and posterior disc osteophyte complexes are noted along the cervical spine. Prevertebral soft tissues are within normal limits. The visualized neural foramina are grossly unremarkable.  Calcification at the left common carotid bifurcation raises concern for underlying severe luminal narrowing.  The visualized portions of the thyroid gland are unremarkable in appearance. The visualized lung apices are clear. No significant soft tissue abnormalities are seen.  IMPRESSION: 1. No evidence of traumatic intracranial injury or fracture. 2. No evidence of fracture or dislocation with regard to the maxillofacial structures. 3. No evidence of fracture or subluxation along the cervical spine. 4. Moderate cortical volume loss and scattered small vessel ischemic microangiopathy. 5. Large left MCA territory infarct, with associated encephalomalacia and mild ex vacuo dilatation of the left lateral ventricle. Small chronic infarct at the right cerebellar  hemisphere. 6. Small metallic density overlying the inferior right maxilla, within the overlying soft tissues. 7. Mild degenerative change along the cervical spine. 8. Calcification at the left common carotid bifurcation raises concern for underlying severe luminal narrowing. Carotid ultrasound would be helpful for further evaluation, when and as deemed clinically appropriate.    Electronically Signed   By: Roanna Raider M.D.   On: 01/26/2015 05:14   Ct Maxillofacial Wo Cm  01/26/2015   CLINICAL DATA:  Status post unwitnessed fall. Epistaxis. Concern for head or cervical spine injury. Initial encounter.  EXAM: CT HEAD WITHOUT CONTRAST  CT MAXILLOFACIAL WITHOUT CONTRAST  CT CERVICAL SPINE WITHOUT CONTRAST  TECHNIQUE: Multidetector CT imaging of the head, cervical spine, and maxillofacial structures were performed using the standard protocol without intravenous contrast. Multiplanar CT image reconstructions of the cervical spine and maxillofacial structures were also generated.  COMPARISON:  CT of the head and cervical spine performed 10/30/2013  FINDINGS: CT HEAD FINDINGS  There is no evidence of acute infarction, mass lesion, or intra- or extra-axial hemorrhage on CT.  Prominence of the ventricles and sulci reflects moderate cortical volume loss. Mild cerebellar atrophy is noted. A large left MCA territory infarct is noted, with associated encephalomalacia and mild ex vacuo dilatation of the left lateral ventricle. A small chronic infarct is noted at the right cerebellar hemisphere. Scattered periventricular and subcortical cystic change likely reflects small vessel ischemic microangiopathy.  The brainstem and fourth ventricle are within normal limits. No mass effect or midline shift is seen.  There is no evidence of fracture; visualized osseous structures are unremarkable in appearance. The visualized portions of the orbits are within normal limits. The paranasal sinuses and mastoid air cells are well-aerated. No significant soft tissue abnormalities are seen.  CT MAXILLOFACIAL FINDINGS  There is no evidence of fracture or dislocation. The maxilla and mandible appear intact. The nasal bone is unremarkable in appearance. There is chronic absence of the dentition.  The orbits are intact bilaterally. The visualized paranasal sinuses and mastoid air cells are well-aerated.  A small metallic  density is noted overlying the inferior right maxilla, within the superficial soft tissues. Scattered postoperative change is noted along the right side of the neck. The parapharyngeal fat planes are preserved. The nasopharynx, oropharynx and hypopharynx are unremarkable in appearance. The visualized portions of the valleculae and piriform sinuses are grossly unremarkable.  The parotid and submandibular glands are within normal limits. No cervical lymphadenopathy is seen.  CT CERVICAL SPINE FINDINGS  There is no evidence of fracture or subluxation. Vertebral bodies demonstrate normal height and alignment. Intervertebral disc spaces are preserved. Scattered anterior and posterior disc osteophyte complexes are noted along the cervical spine. Prevertebral soft tissues are within normal limits. The visualized neural foramina are grossly unremarkable.  Calcification at the left common carotid bifurcation raises concern for underlying severe luminal narrowing.  The visualized portions of the thyroid gland are unremarkable in appearance. The visualized lung apices are clear. No significant soft tissue abnormalities are seen.  IMPRESSION: 1. No evidence of traumatic intracranial injury or fracture. 2. No evidence of fracture or dislocation with regard to the maxillofacial structures. 3. No evidence of fracture or subluxation along the cervical spine. 4. Moderate cortical volume loss and scattered small vessel ischemic microangiopathy. 5. Large left MCA territory infarct, with associated encephalomalacia and mild ex vacuo dilatation of the left lateral ventricle. Small chronic infarct at the right cerebellar hemisphere. 6. Small metallic density overlying the inferior right maxilla, within the  overlying soft tissues. 7. Mild degenerative change along the cervical spine. 8. Calcification at the left common carotid bifurcation raises concern for underlying severe luminal narrowing. Carotid ultrasound would be helpful for  further evaluation, when and as deemed clinically appropriate.   Electronically Signed   By: Roanna Raider M.D.   On: 01/26/2015 05:14    Imaging Review No results found.   EKG Interpretation None      MDM   Final diagnoses:  None    1. Fall 2. Epistaxis, resolved  The patient's mental status has been unchanged throughout duration of emergency department visit. No acute injury discovered from fall that occurred last night and prompted evaluation.  Attempted to communicate with the patient via [employer]. The interpreter provided spoke [place], [place] and [place] but the patient did not respond verbally to any of the above. Chart reviewed and is showing primary language to be [place] Suspect dementia playing a predominant role in non-communication by teh patient.   Chart reviewed. He was recently seen and treated for lower extremity cellulitis and is currently on antibiotics. Redness of right LE c/w resolving cellulitis. No warmth, active lesion or apparent tenderness.   Feel he can be discharged back to the NH and should be followed by PCP.  Elpidio Anis, PA-C 01/26/15 [id number]  Devoria Albe, MD 01/26/15 [phone]

## 2015-01-26 NOTE — ED Notes (Signed)
[facility] at bedside to transport back to facility. 

## 2015-01-26 NOTE — Discharge Instructions (Signed)
Ct Head Wo Contrast  01/26/2015   CLINICAL DATA:  Status post unwitnessed fall. Epistaxis. Concern for head or cervical spine injury. Initial encounter.  EXAM: CT HEAD WITHOUT CONTRAST  CT MAXILLOFACIAL WITHOUT CONTRAST  CT CERVICAL SPINE WITHOUT CONTRAST  TECHNIQUE: Multidetector CT imaging of the head, cervical spine, and maxillofacial structures were performed using the standard protocol without intravenous contrast. Multiplanar CT image reconstructions of the cervical spine and maxillofacial structures were also generated.  COMPARISON:  CT of the head and cervical spine performed 10/30/2013  FINDINGS: CT HEAD FINDINGS  There is no evidence of acute infarction, mass lesion, or intra- or extra-axial hemorrhage on CT.  Prominence of the ventricles and sulci reflects moderate cortical volume loss. Mild cerebellar atrophy is noted. A large left MCA territory infarct is noted, with associated encephalomalacia and mild ex vacuo dilatation of the left lateral ventricle. A small chronic infarct is noted at the right cerebellar hemisphere. Scattered periventricular and subcortical cystic change likely reflects small vessel ischemic microangiopathy.  The brainstem and fourth ventricle are within normal limits. No mass effect or midline shift is seen.  There is no evidence of fracture; visualized osseous structures are unremarkable in appearance. The visualized portions of the orbits are within normal limits. The paranasal sinuses and mastoid air cells are well-aerated. No significant soft tissue abnormalities are seen.  CT MAXILLOFACIAL FINDINGS  There is no evidence of fracture or dislocation. The maxilla and mandible appear intact. The nasal bone is unremarkable in appearance. There is chronic absence of the dentition.  The orbits are intact bilaterally. The visualized paranasal sinuses and mastoid air cells are well-aerated.  A small metallic density is noted overlying the inferior right maxilla, within the superficial  soft tissues. Scattered postoperative change is noted along the right side of the neck. The parapharyngeal fat planes are preserved. The nasopharynx, oropharynx and hypopharynx are unremarkable in appearance. The visualized portions of the valleculae and piriform sinuses are grossly unremarkable.  The parotid and submandibular glands are within normal limits. No cervical lymphadenopathy is seen.  CT CERVICAL SPINE FINDINGS  There is no evidence of fracture or subluxation. Vertebral bodies demonstrate normal height and alignment. Intervertebral disc spaces are preserved. Scattered anterior and posterior disc osteophyte complexes are noted along the cervical spine. Prevertebral soft tissues are within normal limits. The visualized neural foramina are grossly unremarkable.  Calcification at the left common carotid bifurcation raises concern for underlying severe luminal narrowing.  The visualized portions of the thyroid gland are unremarkable in appearance. The visualized lung apices are clear. No significant soft tissue abnormalities are seen.  IMPRESSION: 1. No evidence of traumatic intracranial injury or fracture. 2. No evidence of fracture or dislocation with regard to the maxillofacial structures. 3. No evidence of fracture or subluxation along the cervical spine. 4. Moderate cortical volume loss and scattered small vessel ischemic microangiopathy. 5. Large left MCA territory infarct, with associated encephalomalacia and mild ex vacuo dilatation of the left lateral ventricle. Small chronic infarct at the right cerebellar hemisphere. 6. Small metallic density overlying the inferior right maxilla, within the overlying soft tissues. 7. Mild degenerative change along the cervical spine. 8. Calcification at the left common carotid bifurcation raises concern for underlying severe luminal narrowing. Carotid ultrasound would be helpful for further evaluation, when and as deemed clinically appropriate.   Electronically  Signed   By: Roanna Raider M.D.   On: 01/26/2015 05:14   Ct Cervical Spine Wo Contrast  01/26/2015   CLINICAL DATA:  Status post unwitnessed fall. Epistaxis. Concern for head or cervical spine injury. Initial encounter.  EXAM: CT HEAD WITHOUT CONTRAST  CT MAXILLOFACIAL WITHOUT CONTRAST  CT CERVICAL SPINE WITHOUT CONTRAST  TECHNIQUE: Multidetector CT imaging of the head, cervical spine, and maxillofacial structures were performed using the standard protocol without intravenous contrast. Multiplanar CT image reconstructions of the cervical spine and maxillofacial structures were also generated.  COMPARISON:  CT of the head and cervical spine performed 10/30/2013  FINDINGS: CT HEAD FINDINGS  There is no evidence of acute infarction, mass lesion, or intra- or extra-axial hemorrhage on CT.  Prominence of the ventricles and sulci reflects moderate cortical volume loss. Mild cerebellar atrophy is noted. A large left MCA territory infarct is noted, with associated encephalomalacia and mild ex vacuo dilatation of the left lateral ventricle. A small chronic infarct is noted at the right cerebellar hemisphere. Scattered periventricular and subcortical cystic change likely reflects small vessel ischemic microangiopathy.  The brainstem and fourth ventricle are within normal limits. No mass effect or midline shift is seen.  There is no evidence of fracture; visualized osseous structures are unremarkable in appearance. The visualized portions of the orbits are within normal limits. The paranasal sinuses and mastoid air cells are well-aerated. No significant soft tissue abnormalities are seen.  CT MAXILLOFACIAL FINDINGS  There is no evidence of fracture or dislocation. The maxilla and mandible appear intact. The nasal bone is unremarkable in appearance. There is chronic absence of the dentition.  The orbits are intact bilaterally. The visualized paranasal sinuses and mastoid air cells are well-aerated.  A small metallic density  is noted overlying the inferior right maxilla, within the superficial soft tissues. Scattered postoperative change is noted along the right side of the neck. The parapharyngeal fat planes are preserved. The nasopharynx, oropharynx and hypopharynx are unremarkable in appearance. The visualized portions of the valleculae and piriform sinuses are grossly unremarkable.  The parotid and submandibular glands are within normal limits. No cervical lymphadenopathy is seen.  CT CERVICAL SPINE FINDINGS  There is no evidence of fracture or subluxation. Vertebral bodies demonstrate normal height and alignment. Intervertebral disc spaces are preserved. Scattered anterior and posterior disc osteophyte complexes are noted along the cervical spine. Prevertebral soft tissues are within normal limits. The visualized neural foramina are grossly unremarkable.  Calcification at the left common carotid bifurcation raises concern for underlying severe luminal narrowing.  The visualized portions of the thyroid gland are unremarkable in appearance. The visualized lung apices are clear. No significant soft tissue abnormalities are seen.  IMPRESSION: 1. No evidence of traumatic intracranial injury or fracture. 2. No evidence of fracture or dislocation with regard to the maxillofacial structures. 3. No evidence of fracture or subluxation along the cervical spine. 4. Moderate cortical volume loss and scattered small vessel ischemic microangiopathy. 5. Large left MCA territory infarct, with associated encephalomalacia and mild ex vacuo dilatation of the left lateral ventricle. Small chronic infarct at the right cerebellar hemisphere. 6. Small metallic density overlying the inferior right maxilla, within the overlying soft tissues. 7. Mild degenerative change along the cervical spine. 8. Calcification at the left common carotid bifurcation raises concern for underlying severe luminal narrowing. Carotid ultrasound would be helpful for further  evaluation, when and as deemed clinically appropriate.   Electronically Signed   By: Roanna Raider M.D.   On: 01/26/2015 05:14   Ct Maxillofacial Wo Cm  01/26/2015   CLINICAL DATA:  Status post unwitnessed fall. Epistaxis. Concern for head or cervical spine injury.  Initial encounter.  EXAM: CT HEAD WITHOUT CONTRAST  CT MAXILLOFACIAL WITHOUT CONTRAST  CT CERVICAL SPINE WITHOUT CONTRAST  TECHNIQUE: Multidetector CT imaging of the head, cervical spine, and maxillofacial structures were performed using the standard protocol without intravenous contrast. Multiplanar CT image reconstructions of the cervical spine and maxillofacial structures were also generated.  COMPARISON:  CT of the head and cervical spine performed 10/30/2013  FINDINGS: CT HEAD FINDINGS  There is no evidence of acute infarction, mass lesion, or intra- or extra-axial hemorrhage on CT.  Prominence of the ventricles and sulci reflects moderate cortical volume loss. Mild cerebellar atrophy is noted. A large left MCA territory infarct is noted, with associated encephalomalacia and mild ex vacuo dilatation of the left lateral ventricle. A small chronic infarct is noted at the right cerebellar hemisphere. Scattered periventricular and subcortical cystic change likely reflects small vessel ischemic microangiopathy.  The brainstem and fourth ventricle are within normal limits. No mass effect or midline shift is seen.  There is no evidence of fracture; visualized osseous structures are unremarkable in appearance. The visualized portions of the orbits are within normal limits. The paranasal sinuses and mastoid air cells are well-aerated. No significant soft tissue abnormalities are seen.  CT MAXILLOFACIAL FINDINGS  There is no evidence of fracture or dislocation. The maxilla and mandible appear intact. The nasal bone is unremarkable in appearance. There is chronic absence of the dentition.  The orbits are intact bilaterally. The visualized paranasal sinuses  and mastoid air cells are well-aerated.  A small metallic density is noted overlying the inferior right maxilla, within the superficial soft tissues. Scattered postoperative change is noted along the right side of the neck. The parapharyngeal fat planes are preserved. The nasopharynx, oropharynx and hypopharynx are unremarkable in appearance. The visualized portions of the valleculae and piriform sinuses are grossly unremarkable.  The parotid and submandibular glands are within normal limits. No cervical lymphadenopathy is seen.  CT CERVICAL SPINE FINDINGS  There is no evidence of fracture or subluxation. Vertebral bodies demonstrate normal height and alignment. Intervertebral disc spaces are preserved. Scattered anterior and posterior disc osteophyte complexes are noted along the cervical spine. Prevertebral soft tissues are within normal limits. The visualized neural foramina are grossly unremarkable.  Calcification at the left common carotid bifurcation raises concern for underlying severe luminal narrowing.  The visualized portions of the thyroid gland are unremarkable in appearance. The visualized lung apices are clear. No significant soft tissue abnormalities are seen.  IMPRESSION: 1. No evidence of traumatic intracranial injury or fracture. 2. No evidence of fracture or dislocation with regard to the maxillofacial structures. 3. No evidence of fracture or subluxation along the cervical spine. 4. Moderate cortical volume loss and scattered small vessel ischemic microangiopathy. 5. Large left MCA territory infarct, with associated encephalomalacia and mild ex vacuo dilatation of the left lateral ventricle. Small chronic infarct at the right cerebellar hemisphere. 6. Small metallic density overlying the inferior right maxilla, within the overlying soft tissues. 7. Mild degenerative change along the cervical spine. 8. Calcification at the left common carotid bifurcation raises concern for underlying severe  luminal narrowing. Carotid ultrasound would be helpful for further evaluation, when and as deemed clinically appropriate.   Electronically Signed   By: Roanna Raider M.D.   On: 01/26/2015 05:14

## 2015-01-26 NOTE — ED Notes (Signed)
Awake. Verbally responsive. A/O. Resp even and unlabored. No audible adventitious breath sounds noted. ABC's intact.  

## 2015-02-16 ENCOUNTER — Emergency Department (HOSPITAL_COMMUNITY)
Admission: EM | Admit: 2015-02-16 | Discharge: 2015-02-16 | Disposition: A | Discharge status: Home / Self Care | Payer: Medicare Other | Attending: Emergency Medicine | Admitting: Emergency Medicine

## 2015-02-16 ENCOUNTER — Encounter (HOSPITAL_COMMUNITY): Payer: Self-pay | Admitting: Emergency Medicine

## 2015-02-16 ENCOUNTER — Emergency Department (HOSPITAL_COMMUNITY): Payer: Medicare Other

## 2015-02-16 DIAGNOSIS — Y9389 Activity, other specified: Secondary | ICD-10-CM | POA: Insufficient documentation

## 2015-02-16 DIAGNOSIS — S0081XA Abrasion of other part of head, initial encounter: Secondary | ICD-10-CM | POA: Insufficient documentation

## 2015-02-16 DIAGNOSIS — Z951 Presence of aortocoronary bypass graft: Secondary | ICD-10-CM | POA: Insufficient documentation

## 2015-02-16 DIAGNOSIS — Z79899 Other long term (current) drug therapy: Secondary | ICD-10-CM | POA: Diagnosis not present

## 2015-02-16 DIAGNOSIS — E119 Type 2 diabetes mellitus without complications: Secondary | ICD-10-CM | POA: Diagnosis not present

## 2015-02-16 DIAGNOSIS — S199XXA Unspecified injury of neck, initial encounter: Secondary | ICD-10-CM | POA: Insufficient documentation

## 2015-02-16 DIAGNOSIS — I10 Essential (primary) hypertension: Secondary | ICD-10-CM | POA: Diagnosis not present

## 2015-02-16 DIAGNOSIS — S0990XA Unspecified injury of head, initial encounter: Secondary | ICD-10-CM

## 2015-02-16 DIAGNOSIS — I2510 Atherosclerotic heart disease of native coronary artery without angina pectoris: Secondary | ICD-10-CM | POA: Insufficient documentation

## 2015-02-16 DIAGNOSIS — G309 Alzheimer's disease, unspecified: Secondary | ICD-10-CM | POA: Diagnosis not present

## 2015-02-16 DIAGNOSIS — Y998 Other external cause status: Secondary | ICD-10-CM | POA: Diagnosis not present

## 2015-02-16 DIAGNOSIS — Y9289 Other specified places as the place of occurrence of the external cause: Secondary | ICD-10-CM | POA: Insufficient documentation

## 2015-02-16 DIAGNOSIS — Z8673 Personal history of transient ischemic attack (TIA), and cerebral infarction without residual deficits: Secondary | ICD-10-CM | POA: Insufficient documentation

## 2015-02-16 DIAGNOSIS — W1839XA Other fall on same level, initial encounter: Secondary | ICD-10-CM | POA: Insufficient documentation

## 2015-02-16 LAB — I-STAT CHEM 8, ED
BUN: 16 mg/dL (ref 6–20)
CALCIUM ION: 1.14 mmol/L (ref 1.13–1.30)
Chloride: 105 mmol/L (ref 101–111)
Creatinine, Ser: 1 mg/dL (ref 0.61–1.24)
Glucose, Bld: 118 mg/dL — ABNORMAL HIGH (ref 65–99)
HCT: 43 % (ref 39.0–52.0)
HEMOGLOBIN: 14.6 g/dL (ref 13.0–17.0)
Potassium: 3.9 mmol/L (ref 3.5–5.1)
SODIUM: 141 mmol/L (ref 135–145)
TCO2: 25 mmol/L (ref 0–100)

## 2015-02-16 NOTE — ED Notes (Signed)
RN called [facility] ([address]) and spoke with the med tech Francena Hanly) responsible for patients care; Francena Hanly sts pt was sitting at table, stood from seat at table and fell striking head on floor; unknown if patient lost consciousness; Francena Hanly sts patient speaks vietnamese and is usually verbal; She also sts she contacted son who is fluent in both [place] and [place]; This RN will try to contact this person

## 2015-02-16 NOTE — ED Notes (Signed)
Patient transported to CT 

## 2015-02-16 NOTE — ED Notes (Signed)
RN spoke with patients emergency contact- sts patient does not talk much since his stroke and alzheimers; Son sts "I cannot understand him sometimes and I speak his language"; son also sts that patient has been c/o pain in the RIGHT leg/foot for a couple days; this information relayed to provider

## 2015-02-16 NOTE — ED Provider Notes (Signed)
CSN: [id number]     Arrival date & time 02/16/15  3086 History   First MD Initiated Contact with Patient 02/16/15 [phone]     Chief Complaint  Patient presents with  . Fall  . Head Injury      HPI Patient presents emergency department after a witnessed fall at his skilled nursing facility.  He has a history of Alzheimer's.  His family reports he is noncommunicative at this point.  He speaks [place]  As reported that he fell from a standing position and struck his head.  He presents with injuries and abrasions to his right scalp.  No loss consciousness.  Patient was able stand up on his own after the event.  he is on Plavix.    Past Medical History  Diagnosis Date  . Diabetes mellitus without complication   . Hypertension   . Alzheimer disease   . CAD (coronary artery disease)   . Stroke    Past Surgical History  Procedure Laterality Date  . Coronary artery bypass graft     Family History  Problem Relation Age of Onset  . Heart attack Son    Social History  Substance Use Topics  . Smoking status: Never Smoker   . Smokeless tobacco: None  . Alcohol Use: None    Review of Systems  Unable to perform ROS: Dementia      Allergies  Asa  Home Medications   Prior to Admission medications   Medication Sig Start Date End Date Taking? Authorizing Provider  amLODipine (NORVASC) 5 MG tablet Take 5 mg by mouth every morning.   Yes Historical Provider, MD  cetaphil (CETAPHIL) cream Apply 1 application topically 2 (two) times daily as needed (for dry skin areas).    Yes Historical Provider, MD  clopidogrel (PLAVIX) 75 MG tablet Take 75 mg by mouth daily with breakfast.   Yes Historical Provider, MD  dexlansoprazole (DEXILANT) 60 MG capsule Take 60 mg by mouth every morning.   Yes Historical Provider, MD  fluocinonide-emollient (LIDEX-E) 0.05 % cream Apply 1 application topically 3 (three) times daily. 11/25/13  Yes Elvina Sidle, MD  losartan (COZAAR) 50 MG tablet Take 1 tablet  (50 mg total) by mouth daily. At 8 am Patient taking differently: Take 50 mg by mouth daily.  02/10/14  Yes Elvina Sidle, MD  memantine (NAMENDA) 10 MG tablet Take 10 mg by mouth every morning.   Yes Historical Provider, MD  metFORMIN (GLUCOPHAGE) 500 MG tablet Take 500 mg by mouth at bedtime.   Yes Historical Provider, MD  QUEtiapine (SEROQUEL) 25 MG tablet Take 25 mg by mouth at bedtime.   Yes Historical Provider, MD  traMADol (ULTRAM) 50 MG tablet Take 50 mg by mouth every 12 (twelve) hours as needed for moderate pain.   Yes Historical Provider, MD  triamcinolone cream (KENALOG) 0.1 % Apply 1 application topically every 12 (twelve) hours as needed (for dry skin areas).   Yes Historical Provider, MD  clindamycin (CLEOCIN) 150 MG capsule Take 1 capsule (150 mg total) by mouth 4 (four) times daily. Patient not taking: Reported on 02/16/2015 01/18/15   Courteney Lyn Mackuen, MD   BP 153/64 mmHg  Pulse 95  Temp(Src) 98.1 F (36.7 C) (Oral)  Resp 18  SpO2 99% Physical Exam  Constitutional: He appears well-developed and well-nourished.  HENT:  Head: Normocephalic and atraumatic.  Superficial abrasions to the superior temporal region on the right  Eyes: Pupils are equal, round, and reactive to light.  Neck: Neck  supple.  Mild cervical paracervical tenderness without cervical step-offs.  Cardiovascular: Normal rate and regular rhythm.   Pulmonary/Chest: Effort normal.  Abdominal: Soft.  Musculoskeletal: Normal range of motion.  Full range of motion of bilateral knees, hips, ankles.  Neurological: He is alert.  5/5 strength in major muscle groups of  bilateral upper and lower extremities. Speech normal. No facial asymetry.   Nursing note and vitals reviewed.   ED Course  Procedures (including critical care time) Labs Review Labs Reviewed  I-STAT CHEM 8, ED    Imaging Review No results found. I have personally reviewed and evaluated these images and lab results as part of my medical  decision-making.   EKG Interpretation None      MDM   Final diagnoses:  None    Patient will undergo imaging of his head and neck.  Electrolytes and hemoglobin will be checked.  No lacerations to be repaired.  Care to Dr Freida Busman to follow up on images and tests    Azalia Bilis, MD 02/16/15 [phone]

## 2015-02-16 NOTE — ED Notes (Signed)
Report called to Francena Hanly, the care giver at [facility].  Pt to be transferred via [facility].

## 2015-02-16 NOTE — Discharge Instructions (Signed)
Ch?n th??ng ??u (Head Injury) Qu v? b? m?t ch?n th??ng ? ??u. Ch?n th??ng khng c v? nghim tr?ng vo th?i ?i?m ny. ?au ??u v nn m?a l ph? bi?n sau ch?n th??ng ??u. Qu v? d? b? th?c d?y v c?n ?au. ?i khi qu v? c?n ? l?i phng c?p c?u m?t th?i gian ?? quan st. ?i khi qu v? c th? c?n nh?p vi?n. Sau nh?ng ch?n th??ng nh? ch?n th??ng c?a qu v?, h?u h?t cc v?n ?? x?y ra trong vng 24 gi? ??u, nh?ng ?nh h??ng ph? c th? x?y ra trong 7 ??n 10 ngy sau ch?n th??ng. ?i?u quan tr?ng l qu v? c?n theo di st tnh tr?ng c?a mnh v lin l?c v?i chuyn gia ch?m [place] s?c kh?e ho?c ?i khm ngay l?p t?c n?u tnh tr?ng c?a qu v? thay ??i. C NH?NG LO?I CH?N TH??NG ??U NO? Ch?n th??ng ??u c th? nh? do m?t va ch?m. M?t s? ch?n th??ng ??u c th? n?ng h?n. Nh?ng ch?n th??ng ??u n?ng h?n bao g?m:  Ch?n th??ng gy chong no (ch?n ??ng).  B?m gi?p no (??ng gi?p). ?i?u ny c ngh?a l c ch?y mu no c th? gy s?ng n?.  N?t x??ng s? (r?n x??ng s?).  Mu ch?y trong no b? ??ng l?i, ?ng c?c, v hnh thnh m?t c?c (t? mu). NGUYN NHN CH?N TH??NG ??U L G? Ch?n th??ng n?ng ? ??u th??ng x?y ra v?i ng??i ng?i trong xe b? tai n?n v khng ?eo dy an ton. Nh?ng nguyn nhn khc d?n ??n ch?n th??ng ??u m?c ?? n?ng bao g?m tai n?n xe ??p ho?c xe my, ch?n th??ng do ch?i th? thao v b? ng. CH?N TH??NG ??U ???C CH?N ?ON NH? TH? NO? Ton b? ti?n s? c?a s? ki?n d?n ??n ch?n th??ng v cc tri?u ch?ng hi?n t?i c?a qu v? s? h?u ch cho vi?c ch?n ?on ch?n th??ng ??u. \Nhi?u khi c?n ph?i ch?p no, ch?ng h?n nh? ch?p CT ho?c MRI, ?? xem m?c ?? th??ng t?n. Thng th??ng c?n ph?i ? l?i b?nh vi?n qua ?m ?? theo di.  KHI NO TI C?N ?I KHM NGAY L?P T?C?  Qu v? c?n ???c tr? gip ngay n?u:  Qu v? b? l l?n ho?c bu?n ng?.  Qu v? c?m th?y kh ch?u trong d? dy (bu?n nn) ho?c lin t?c nn m?a nhi?u.  Qu v? b? chng m?t ho?c ??ng khng v?ng, ?i?u ny tr? nn t? h?n.  Qu v? b? ?au ??u n?ng, lin t?c khng  thuyn gi?m sau khi s? d?ng thu?c. Ch? s? d?ng thu?c khng c?n k ??n ho?c thu?c c?n k ??n ?? gi?m ?au, h? s?t, ho?c gi?m c?m gic kh ch?u theo ch? d?n c?a chuyn gia ch?m [place] s?c kh?e c?a qu v?.  Qu v? khng c? ??ng ???c tay ho?c chn nh? bnh th??ng ho?c khng th? ?i l?i.  Qu v? th?y thay ??i cc ?i?m ?en ? trung tm c?a ph?n c mu c?a m?t (??ng t?).  C d?ch trong ho?c l?n mu ch?y ra kh?i m?i ho?c tai c?a qu v?.  Qu v? khng nhn th?y g. Trong vng 24 gi? sau khi b? ch?n th??ng, qu v? ph?i ? cng v?i m?t ng??i no ? c th? theo di qu v? ?? pht hi?n nh?ng d?u hi?u c?nh bo. Ng??i ny c?n lin l?c v?i d?ch v? c?p c?u ? ??a ph??ng (911 ? M?) n?u qu v? b? co gi?t, qu v? b? b?t t?nh,  ho?c qu v? khng th? t?nh l?i ???c. TI C TH? NG?N NG?A CH?N TH??NG ??U TRONG T??NG LAI NH? TH? NO? Y?u t? quan tr?ng nh?t trong vi?c ng?n ng?a ch?n th??ng n?ng ? ??u l trnh b? tai n?n xe c?. ?? gi?m thi?u kh? n?ng th??ng t?n ? ??u, ?i?u quan tr?ng nh?t l ph?i ?eo dy b?o hi?m khi ?i xe. ??i m? b?o hi?m khi ?i xe my v khi ch?i cc mn th? thao ??i khng (nh? bng ?) c?ng l h?u ch. Ngoi ra, vi?c trnh nh?ng ho?t ??ng nguy hi?m xung quanh nh c?ng s? gip gi?m nguy c? ch?n th??ng ??u.  KHI NO TI C TH? TR? L?I HO?T ??NG V T?P TH? THAO BNH TH??NG? Qu v? c?n ???c chuyn gia ch?m [place] s?c kh?e khm l?i tr??c khi tr? l?i nh?ng ho?t ??ng ny. N?u qu v? c b?t k? tri?u ch?ng no d??i ?y, qu v? khng nn th?c hi?n cc ho?t ??ng ho?c ch?i th? thao ??i khng tr? l?i cho ??n khi ???c 1 tu?n sau khi h?t nh?ng tri?u ch?ng ny.  ?au ??u lin [place] m?t ho?c chng m?t.  Phn tm v thi?u t?p trung.  B? l l?n.  V?n ?? tr nh?Marland Kitchen  Bu?n nn ho?c nn m?a.  M?t m?i ho?c d? m?t m?i.  D? b? kch thch.  Khng ch?u ???c nh sng m?nh ho?c ti?ng ?n l?n.  Lo l?ng ho?c tr?m c?m.  Ng? khng su. ??M B?O QU V?:   Hi?u r cc h??ng d?n ny.  S? theo di tnh tr?ng c?a mnh.  S? yu c?u  tr? gip ngay l?p t?c n?u qu v? c?m th?y khng kh?e ho?c th?y tr?m tr?ng h?n. Document Released: 06/03/2005 Document Revised: 06/08/2013 [facility] Patient Information 2015 [place]. This information is not intended to replace advice given to you by your health care provider. Make sure you discuss any questions you have with your health care provider.

## 2015-02-16 NOTE — ED Notes (Signed)
Report and paperwork given to [facility]. Pt being transported back to [facility].

## 2015-02-16 NOTE — ED Notes (Signed)
Pt presents to ED from SNF by [facility] for fall where he struck head and has abrasions to right forehead; pt nonverbal but is vietnamese; EMS sts fall was witnessed by roommate; VSS; pt alert but unable to assess orientation d/t nonverbal state

## 2015-03-04 ENCOUNTER — Emergency Department (HOSPITAL_COMMUNITY)
Admission: EM | Admit: 2015-03-04 | Discharge: 2015-03-04 | Disposition: A | Discharge status: Home / Self Care | Payer: Medicare Other | Attending: Emergency Medicine | Admitting: Emergency Medicine

## 2015-03-04 ENCOUNTER — Encounter (HOSPITAL_COMMUNITY): Payer: Self-pay | Admitting: *Deleted

## 2015-03-04 ENCOUNTER — Emergency Department (HOSPITAL_COMMUNITY): Payer: Medicare Other

## 2015-03-04 DIAGNOSIS — W1839XA Other fall on same level, initial encounter: Secondary | ICD-10-CM | POA: Diagnosis not present

## 2015-03-04 DIAGNOSIS — Y998 Other external cause status: Secondary | ICD-10-CM | POA: Insufficient documentation

## 2015-03-04 DIAGNOSIS — Z7902 Long term (current) use of antithrombotics/antiplatelets: Secondary | ICD-10-CM | POA: Diagnosis not present

## 2015-03-04 DIAGNOSIS — Z8673 Personal history of transient ischemic attack (TIA), and cerebral infarction without residual deficits: Secondary | ICD-10-CM | POA: Insufficient documentation

## 2015-03-04 DIAGNOSIS — E119 Type 2 diabetes mellitus without complications: Secondary | ICD-10-CM | POA: Insufficient documentation

## 2015-03-04 DIAGNOSIS — Y9289 Other specified places as the place of occurrence of the external cause: Secondary | ICD-10-CM | POA: Insufficient documentation

## 2015-03-04 DIAGNOSIS — Y9389 Activity, other specified: Secondary | ICD-10-CM | POA: Diagnosis not present

## 2015-03-04 DIAGNOSIS — Z79899 Other long term (current) drug therapy: Secondary | ICD-10-CM | POA: Diagnosis not present

## 2015-03-04 DIAGNOSIS — W19XXXA Unspecified fall, initial encounter: Secondary | ICD-10-CM

## 2015-03-04 DIAGNOSIS — T148 Other injury of unspecified body region: Secondary | ICD-10-CM | POA: Insufficient documentation

## 2015-03-04 DIAGNOSIS — I10 Essential (primary) hypertension: Secondary | ICD-10-CM | POA: Diagnosis not present

## 2015-03-04 DIAGNOSIS — I2510 Atherosclerotic heart disease of native coronary artery without angina pectoris: Secondary | ICD-10-CM | POA: Diagnosis not present

## 2015-03-04 DIAGNOSIS — L97429 Non-pressure chronic ulcer of left heel and midfoot with unspecified severity: Secondary | ICD-10-CM | POA: Diagnosis not present

## 2015-03-04 DIAGNOSIS — G309 Alzheimer's disease, unspecified: Secondary | ICD-10-CM | POA: Insufficient documentation

## 2015-03-04 LAB — COMPREHENSIVE METABOLIC PANEL
ALBUMIN: 3.3 g/dL — AB (ref 3.5–5.0)
ALT: 28 U/L (ref 17–63)
AST: 37 U/L (ref 15–41)
Alkaline Phosphatase: 76 U/L (ref 38–126)
Anion gap: 10 (ref 5–15)
BUN: 17 mg/dL (ref 6–20)
CHLORIDE: 107 mmol/L (ref 101–111)
CO2: 21 mmol/L — AB (ref 22–32)
CREATININE: 0.99 mg/dL (ref 0.61–1.24)
Calcium: 8.5 mg/dL — ABNORMAL LOW (ref 8.9–10.3)
GFR calc Af Amer: >60 mL/min (ref >60)
Glucose, Bld: 141 mg/dL — ABNORMAL HIGH (ref 65–99)
POTASSIUM: 3.9 mmol/L (ref 3.5–5.1)
SODIUM: 138 mmol/L (ref 135–145)
Total Bilirubin: 0.8 mg/dL (ref 0.3–1.2)
Total Protein: 7.1 g/dL (ref 6.5–8.1)

## 2015-03-04 LAB — CK: CK TOTAL: 131 U/L (ref 49–397)

## 2015-03-04 LAB — TROPONIN I: Troponin I: <0.03 ng/mL (ref <0.031)

## 2015-03-04 MED ORDER — LORAZEPAM 2 MG/ML IJ SOLN
0.5000 mg | Freq: Once | INTRAMUSCULAR | Status: AC
  Administered 2015-03-04: 0.5 mg via INTRAVENOUS
  Filled 2015-03-04: qty 1

## 2015-03-04 MED ORDER — LORAZEPAM 2 MG/ML IJ SOLN
0.5000 mg | Freq: Once | INTRAMUSCULAR | Status: DC
  Filled 2015-03-04: qty 1

## 2015-03-04 NOTE — ED Provider Notes (Addendum)
CSN: [id number]     Arrival date & time 03/04/15  1250 History   First MD Initiated Contact with Patient 03/04/15 1258     Chief Complaint  Patient presents with  . Fall     (Consider location/radiation/quality/duration/timing/severity/associated sxs/prior Treatment) Patient is a 81 y.o. male presenting with fall.  Fall This is a new problem. Episode onset: unable to specify. The problem occurs rarely. The problem has been resolved. Exacerbated by: unknown. Relieved by: unknown. He has tried nothing for the symptoms. The treatment provided no relief.    Past Medical History  Diagnosis Date  . Diabetes mellitus without complication   . Hypertension   . Alzheimer disease   . CAD (coronary artery disease)   . Stroke    Past Surgical History  Procedure Laterality Date  . Coronary artery bypass graft     Family History  Problem Relation Age of Onset  . Heart attack Son    Social History  Substance Use Topics  . Smoking status: Never Smoker   . Smokeless tobacco: None  . Alcohol Use: None    Review of Systems  Unable to perform ROS: Patient nonverbal      Allergies  Asa  Home Medications   Prior to Admission medications   Medication Sig Start Date End Date Taking? Authorizing Provider  amLODipine (NORVASC) 5 MG tablet Take 5 mg by mouth every morning.   Yes Historical Provider, MD  clopidogrel (PLAVIX) 75 MG tablet Take 75 mg by mouth daily with breakfast.   Yes Historical Provider, MD  dexlansoprazole (DEXILANT) 60 MG capsule Take 60 mg by mouth every morning.   Yes Historical Provider, MD  memantine (NAMENDA) 10 MG tablet Take 10 mg by mouth every morning.   Yes Historical Provider, MD  cetaphil (CETAPHIL) cream Apply 1 application topically 2 (two) times daily as needed (for dry skin areas).     Historical Provider, MD  clindamycin (CLEOCIN) 150 MG capsule Take 1 capsule (150 mg total) by mouth 4 (four) times daily. Patient not taking: Reported on 02/16/2015 01/18/15    Courteney Lyn Mackuen, MD  fluocinonide-emollient (LIDEX-E) 0.05 % cream Apply 1 application topically 3 (three) times daily. 11/25/13   Elvina Sidle, MD  losartan (COZAAR) 50 MG tablet Take 1 tablet (50 mg total) by mouth daily. At 8 am Patient taking differently: Take 50 mg by mouth daily.  02/10/14   Elvina Sidle, MD  metFORMIN (GLUCOPHAGE) 500 MG tablet Take 500 mg by mouth at bedtime.    Historical Provider, MD  QUEtiapine (SEROQUEL) 25 MG tablet Take 25 mg by mouth at bedtime.    Historical Provider, MD  traMADol (ULTRAM) 50 MG tablet Take 50 mg by mouth every 12 (twelve) hours as needed for moderate pain.    Historical Provider, MD  triamcinolone cream (KENALOG) 0.1 % Apply 1 application topically every 12 (twelve) hours as needed (for dry skin areas).    Historical Provider, MD   BP 133/66 mmHg  Pulse 110  Temp(Src) 98 F (36.7 C) (Oral)  Resp 29  SpO2 100% Physical Exam  Constitutional: He is oriented to person, place, and time. He appears well-developed and well-nourished.  HENT:  Head: Normocephalic and atraumatic.  Eyes: Conjunctivae and EOM are normal.  Neck: Normal range of motion. Neck supple.  Cardiovascular: Normal rate and regular rhythm.   Pulmonary/Chest: Effort normal. No respiratory distress.  Abdominal: Soft. There is no tenderness.  Musculoskeletal: Normal range of motion. He exhibits tenderness (only on foot  around ulcer, none in back, abdomen, chest or other extremities). He exhibits no edema.  Neurological: He is alert and oriented to person, place, and time.  R>L arm and leg weakness No obvious CN abnormalities Not able to follow commands well otherwise  Skin: Skin is warm and dry.  2 cm ulcer on left heel, appears well healing without obvious surrounding infection.   Nursing note and vitals reviewed.   ED Course  Procedures (including critical care time) Labs Review Labs Reviewed  COMPREHENSIVE METABOLIC PANEL - Abnormal; Notable for the  following:    CO2 21 (*)    Glucose, Bld 141 (*)    Calcium 8.5 (*)    Albumin 3.3 (*)    All other components within normal limits  CK  TROPONIN I  URINALYSIS, ROUTINE W REFLEX MICROSCOPIC (NOT AT [facility])    Imaging Review No results found. I have personally reviewed and evaluated these images and lab results as part of my medical decision-making.   EKG Interpretation   Date/Time:  Saturday March 04 2015 13:01:24 EDT Ventricular Rate:  97 PR Interval:  141 QRS Duration: 71 QT Interval:  334 QTC Calculation: 424 R Axis:   56 Text Interpretation:  Sinus rhythm Abnormal R-wave progression, early  transition Minimal ST depression, inferior leads Artifact in lead(s) I aVR  aVL aVF V1 V2 V3 V4 V5 V6 No significant change since last tracing  Confirmed by [facility] MD, Barbara Cower [phone]) on 03/04/2015 3:08:20 PM      MDM   Final diagnoses:  Fall, initial encounter   81 yo M w/ h/o cva w/ right sided deficits, alzheimers and multiple falls here with another fall. A&O on arrival. Small hematoma to posterior scalp, no stepoff. Exam with decreased strength in his right upper and lower extremities. Was his daughter I she confirm that this was his baseline. She also stated that he is at his baseline mental status and speech status. Patient without any tenderness anywhere. Not on any blood thinners. Initially planned to do x-rays and CT scans that patient's daughter refused them. She says she preferred we observe the patient and if any change then go on with imaging. Discussed with her that we could be missing a head bleed or significant injury and she stated she didn't think this was likely and was willing to take that risk. Patient was observed for approximately 3 hours still without any tenderness. No mental status changes. Vital signs within normal limits. No active bleeding. Able to bear weight on both extremities but decreased on right with his pressure ulcer. We'll discharge back to nursing  facility per his daughter's request.  I have personally and contemperaneously reviewed labs and imaging and used in my decision making as above.   A medical screening exam was performed and I feel the patient has had an appropriate workup for their chief complaint at this time and likelihood of emergent condition existing is low. They have been counseled on decision, discharge, follow up and which symptoms necessitate immediate return to the emergency department. They or their family verbally stated understanding and agreement with plan and discharged in stable condition.      Marily Memos, MD 03/04/15 1546  Marily Memos, MD 03/04/15 [phone]

## 2015-03-04 NOTE — ED Notes (Signed)
From [facility] on [address]. Found on floor today in room with w/c on top of him. Hematoma to posterior scalp. No tenderness noted by EMS.   Per report ambulates with assist at baseline. Nonverbal with right sided weakness at baseline following CVA. Diabetic Ulcer to right foot currently taking oral abx.

## 2015-03-04 NOTE — ED Notes (Signed)
Continue awaiting PTAR.  Sitter placed at bedside d/t pt continues to try to get out of bed.  Family spoke with pt but this does not calm pt anymore.

## 2015-03-04 NOTE — ED Notes (Signed)
PTAR called [phone].

## 2015-03-04 NOTE — ED Notes (Signed)
Was notified by radiology that pt would not sit still.  MD notified.

## 2015-03-04 NOTE — ED Notes (Signed)
Attempted to call report to [facility], no answer. Left message  PtAR called for transport.

## 2015-03-04 NOTE — ED Notes (Signed)
FAmily at bedside.  State pt is at his norm.

## 2015-03-05 ENCOUNTER — Emergency Department (HOSPITAL_COMMUNITY): Payer: Medicare Other

## 2015-03-05 ENCOUNTER — Encounter (HOSPITAL_COMMUNITY): Payer: Self-pay | Admitting: Emergency Medicine

## 2015-03-05 ENCOUNTER — Emergency Department (HOSPITAL_COMMUNITY)
Admission: EM | Admit: 2015-03-05 | Discharge: 2015-03-05 | Disposition: A | Discharge status: Home / Self Care | Payer: Medicare Other | Attending: Emergency Medicine | Admitting: Emergency Medicine

## 2015-03-05 DIAGNOSIS — Z951 Presence of aortocoronary bypass graft: Secondary | ICD-10-CM | POA: Insufficient documentation

## 2015-03-05 DIAGNOSIS — Y9389 Activity, other specified: Secondary | ICD-10-CM | POA: Insufficient documentation

## 2015-03-05 DIAGNOSIS — Y92128 Other place in nursing home as the place of occurrence of the external cause: Secondary | ICD-10-CM | POA: Diagnosis not present

## 2015-03-05 DIAGNOSIS — S01111A Laceration without foreign body of right eyelid and periocular area, initial encounter: Secondary | ICD-10-CM | POA: Diagnosis not present

## 2015-03-05 DIAGNOSIS — E119 Type 2 diabetes mellitus without complications: Secondary | ICD-10-CM | POA: Insufficient documentation

## 2015-03-05 DIAGNOSIS — Z7902 Long term (current) use of antithrombotics/antiplatelets: Secondary | ICD-10-CM | POA: Insufficient documentation

## 2015-03-05 DIAGNOSIS — F0280 Dementia in other diseases classified elsewhere without behavioral disturbance: Secondary | ICD-10-CM | POA: Insufficient documentation

## 2015-03-05 DIAGNOSIS — Z8673 Personal history of transient ischemic attack (TIA), and cerebral infarction without residual deficits: Secondary | ICD-10-CM | POA: Diagnosis not present

## 2015-03-05 DIAGNOSIS — Z79899 Other long term (current) drug therapy: Secondary | ICD-10-CM | POA: Diagnosis not present

## 2015-03-05 DIAGNOSIS — Y998 Other external cause status: Secondary | ICD-10-CM | POA: Insufficient documentation

## 2015-03-05 DIAGNOSIS — Z792 Long term (current) use of antibiotics: Secondary | ICD-10-CM | POA: Diagnosis not present

## 2015-03-05 DIAGNOSIS — S065XAA Traumatic subdural hemorrhage with loss of consciousness status unknown, initial encounter: Secondary | ICD-10-CM

## 2015-03-05 DIAGNOSIS — S0990XA Unspecified injury of head, initial encounter: Secondary | ICD-10-CM | POA: Diagnosis not present

## 2015-03-05 DIAGNOSIS — I2510 Atherosclerotic heart disease of native coronary artery without angina pectoris: Secondary | ICD-10-CM | POA: Insufficient documentation

## 2015-03-05 DIAGNOSIS — S065X0A Traumatic subdural hemorrhage without loss of consciousness, initial encounter: Secondary | ICD-10-CM | POA: Diagnosis not present

## 2015-03-05 DIAGNOSIS — W19XXXA Unspecified fall, initial encounter: Secondary | ICD-10-CM

## 2015-03-05 DIAGNOSIS — W01198A Fall on same level from slipping, tripping and stumbling with subsequent striking against other object, initial encounter: Secondary | ICD-10-CM | POA: Insufficient documentation

## 2015-03-05 DIAGNOSIS — I10 Essential (primary) hypertension: Secondary | ICD-10-CM | POA: Insufficient documentation

## 2015-03-05 DIAGNOSIS — G309 Alzheimer's disease, unspecified: Secondary | ICD-10-CM | POA: Insufficient documentation

## 2015-03-05 DIAGNOSIS — S065X9A Traumatic subdural hemorrhage with loss of consciousness of unspecified duration, initial encounter: Secondary | ICD-10-CM

## 2015-03-05 MED ORDER — LIDOCAINE-EPINEPHRINE 2 %-1:100000 IJ SOLN
20.0000 mL | Freq: Once | INTRAMUSCULAR | Status: AC
  Administered 2015-03-05: 20 mL via INTRADERMAL
  Filled 2015-03-05: qty 1

## 2015-03-05 MED ORDER — LORAZEPAM 1 MG PO TABS
1.0000 mg | ORAL_TABLET | Freq: Once | ORAL | Status: AC
  Administered 2015-03-05: 1 mg via ORAL
  Filled 2015-03-05: qty 1

## 2015-03-05 NOTE — ED Provider Notes (Signed)
CSN: [id number]     Arrival date & time 03/05/15  6295 History   First MD Initiated Contact with Patient 03/05/15 0830     Chief Complaint  Patient presents with  . Fall     (Consider location/radiation/quality/duration/timing/severity/associated sxs/prior Treatment) The history is provided by the patient and the nursing home.   81 year old male from nursing facility evaluated at [facility] yesterday for fall. Patient with new fall today. The report from the nursing facility says the patient fell while ambulating with walker hitting his head. Has a laceration above the right eye. Patient has a history of Alzheimer's dementia.  Level V caveat applies due to the history of Alzheimer's disease.  Past Medical History  Diagnosis Date  . Diabetes mellitus without complication   . Hypertension   . Alzheimer disease   . CAD (coronary artery disease)   . Stroke    Past Surgical History  Procedure Laterality Date  . Coronary artery bypass graft     Family History  Problem Relation Age of Onset  . Heart attack Son    Social History  Substance Use Topics  . Smoking status: Never Smoker   . Smokeless tobacco: None  . Alcohol Use: No    Review of Systems  Unable to perform ROS  level V caveat applies due to Alzheimer's.    Allergies  Asa  Home Medications   Prior to Admission medications   Medication Sig Start Date End Date Taking? Authorizing Provider  amLODipine (NORVASC) 5 MG tablet Take 5 mg by mouth every morning.   Yes Historical Provider, MD  cetaphil (CETAPHIL) cream Apply 1 application topically 2 (two) times daily as needed (for dry skin areas).    Yes Historical Provider, MD  clindamycin (CLEOCIN) 150 MG capsule Take 1 capsule (150 mg total) by mouth 4 (four) times daily. 01/18/15  Yes Courteney Lyn Mackuen, MD  clopidogrel (PLAVIX) 75 MG tablet Take 75 mg by mouth daily with breakfast.   Yes Historical Provider, MD  dexlansoprazole (DEXILANT) 60 MG capsule Take 60 mg by  mouth every morning.   Yes Historical Provider, MD  fluocinonide-emollient (LIDEX-E) 0.05 % cream Apply 1 application topically 3 (three) times daily. 11/25/13  Yes Elvina Sidle, MD  losartan (COZAAR) 50 MG tablet Take 1 tablet (50 mg total) by mouth daily. At 8 am Patient taking differently: Take 50 mg by mouth daily.  02/10/14  Yes Elvina Sidle, MD  memantine (NAMENDA) 10 MG tablet Take 10 mg by mouth every morning.   Yes Historical Provider, MD  metFORMIN (GLUCOPHAGE) 500 MG tablet Take 500 mg by mouth at bedtime.   Yes Historical Provider, MD  QUEtiapine (SEROQUEL) 25 MG tablet Take 25 mg by mouth at bedtime.   Yes Historical Provider, MD  traMADol (ULTRAM) 50 MG tablet Take 50 mg by mouth every 6 (six) hours as needed for moderate pain.    Yes Historical Provider, MD  triamcinolone cream (KENALOG) 0.1 % Apply 1 application topically every 12 (twelve) hours as needed (for dry skin areas).   Yes Historical Provider, MD   BP 139/80 mmHg  Pulse 95  Temp(Src) 98.6 F (37 C) (Oral)  Resp 18  SpO2 96% Physical Exam  Constitutional: He appears well-developed and well-nourished. No distress.  HENT:  3 cm laceration above right eyebrow somewhat irregular bleeding controlled.  Eyes: Conjunctivae and EOM are normal. Pupils are equal, round, and reactive to light.  Neck: Normal range of motion.  Cardiovascular: Normal rate and normal heart  sounds.   Pulmonary/Chest: Effort normal and breath sounds normal. No respiratory distress.  Abdominal: Soft. Bowel sounds are normal. There is no tenderness.  Neurological: He is alert.  Patient alert but not oriented. Patient moving all 4 extremities but not cooperative to an extensive neuro exam.  Skin: Skin is warm. No rash noted.  Nursing note and vitals reviewed.   ED Course  Procedures (including critical care time) Labs Review Labs Reviewed - No data to display  Imaging Review Ct Head Wo Contrast  03/05/2015   CLINICAL DATA:  Larey Seat while  walking and hit head with laceration to left eyebrow and abrasion to left cheek  EXAM: CT HEAD WITHOUT CONTRAST  CT CERVICAL SPINE WITHOUT CONTRAST  TECHNIQUE: Multidetector CT imaging of the head and cervical spine was performed following the standard protocol without intravenous contrast. Multiplanar CT image reconstructions of the cervical spine were also generated.  COMPARISON:  02/16/2015  FINDINGS: CT HEAD FINDINGS  Old left MCA infarct stable. No evidence of mass or acute vascular territory infarct. Lacunar infarct posterior internal capsule on the left stable. Old infarct right cerebellar hemisphere stable.  Small acute subdural hematoma involving the falx above the level of the ventricles, measuring 7 mm in maximal diameter. No other foci or extra-axial fluid. No parenchymal hematoma.  Laceration over the right orbit.  No skull fracture.  CT CERVICAL SPINE FINDINGS  Sub cm right thyroid nodule. Diffuse vascular calcification. No prevertebral soft tissue swelling. No acute soft tissue abnormalities. Partially visualized pleural parenchymal opacity right lung apex. This is stable from the prior study.  Large C3-4 disc osteophytic bulge creating moderate canal narrowing. Normal it anterior posterior alignment. Severe multilevel degenerative disc disease. No fractures.  IMPRESSION: 1. Numerous chronic findings in the brain 2. Small acute subdural hematoma. Critical Value/emergent results were called by telephone at the time of interpretation on 03/05/2015 at 9:26 am to Dr. Vanetta Mulders , who verbally acknowledged these results.  3.  No acute cervical spine findings.   Electronically Signed   By: Esperanza Heir M.D.   On: 03/05/2015 09:29   Ct Cervical Spine Wo Contrast  03/05/2015   CLINICAL DATA:  Larey Seat while walking and hit head with laceration to left eyebrow and abrasion to left cheek  EXAM: CT HEAD WITHOUT CONTRAST  CT CERVICAL SPINE WITHOUT CONTRAST  TECHNIQUE: Multidetector CT imaging of the head  and cervical spine was performed following the standard protocol without intravenous contrast. Multiplanar CT image reconstructions of the cervical spine were also generated.  COMPARISON:  02/16/2015  FINDINGS: CT HEAD FINDINGS  Old left MCA infarct stable. No evidence of mass or acute vascular territory infarct. Lacunar infarct posterior internal capsule on the left stable. Old infarct right cerebellar hemisphere stable.  Small acute subdural hematoma involving the falx above the level of the ventricles, measuring 7 mm in maximal diameter. No other foci or extra-axial fluid. No parenchymal hematoma.  Laceration over the right orbit.  No skull fracture.  CT CERVICAL SPINE FINDINGS  Sub cm right thyroid nodule. Diffuse vascular calcification. No prevertebral soft tissue swelling. No acute soft tissue abnormalities. Partially visualized pleural parenchymal opacity right lung apex. This is stable from the prior study.  Large C3-4 disc osteophytic bulge creating moderate canal narrowing. Normal it anterior posterior alignment. Severe multilevel degenerative disc disease. No fractures.  IMPRESSION: 1. Numerous chronic findings in the brain 2. Small acute subdural hematoma. Critical Value/emergent results were called by telephone at the time of interpretation on 03/05/2015  at 9:26 am to Dr. Vanetta Mulders , who verbally acknowledged these results.  3.  No acute cervical spine findings.   Electronically Signed   By: Esperanza Heir M.D.   On: 03/05/2015 09:29   Dg Hips Bilat With Pelvis Min 5 Views  03/05/2015   CLINICAL DATA:  Fall today; hx alzheimer, pt was not able to give tech any hx; head laceration; falling while ambulating with walker and hitting head on walker  EXAM: DG HIP (WITH OR WITHOUT PELVIS) 5+V BILAT  COMPARISON:  06/24/2013  FINDINGS: There is no evidence of hip fracture or dislocation. There is no evidence of arthropathy or other focal bone abnormality. Extensive bilateral iliofemoral arterial  calcifications. Spondylitic changes in the visualized lower lumbar spine. Left pelvic phlebolith.  IMPRESSION: 1. Negative bilateral hips. 2. Lower lumbar spondylitic changes.   Electronically Signed   By: Corlis Leak M.D.   On: 03/05/2015 09:11   I have personally reviewed and evaluated these images and lab results as part of my medical decision-making.   EKG Interpretation None      MDM   Final diagnoses:  Fall, initial encounter  Eyebrow laceration, right, initial encounter  Head injury, initial encounter  Subdural hematoma   Patient status post fall at nursing facility neck she was evaluated for fall yesterday so patient is extreme risk for falling. Today's fall resulted in a 3 cm laceration above the right eyebrow. This was repaired with sutures. Patient also had a head injury CT scan showed small subdural. This was discussed with neurosurgery on call Dr. Danielle Dess. Patient cleared to return back to nursing facility since it such a small area of blood. Follow-up with primary care doctor would be appropriate. Patient also had CT of neck which showed no injuries. Patient had x-rays of both hips and pelvis showed no injuries. Patient with old injury to the right heel from yesterday's fall.  Patient cleared to return back to nursing facility.     Vanetta Mulders, MD 03/05/15 1235

## 2015-03-05 NOTE — ED Notes (Signed)
Resting quietly with eye closed. Easily arousable. Verbally responsive. Resp even and unlabored. ABC's intact. Dsg intact to lac to rt eyebrow with controlled bleeding noted.

## 2015-03-05 NOTE — ED Notes (Signed)
Awake. Verbally responsive. A/O. Resp even and unlabored. No audible adventitious breath sounds noted. ABC's intact.  

## 2015-03-05 NOTE — ED Notes (Signed)
Awake. Verbally responsive. A/O. Resp even and unlabored. No audible adventitious breath sounds noted. ABC's intact. Sutures intact to rt elbow. Nonstick dry dsg applied.

## 2015-03-05 NOTE — ED Notes (Addendum)
Pt arrived via EMS from [facility] with report of falling while ambulating with walker and hitting head on walker causing lac to lt eyebrow area and abrasion to lt cheek with controlled bleeding. Pt was seen at [facility] yesterday for fall at facility with skin tears from previous fall noted. EMS reported that pt's roommate stated the pt was restless during the night.

## 2015-03-05 NOTE — ED Notes (Signed)
Bed: ON62 Expected date:  Expected time:  Means of arrival:  Comments: Fall w/ head lac

## 2015-03-05 NOTE — ED Notes (Signed)
Pt taken to xray 

## 2015-03-05 NOTE — ED Notes (Signed)
1.5 mg of IV Ativan wasted with Trinda Pascal RN.

## 2015-03-05 NOTE — ED Notes (Signed)
Resting quietly with eye closed. Easily arousable. Verbally responsive. Resp even and unlabored. ABC's intact. NAD noted.  

## 2015-03-05 NOTE — ED Notes (Signed)
Pt given ice chips in order to calm him down. Very effective, patient now resting with his eyes closed.

## 2015-03-05 NOTE — ED Provider Notes (Signed)
LACERATION REPAIR Performed by: Barrett Henle Authorized by: Barrett Henle Consent: Verbal consent obtained. Risks and benefits: risks, benefits and alternatives were discussed Consent given by: patient Patient identity confirmed: provided demographic data Prepped and Draped in normal sterile fashion Wound explored  Laceration Location: right later eyebrow   Laceration Length: 3cm  No Foreign Bodies seen or palpated  Anesthesia: local infiltration  Local anesthetic: lidocaine 2% with epinephrine  Anesthetic total: 3 ml  Irrigation method: syringe Amount of cleaning: standard  Skin closure: 5-0 prolene  Number of sutures: 5  Technique: simple interrupted  Patient tolerance: Patient tolerated the procedure well with no immediate complications.   Jonathan Richards [place], [place] 03/05/15 1215  Vanetta Mulders, MD 03/05/15 1235

## 2015-03-05 NOTE — ED Notes (Signed)
Communications called for transport back to facility.

## 2015-03-05 NOTE — ED Notes (Signed)
PA at bedside to suture lac to rt eyebrow. Pt tolerated well.

## 2015-03-05 NOTE — Discharge Instructions (Signed)
Fall that occurred today resulted in a laceration above the right eyebrow. This has remained in repaired with sutures and sutures can be removed and now 5-7 days. Keep the area clean and dry at least for 48 hours. X-rays of the hips and pelvis were negative for any injury. CAT scan of the neck was negative for any injury. CAT scan of the head showed a very small subdural hematoma. This was reviewed by neurosurgery Dr. Danielle Dess and patient cleared to return back to nursing facility. Fall precautions needed.

## 2015-03-05 NOTE — ED Notes (Signed)
Awake. Verbally responsive. A/O. Resp even and unlabored. No audible adventitious breath sounds noted. ABC's intact. Pt noted yelling out at times and moving around in bed. Dsg intact to rt eyebrow and rt foot.

## 2015-03-05 NOTE — ED Notes (Signed)
1.5 mg ativan wasted with RN Barnie Del.  Pt was discharged yesterday and we both took care of pt.

## 2015-03-05 NOTE — ED Notes (Signed)
PTAR arrived to transport to [facility]. Attempted to call facility to give report but no answer.

## 2015-03-14 ENCOUNTER — Encounter (HOSPITAL_COMMUNITY): Payer: Self-pay | Admitting: Emergency Medicine

## 2015-03-14 ENCOUNTER — Emergency Department (HOSPITAL_COMMUNITY)
Admission: EM | Admit: 2015-03-14 | Discharge: 2015-03-14 | Disposition: A | Discharge status: Home / Self Care | Payer: Medicare Other | Attending: Emergency Medicine | Admitting: Emergency Medicine

## 2015-03-14 ENCOUNTER — Emergency Department (HOSPITAL_COMMUNITY): Payer: Medicare Other

## 2015-03-14 DIAGNOSIS — Y92128 Other place in nursing home as the place of occurrence of the external cause: Secondary | ICD-10-CM | POA: Diagnosis not present

## 2015-03-14 DIAGNOSIS — L97429 Non-pressure chronic ulcer of left heel and midfoot with unspecified severity: Secondary | ICD-10-CM | POA: Insufficient documentation

## 2015-03-14 DIAGNOSIS — I10 Essential (primary) hypertension: Secondary | ICD-10-CM | POA: Insufficient documentation

## 2015-03-14 DIAGNOSIS — Y998 Other external cause status: Secondary | ICD-10-CM | POA: Diagnosis not present

## 2015-03-14 DIAGNOSIS — Y9389 Activity, other specified: Secondary | ICD-10-CM | POA: Diagnosis not present

## 2015-03-14 DIAGNOSIS — G309 Alzheimer's disease, unspecified: Secondary | ICD-10-CM | POA: Insufficient documentation

## 2015-03-14 DIAGNOSIS — S0990XA Unspecified injury of head, initial encounter: Secondary | ICD-10-CM | POA: Diagnosis not present

## 2015-03-14 DIAGNOSIS — Z79899 Other long term (current) drug therapy: Secondary | ICD-10-CM | POA: Diagnosis not present

## 2015-03-14 DIAGNOSIS — Z4802 Encounter for removal of sutures: Secondary | ICD-10-CM | POA: Insufficient documentation

## 2015-03-14 DIAGNOSIS — Z951 Presence of aortocoronary bypass graft: Secondary | ICD-10-CM | POA: Insufficient documentation

## 2015-03-14 DIAGNOSIS — Z7902 Long term (current) use of antithrombotics/antiplatelets: Secondary | ICD-10-CM | POA: Diagnosis not present

## 2015-03-14 DIAGNOSIS — S0083XA Contusion of other part of head, initial encounter: Secondary | ICD-10-CM | POA: Insufficient documentation

## 2015-03-14 DIAGNOSIS — W01198A Fall on same level from slipping, tripping and stumbling with subsequent striking against other object, initial encounter: Secondary | ICD-10-CM | POA: Diagnosis not present

## 2015-03-14 DIAGNOSIS — Z8673 Personal history of transient ischemic attack (TIA), and cerebral infarction without residual deficits: Secondary | ICD-10-CM | POA: Diagnosis not present

## 2015-03-14 DIAGNOSIS — I2510 Atherosclerotic heart disease of native coronary artery without angina pectoris: Secondary | ICD-10-CM | POA: Diagnosis not present

## 2015-03-14 DIAGNOSIS — E119 Type 2 diabetes mellitus without complications: Secondary | ICD-10-CM | POA: Insufficient documentation

## 2015-03-14 LAB — CBC WITH DIFFERENTIAL/PLATELET
BASOS ABS: 0 10*3/uL (ref 0.0–0.1)
BASOS PCT: 0 %
EOS ABS: 0 10*3/uL (ref 0.0–0.7)
Eosinophils Relative: 0 %
HCT: 42.3 % (ref 39.0–52.0)
Hemoglobin: 14.1 g/dL (ref 13.0–17.0)
Lymphocytes Relative: 25 %
Lymphs Abs: 3.6 10*3/uL (ref 0.7–4.0)
MCH: 30.2 pg (ref 26.0–34.0)
MCHC: 33.3 g/dL (ref 30.0–36.0)
MCV: 90.6 fL (ref 78.0–100.0)
MONO ABS: 1.6 10*3/uL — AB (ref 0.1–1.0)
MONOS PCT: 11 %
NEUTROS ABS: 9.1 10*3/uL — AB (ref 1.7–7.7)
NEUTROS PCT: 64 %
Platelets: 346 10*3/uL (ref 150–400)
RBC: 4.67 MIL/uL (ref 4.22–5.81)
RDW: 14 % (ref 11.5–15.5)
WBC: 14.3 10*3/uL — ABNORMAL HIGH (ref 4.0–10.5)

## 2015-03-14 LAB — BASIC METABOLIC PANEL
ANION GAP: 6 (ref 5–15)
BUN: 18 mg/dL (ref 6–20)
CALCIUM: 9.2 mg/dL (ref 8.9–10.3)
CO2: 26 mmol/L (ref 22–32)
CREATININE: 0.93 mg/dL (ref 0.61–1.24)
Chloride: 106 mmol/L (ref 101–111)
Glucose, Bld: 127 mg/dL — ABNORMAL HIGH (ref 65–99)
Potassium: 3.6 mmol/L (ref 3.5–5.1)
SODIUM: 138 mmol/L (ref 135–145)

## 2015-03-14 LAB — URINALYSIS, ROUTINE W REFLEX MICROSCOPIC
BILIRUBIN URINE: NEGATIVE
Glucose, UA: NEGATIVE mg/dL
HGB URINE DIPSTICK: NEGATIVE
KETONES UR: NEGATIVE mg/dL
Leukocytes, UA: NEGATIVE
NITRITE: NEGATIVE
PROTEIN: NEGATIVE mg/dL
Specific Gravity, Urine: 1.008 (ref 1.005–1.030)
UROBILINOGEN UA: 0.2 mg/dL (ref 0.0–1.0)
pH: 7 (ref 5.0–8.0)

## 2015-03-14 MED ORDER — BACITRACIN ZINC 500 UNIT/GM EX OINT
TOPICAL_OINTMENT | CUTANEOUS | Status: AC
  Administered 2015-03-14: 20:00:00
  Filled 2015-03-14: qty 0.9

## 2015-03-14 NOTE — Progress Notes (Signed)
CSW attempted to meet with patient at bedside. However, the patient was asleep. Daughter in law and son were present. Both confirm that the patient is from [place]. CSW reached out to Rachel and spoke with Nurse Lanora Manis. CSW confirmed that the patient is from facility, and that he lives in the assisted living unit.   Daughter in law states that the patient presents to [facility] due to falling. Also, she states that the patient falls often. According to daughter, the patient has issues with his feet, stomach, and trouble with walking.  Family expressed interested in wanting new facility for patient. Daughter in law states that the patient has been falling frequently and that staff are not as urgent with answering the call bell. CSW offered family ombudsman. However, they declined.  Nurse CM spoke with physician and arranged home health for patient. Patient was also given a list of private duty agencies.  Trish Mage [phone] ED CSW 03/14/2015 8:58 PM 8:58 PM

## 2015-03-14 NOTE — Discharge Instructions (Signed)
Fall Prevention in Hospitals As a hospital patient, your condition and the treatments you receive can increase your risk for falls. Some additional risk factors for falls in a hospital include:  Being in an unfamiliar environment.  Being on bed rest.  Your surgery.  Taking certain medicines.  Your tubing requirements, such as intravenous (IV) therapy or catheters. It is important that you learn how to decrease fall risks while at the hospital. Below are important tips that can help prevent falls. SAFETY TIPS FOR PREVENTING FALLS Talk about your risk of falling.  Ask your caregiver why you are at risk for falling. Is it your medicine, illness, tubing placement, or something else?  Make a plan with your caregiver to keep you safe from falls.  Ask your caregiver or pharmacist about side effect of your medicines. Some medicines can make you dizzy or affect your coordination. Ask for help.  Ask for help before getting out of bed. You may need to press your call button.  Ask for assistance in getting you safely to the toilet.  Ask for a walker or cane to be put at your bedside. Ask that most of the side rails on your bed be placed up before your caregiver leaves the room.  Ask family or friends to sit with you.  Ask for things that are out of your reach, such as your glasses, hearing aids, telephone, bedside table, or call button. Follow these tips to avoid falling:  Stay lying or seated, rather than standing, while waiting for help.  Wear rubber-soled slippers or shoes whenever you walk in the hospital.  Avoid quick, sudden movements.  Change positions slowly.  Sit on the side of your bed before standing.  Stand up slowly and wait before you start to walk.  Let your caregiver know if there is a spill on the floor.  Pay careful attention to the medical equipment, electrical cords, and tubes around you.  When you need help, use your call button by your bed or in the  bathroom. Wait for one of your caregivers to help you.  If you feel dizzy or unsure of your footing, return to bed and wait for assistance.  Avoid being distracted by the TV, telephone, or another person in your room.  Do not lean or support yourself on rolling objects, such as IV poles or bedside tables. Document Released: 05/31/2000 Document Revised: 05/20/2012 Document Reviewed: 02/09/2012 [facility] Patient Information 2015 [place], [place]. This information is not intended to replace advice given to you by your health care provider. Make sure you discuss any questions you have with your health care provider.  PLEASE CHANGE THE PATIENT'S FOOT DRESSING AT LEAST EVERY 2 DAYS.

## 2015-03-14 NOTE — Care Management Note (Addendum)
Case Management Note  Patient Details  Name: Jonathan Richards MRN: [id number] Date of Birth: [birth year]  Subjective/Objective:        81 yr old male medicare, medicaid pt with PMH alzheimer, falls, frequent at [facility]  Daughter in law states they are not able to afford PDN services but is agreeing pt needs high level of care.  ED SW is pending a call back from facility staff to see if available resources for assisting pt until pt can be placed a high level of care. Daughter in law states pt is not able to feed himself, bathe nor dress himself at this time and had difficulty doing so prior to the ED visit. Discussed assisted level of care is when a pt is able to bathe, dress, feed himself and only needs assist with food and medications.  Discussed snf care is when pt is not able to feed, bathe, dress, take medications etc independently  EDP assisted with orders for home health PT, CNA and SW to assist at facility if pt is d/c back to the facility SW to assist with getting pt to a higher level of care  Dr Redmond School is not a [facility] participating provider therefore pt is not a [facility] candidate for referral    Action/Plan:  CM spoke with son and daughter in law about levels of care, disposition options from ED and from inpatient  [facility] reviewed in details medicare guidelines, home health [facility]) (length of stay in home, types of [facility] staff available, coverage, primary caregiver, up to 24 hrs before services may be started), Private duty nursing (PDN-coverage, length of stay in the home types of staff available),  assisted living (ASL- coverage, services offered) and Skilled nursing facilities (snf- coverage and services offered) . CM reviewed availability of HH SW to assist pcp to get pt to snf (if desired disposition) from the community level. CM provided pt/family with a list of [place] home health agencies and PDN.  Reviewed case information with EDP  CM called in [facility] to [facility] at  [phone] Left message     Expected Discharge Date:      Pending labs             Expected Discharge Plan:     In-House Referral:   ED SW, [facility]  Discharge planning Services    home health and PDN services options  Post Acute Care Choice:   home health and PDN services options Choice offered to:   son, daughter in law   DME Arranged:    DME Agency:     HH Arranged:   HHPT, aide, SW  HH Agency[facility]   [facility]   Status of Service:   completed   Additional Comments:  Ophelia Shoulder, RN 03/14/2015, 4:56 PM

## 2015-03-14 NOTE — ED Provider Notes (Signed)
CSN: [id number]     Arrival date & time 03/14/15  1406 History   First MD Initiated Contact with Patient 03/14/15 1501     No chief complaint on file.    (Consider location/radiation/quality/duration/timing/severity/associated sxs/prior Treatment) HPI Comments: Unwitnessed fall at nursing home with suspected head injury  Patient is a 81 y.o. male presenting with fall. The history is provided by the nursing home and a relative.  Fall This is a recurrent problem. The current episode started 12 to 24 hours ago. The problem occurs constantly. The problem has not changed since onset.Nothing aggravates the symptoms. Nothing relieves the symptoms. He has tried nothing for the symptoms.    Past Medical History  Diagnosis Date  . Diabetes mellitus without complication   . Hypertension   . Alzheimer disease   . CAD (coronary artery disease)   . Stroke    Past Surgical History  Procedure Laterality Date  . Coronary artery bypass graft     Family History  Problem Relation Age of Onset  . Heart attack Son    Social History  Substance Use Topics  . Smoking status: Never Smoker   . Smokeless tobacco: None  . Alcohol Use: No    Review of Systems  Unable to perform ROS: Patient nonverbal      Allergies  Asa  Home Medications   Prior to Admission medications   Medication Sig Start Date End Date Taking? Authorizing Provider  amLODipine (NORVASC) 5 MG tablet Take 5 mg by mouth every morning.   Yes Historical Provider, MD  atorvastatin (LIPITOR) 20 MG tablet Take 20 mg by mouth daily.   Yes Historical Provider, MD  cetaphil (CETAPHIL) cream Apply 1 application topically 2 (two) times daily as needed (for dry skin areas).    Yes Historical Provider, MD  clopidogrel (PLAVIX) 75 MG tablet Take 75 mg by mouth daily with breakfast.   Yes Historical Provider, MD  dexlansoprazole (DEXILANT) 60 MG capsule Take 60 mg by mouth every morning.   Yes Historical Provider, MD   fluocinonide-emollient (LIDEX-E) 0.05 % cream Apply 1 application topically 3 (three) times daily. 11/25/13  Yes Elvina Sidle, MD  losartan (COZAAR) 50 MG tablet Take 1 tablet (50 mg total) by mouth daily. At 8 am Patient taking differently: Take 50 mg by mouth daily.  02/10/14  Yes Elvina Sidle, MD  memantine (NAMENDA) 10 MG tablet Take 10 mg by mouth every morning.   Yes Historical Provider, MD  metFORMIN (GLUCOPHAGE) 500 MG tablet Take 500 mg by mouth at bedtime.   Yes Historical Provider, MD  QUEtiapine (SEROQUEL) 25 MG tablet Take 25 mg by mouth at bedtime.   Yes Historical Provider, MD  traMADol (ULTRAM) 50 MG tablet Take 50 mg by mouth every 6 (six) hours as needed for moderate pain.    Yes Historical Provider, MD  triamcinolone cream (KENALOG) 0.1 % Apply 1 application topically every 12 (twelve) hours as needed (for dry skin areas).   Yes Historical Provider, MD  clindamycin (CLEOCIN) 150 MG capsule Take 1 capsule (150 mg total) by mouth 4 (four) times daily. Patient not taking: Reported on 03/14/2015 01/18/15   Courteney Lyn Mackuen, MD   BP 121/74 mmHg  Pulse 127  Temp(Src) 97.4 F (36.3 C) (Axillary)  Resp 16  SpO2 98% Physical Exam  Constitutional: He appears well-developed and well-nourished. No distress.  HENT:  Head: Normocephalic. Head is with abrasion (over right maxilla) and with laceration (remote over right eyebrow with sutures in place).  Eyes: Conjunctivae are normal.  Neck: Neck supple. No tracheal deviation present.  Cardiovascular: Normal rate and regular rhythm.   Pulmonary/Chest: Effort normal. No respiratory distress.  Abdominal: Soft. He exhibits no distension.  Musculoskeletal:  contractures on right with unstageable heel ulcer, no erythema or purulent drainage  Neurological: He is alert.  Nonverbal, moving all 4 extremities  Skin: Skin is warm and dry.  Psychiatric: He is agitated.    ED Course  Procedures (including critical care time) SUTURE  REMOVAL Performed by: Lyndal Pulley  Consent: Verbal consent obtained. Patient identity confirmed: provided demographic data Time out: Immediately prior to procedure a "time out" was called to verify the correct patient, procedure, equipment, support staff and site/side marked as required.  Location details: RIGHT EYEBROW  Wound Appearance: clean  Sutures/Staples Removed: 5  Facility: sutures placed in this facility Patient tolerance: Patient tolerated the procedure well with no immediate complications.   Labs Review Labs Reviewed  CBC WITH DIFFERENTIAL/PLATELET - Abnormal; Notable for the following:    WBC 14.3 (*)    Neutro Abs 9.1 (*)    Monocytes Absolute 1.6 (*)    All other components within normal limits  BASIC METABOLIC PANEL - Abnormal; Notable for the following:    Glucose, Bld 127 (*)    All other components within normal limits  URINALYSIS, ROUTINE W REFLEX MICROSCOPIC (NOT AT [facility])    Imaging Review Ct Head Wo Contrast  03/14/2015   CLINICAL DATA:  Status post scratch the history of multiple falls. Status post fall today with a blow to the head.  EXAM: CT HEAD WITHOUT CONTRAST  TECHNIQUE: Contiguous axial images were obtained from the base of the skull through the vertex without intravenous contrast.  COMPARISON:  Head CT scan 03/05/2015.  FINDINGS: Small amount of subdural blood along the falx seen on the comparison examination has resolved. No hemorrhage, acute infarction, mass lesion, midline shift or abnormal extra-axial fluid collection is identified. Remote, large left MCA territory infarct is again seen. Remote infarction in the right cerebellum is also again identified. The brain is atrophic. No pneumocephalus or hydrocephalus. The calvarium is intact. Extensive atherosclerotic vascular disease is seen.  IMPRESSION: No acute abnormality. Small subdural hemorrhage seen on the most recent examination has resolved.  Atrophy, chronic microvascular ischemic change  remote infarcts.   Electronically Signed   By: Drusilla Kanner M.D.   On: 03/14/2015 16:44   I have personally reviewed and evaluated these images and lab results as part of my medical decision-making.   EKG Interpretation None      MDM   Final diagnoses:  Closed head injury, initial encounter  Visit for suture removal   81 year old male with history of severe left body stroke remotely and recurrent falls living in assisted living facility presents with another fall where he sustained abrasions to the right side of his face. He had sutures placed here 10 days ago with instructions to remove them 5 days ago and did not follow-up. Sutures are removed without difficulty as above. CT head unremarkable for acute trauma with resolving subdural from previous emergency department visit. Family concerned about the patient's left heel ulcer that is unstageable, does not appear acutely infected, and frequent dressing changes were recommended. With the patient's ongoing deterioration in his current status he will likely require skilled nursing facility placement but does not meet inpatient criteria for admission at this time. ED case manager helped initiate resources for the patient to receive ongoing nursing care, social work  and other treatments. Family was instructed on helping with placement of patient to appropriate level of care. Plan to follow up with PCP as needed and return precautions discussed for worsening or new concerning symptoms.     Lyndal Pulley, MD 03/15/15 [phone]

## 2015-03-14 NOTE — ED Notes (Signed)
Bed: WA09 Expected date:  Expected time:  Means of arrival:  Comments: EMS- 81yo M, fall, head lac/ankle pain/no thinners

## 2015-03-14 NOTE — Progress Notes (Addendum)
ED CM spoke with EDP and ED RN after noting CM consult Pt from [facility] facility per family but they are not sure what level of care (Independent living facility, Assisted living facility or skilled nursing facility) he is at. Explained to family that there are different levels of care at most facilities and pt may be assessed to see if he is at the correct level of care by ED SW. CM sent SW a message  Family voiced concern about pt frequent falls, pt "painful" right foot noted to be covered in ace wrap.  Pt noted while CM in the room, constantly attempting to get up out of the bed. Male and male family member assisting to keep pt in bed.   Pt speaks vietnamese but family able to interpret

## 2015-03-14 NOTE — ED Notes (Signed)
Per [facility] pt from [facility] off [address]. Pt has Hx of  multiple falls. Pt fell again today hit head , no loc per staff . No anticoagulant. Per ems per staff pt alert and oriented per normal. Pt speaks no English. Pt presents with facial stitched incision from previous fall. Bleeding been controled. Pt alert upon arrival.

## 2015-03-14 NOTE — ED Notes (Signed)
Bacitracin applied to abrasions after MD removed stitches.  Pt's family verbalized understanding of dc instructions and follow up care. Tolerated po water and 1/2 [place] sandwich. Assisted to car via wheelchair.

## 2015-03-14 NOTE — ED Notes (Addendum)
Called PT's son and encouraged him to come for assistance since staff are unable to communicate with pt , neither the interpreter line could help. Son stated that a family member are on their way to the hospital for assistance.

## 2015-03-23 ENCOUNTER — Emergency Department (HOSPITAL_COMMUNITY)
Admission: EM | Admit: 2015-03-23 | Discharge: 2015-03-23 | Disposition: A | Discharge status: Home / Self Care | Payer: Medicare Other | Attending: Emergency Medicine | Admitting: Emergency Medicine

## 2015-03-23 ENCOUNTER — Emergency Department (HOSPITAL_COMMUNITY): Payer: Medicare Other

## 2015-03-23 ENCOUNTER — Encounter (HOSPITAL_COMMUNITY): Payer: Self-pay | Admitting: Emergency Medicine

## 2015-03-23 DIAGNOSIS — W19XXXA Unspecified fall, initial encounter: Secondary | ICD-10-CM

## 2015-03-23 DIAGNOSIS — Z7902 Long term (current) use of antithrombotics/antiplatelets: Secondary | ICD-10-CM | POA: Insufficient documentation

## 2015-03-23 DIAGNOSIS — I2510 Atherosclerotic heart disease of native coronary artery without angina pectoris: Secondary | ICD-10-CM | POA: Diagnosis not present

## 2015-03-23 DIAGNOSIS — W1839XA Other fall on same level, initial encounter: Secondary | ICD-10-CM | POA: Insufficient documentation

## 2015-03-23 DIAGNOSIS — S0993XA Unspecified injury of face, initial encounter: Secondary | ICD-10-CM | POA: Diagnosis present

## 2015-03-23 DIAGNOSIS — G309 Alzheimer's disease, unspecified: Secondary | ICD-10-CM | POA: Diagnosis not present

## 2015-03-23 DIAGNOSIS — Z8673 Personal history of transient ischemic attack (TIA), and cerebral infarction without residual deficits: Secondary | ICD-10-CM | POA: Insufficient documentation

## 2015-03-23 DIAGNOSIS — Z79899 Other long term (current) drug therapy: Secondary | ICD-10-CM | POA: Insufficient documentation

## 2015-03-23 DIAGNOSIS — Y998 Other external cause status: Secondary | ICD-10-CM | POA: Insufficient documentation

## 2015-03-23 DIAGNOSIS — I10 Essential (primary) hypertension: Secondary | ICD-10-CM | POA: Diagnosis not present

## 2015-03-23 DIAGNOSIS — Y9389 Activity, other specified: Secondary | ICD-10-CM | POA: Insufficient documentation

## 2015-03-23 DIAGNOSIS — E119 Type 2 diabetes mellitus without complications: Secondary | ICD-10-CM | POA: Insufficient documentation

## 2015-03-23 DIAGNOSIS — Y9289 Other specified places as the place of occurrence of the external cause: Secondary | ICD-10-CM | POA: Insufficient documentation

## 2015-03-23 DIAGNOSIS — S0083XA Contusion of other part of head, initial encounter: Secondary | ICD-10-CM | POA: Diagnosis not present

## 2015-03-23 DIAGNOSIS — S00211A Abrasion of right eyelid and periocular area, initial encounter: Secondary | ICD-10-CM | POA: Diagnosis not present

## 2015-03-23 LAB — URINALYSIS, ROUTINE W REFLEX MICROSCOPIC
Bilirubin Urine: NEGATIVE
Glucose, UA: NEGATIVE mg/dL
Hgb urine dipstick: NEGATIVE
Ketones, ur: NEGATIVE mg/dL
Leukocytes, UA: NEGATIVE
Nitrite: NEGATIVE
Protein, ur: NEGATIVE mg/dL
Specific Gravity, Urine: 1.011 (ref 1.005–1.030)
Urobilinogen, UA: 0.2 mg/dL (ref 0.0–1.0)
pH: 7.5 (ref 5.0–8.0)

## 2015-03-23 MED ORDER — LORAZEPAM 2 MG/ML IJ SOLN
1.0000 mg | Freq: Once | INTRAMUSCULAR | Status: AC
  Administered 2015-03-23: 1 mg via INTRAMUSCULAR
  Filled 2015-03-23: qty 1

## 2015-03-23 MED ORDER — MORPHINE SULFATE (PF) 4 MG/ML IV SOLN
4.0000 mg | Freq: Once | INTRAVENOUS | Status: AC
  Administered 2015-03-23: 4 mg via INTRAMUSCULAR
  Filled 2015-03-23: qty 1

## 2015-03-23 NOTE — ED Provider Notes (Signed)
CSN: [id number]     Arrival date & time 03/23/15  1705 History   First MD Initiated Contact with Patient 03/23/15 1707     Chief Complaint  Patient presents with  . Fall     (Consider location/radiation/quality/duration/timing/severity/associated sxs/prior Treatment) HPI   81yM sent from NH for evaluation after falls. Two unwitnessed today. Evidence of acute and subacute appearing facial trauma. No reported LOC. No vomiting. Pt has advanced dementia and unable to provide much useful hisotry.    Past Medical History  Diagnosis Date  . Diabetes mellitus without complication (HCC)   . Hypertension   . Alzheimer disease   . CAD (coronary artery disease)   . Stroke [facility])    Past Surgical History  Procedure Laterality Date  . Coronary artery bypass graft     Family History  Problem Relation Age of Onset  . Heart attack Son    Social History  Substance Use Topics  . Smoking status: Never Smoker   . Smokeless tobacco: None  . Alcohol Use: No    Review of Systems  Level 5 caveat because of dementia.   Allergies  Asa  Home Medications   Prior to Admission medications   Medication Sig Start Date End Date Taking? Authorizing Provider  amLODipine (NORVASC) 5 MG tablet Take 5 mg by mouth every morning.    Historical Provider, MD  atorvastatin (LIPITOR) 20 MG tablet Take 20 mg by mouth daily.    Historical Provider, MD  cetaphil (CETAPHIL) cream Apply 1 application topically 2 (two) times daily as needed (for dry skin areas).     Historical Provider, MD  clindamycin (CLEOCIN) 150 MG capsule Take 1 capsule (150 mg total) by mouth 4 (four) times daily. Patient not taking: Reported on 03/14/2015 01/18/15   Courteney Lyn Mackuen, MD  clopidogrel (PLAVIX) 75 MG tablet Take 75 mg by mouth daily with breakfast.    Historical Provider, MD  dexlansoprazole (DEXILANT) 60 MG capsule Take 60 mg by mouth every morning.    Historical Provider, MD  fluocinonide-emollient (LIDEX-E) 0.05 %  cream Apply 1 application topically 3 (three) times daily. 11/25/13   Elvina Sidle, MD  losartan (COZAAR) 50 MG tablet Take 1 tablet (50 mg total) by mouth daily. At 8 am Patient taking differently: Take 50 mg by mouth daily.  02/10/14   Elvina Sidle, MD  memantine (NAMENDA) 10 MG tablet Take 10 mg by mouth every morning.    Historical Provider, MD  metFORMIN (GLUCOPHAGE) 500 MG tablet Take 500 mg by mouth at bedtime.    Historical Provider, MD  QUEtiapine (SEROQUEL) 25 MG tablet Take 25 mg by mouth at bedtime.    Historical Provider, MD  traMADol (ULTRAM) 50 MG tablet Take 50 mg by mouth every 6 (six) hours as needed for moderate pain.     Historical Provider, MD  triamcinolone cream (KENALOG) 0.1 % Apply 1 application topically every 12 (twelve) hours as needed (for dry skin areas).    Historical Provider, MD   BP 159/83 mmHg  Pulse 98  Temp(Src) 97.6 F (36.4 C) (Oral)  Resp 16  Ht  (1.702 m)  Wt 150 lb (68.04 kg)  BMI 23.49 kg/m2  SpO2 100% Physical Exam  Constitutional: He appears well-developed and well-nourished. No distress.  Sitting up in bed with eyes open. Cervical collar in place.  HENT:  Head: Normocephalic and atraumatic.  R periorbital swelling and superficial abrasions  Eyes: Conjunctivae are normal. Right eye exhibits no discharge. Left eye  exhibits no discharge.  Neck: Neck supple.  Cardiovascular: Normal rate, regular rhythm and normal heart sounds.  Exam reveals no gallop and no friction rub.   No murmur heard. Pulmonary/Chest: Effort normal and breath sounds normal. No respiratory distress.  Abdominal: Soft. He exhibits no distension. There is no tenderness.  Musculoskeletal: He exhibits no edema or tenderness.  No midline spinal tenderness. No apparent pain with palpation of extremities or apparent pain with ROM of large joints.  Neurological: He is alert.  Skin: Skin is warm and dry.  Psychiatric:  Patient appeared to be resting comfortably when I  entered the room. Shortly after I began examining him though he began yelling nonsensically. Was pushing me away at times. This seemed inconsistent though. Palpation, ranging, examination of any particular area did not seem to elicit stronger response than anywhere else.  Nursing note and vitals reviewed.   ED Course  Procedures (including critical care time) Labs Review Labs Reviewed  URINALYSIS, ROUTINE W REFLEX MICROSCOPIC (NOT AT [facility]) - Abnormal; Notable for the following:    APPearance CLOUDY (*)    All other components within normal limits    Imaging Review No results found. I have personally reviewed and evaluated these images and lab results as part of my medical decision-making.   EKG Interpretation   Date/Time:  Thursday March 23 2015 17:39:29 EDT Ventricular Rate:  104 PR Interval:  136 QRS Duration: 76 QT Interval:  327 QTC Calculation: 430 R Axis:   30 Text Interpretation:  Sinus tachycardia Probable left atrial enlargement  Low voltage, precordial leads Abnormal R-wave progression, early  transition Nonspecific repol abnormality, inferior leads Confirmed by  Juleen China  MD, Dessire Grimes ([id number]) on 03/23/2015 5:46:30 PM      MDM   Final diagnoses:  Fall, initial encounter  Facial contusion, initial encounter    81 year old male presenting after fall. Apparently 2 unwitnessed falls. There is a language barrier, but it apparently patient is nonverbal or close to it at baseline. He does have evidence of trauma to his right face. He does not seem to be tender in his cervical spine but given unreliable history, will CT cervical spine as well as the head. No apparent pain with range of motion of the large joints.    Raeford Razor, MD 03/30/15 1009

## 2015-03-23 NOTE — ED Notes (Signed)
Patient placed in clean depends. Pressure ulcers documented.

## 2015-03-23 NOTE — ED Notes (Signed)
Spoke with daughter in law via phone, patient is non verbal even with translation due to Alzhemiers. Son plans to come see patient in ED.

## 2015-03-23 NOTE — ED Notes (Signed)
PTAR called for Transport 

## 2015-03-23 NOTE — ED Notes (Signed)
Patient remains in view of nurses station

## 2015-03-23 NOTE — ED Notes (Signed)
Family at bedside, made aware of patients plan of care. Patients depends changed and patient placed back in clothes. PTAR at bedside.

## 2015-03-23 NOTE — ED Notes (Signed)
PTAR called for patient 

## 2015-03-23 NOTE — ED Notes (Signed)
Patient transported to CT at this time. 

## 2015-03-23 NOTE — Discharge Instructions (Signed)
??  ng d?p (Contusion) ??ng d?p l v?t thm tm su. ??ng d?p l k?t qu? c?a m?t t?n th??ng t?y ? cc m v s?i c? ? d??i da. T?n th??ng ? gy ch?y mu d??i da. Da bao ph? ln ch? ??ng d?p c th? chuy?n thnh mu xanh, tm ho?c vng. Ch?n th??ng nh? s? ?? l?i ph?n ??ng d?p khng ?au, nh?ng nh?ng ph?n ??ng d?p n?ng h?n c th? gy ?au ??n v s?ng n? trong m?t vi tu?n.  NGUYN NHN  Tnh tr?ng ny th??ng do m?t c ?nh, ch?n th??ng ho?c l?c tc ??ng tr?c ti?p ln m?t vng c?a c? th? gy ra. TRI?U CH?NG  Nh?ng tri?u ch?ng c?a tnh tr?ng ny bao g?m:  S?ng vng b? t?n th??ng.  ?au v nh?y c?m ?au ? vng b? th??ng.  ??i mu. Vng b? t?n th??ng c th? c mu ?? v sau ? chuy?n sang xanh, tm ho?c vng. CH?N ?ON  Tnh tr?ng ny ???c ch?n ?on d?a vo khm th?c th? v khai thc b?nh s?. C th? c?n ch?p X-quang, CT ho?c MRI ?? xc ??nh xem c b?t k? t?n th??ng no lin quan, ch?ng h?n nh? x??ng b? gy (gy x??ng). ?I?U TR?  ?i?u tr? c? th? ??i v?i tnh tr?ng ny s? ph? thu?c vo vng c? th? b? t?n th??ng. Nhn chung, ?i?u tr? t?t nh?t cho ??ng d?p l ngh? ng?i, ch??m ?, dng l?c p (p) v nng cao vng b? th??ng. Ph??ng php ny th??ng ???c g?i l chi?n l??c RICE. Thu?c ch?ng vim khng c?n k ??n c?ng c th? ???c khuyn dng ?? ki?m sot c?n ?au.  H??NG D?N CH?M [place] T?I NH   ?? vng b? t?n th??ng ngh? ng?i.  N?u ???c ch? d?n, ch??m ? l?nh vo vng b? t?n th??ng:  Cho ? l?nh vo ti nh?a.  ?? kh?n t?m ? gi?a da v ti ch??m.  Ch??m ? l?nh trong 20 pht, 2-3 l?n m?i ngy.  N?u ???c ch? d?n, p nh? vo vng b? t?n th??ng b?ng m?t b?ng chun gin. B?o ??m b?ng khng qu ch?t. Tho ra v b?ng l?i theo ch? d?n c?a chuyn gia ch?m [place] s?c kh?e.  N?u c th?, nng (nng cao) vng b? th??ng ln cao h?n tim khi qu v? ng?i ho?c n?m.  Ch? s? d?ng thu?c khng c?n k ??n v thu?c c?n k ??n theo ch? d?n c?a chuyn gia ch?m [place] s?c kh?e. ?I K[facility] N?U:  Cc tri?u ch?ng c?a qu v? khng gi?m b?t sau  vi ngy ?i?u tr?.  Tri?u ch?ng c?a qu v? tr?m tr?ng h?n.  Qu v? kh c? ??ng vng b? th??ng. NGAY L?P T?C ?I [facility] N?U:   Qu v? b? ?au r?t nhi?u.  Qu v? b? t b ? tay ho?c chn.  Tay ho?c chn qu v? tr? nn nh?t nh?t ho?c l?nh.   Thng tin ny khng nh?m m?c ?ch thay th? cho l?i khuyn m chuyn gia ch?m [place] s?c kh?e ni v?i qu v?. Hy b?o ??m qu v? ph?i th?o lu?n b?t k? v?n ?? g m qu v? c v?i chuyn gia ch?m [place] s?c kh?e c?a qu v?.   Document Released: 03/13/2005 Document Revised: 02/22/2015 Elsevier Interactive Patient Education [employer]

## 2015-03-23 NOTE — ED Notes (Signed)
Pt sent from [facility] for 2 unwitnessed falls today. Pt noted to have hematoma above right eye. Pt does not speak english and has history dementia. Pt also has bandaged wound to right foot.

## 2015-03-28 ENCOUNTER — Encounter (HOSPITAL_BASED_OUTPATIENT_CLINIC_OR_DEPARTMENT_OTHER): Payer: Medicare Other | Attending: General Surgery

## 2015-03-31 ENCOUNTER — Encounter (HOSPITAL_BASED_OUTPATIENT_CLINIC_OR_DEPARTMENT_OTHER): Payer: Medicare Other | Attending: Internal Medicine

## 2015-03-31 ENCOUNTER — Ambulatory Visit (HOSPITAL_COMMUNITY)
Admission: RE | Admit: 2015-03-31 | Discharge: 2015-03-31 | Disposition: A | Discharge status: Home / Self Care | Payer: Medicare Other | Source: Ambulatory Visit | Attending: Internal Medicine | Admitting: Internal Medicine

## 2015-03-31 ENCOUNTER — Other Ambulatory Visit: Payer: Self-pay | Admitting: Internal Medicine

## 2015-03-31 DIAGNOSIS — I10 Essential (primary) hypertension: Secondary | ICD-10-CM | POA: Insufficient documentation

## 2015-03-31 DIAGNOSIS — F0390 Unspecified dementia without behavioral disturbance: Secondary | ICD-10-CM | POA: Diagnosis not present

## 2015-03-31 DIAGNOSIS — I2510 Atherosclerotic heart disease of native coronary artery without angina pectoris: Secondary | ICD-10-CM | POA: Insufficient documentation

## 2015-03-31 DIAGNOSIS — L97409 Non-pressure chronic ulcer of unspecified heel and midfoot with unspecified severity: Secondary | ICD-10-CM | POA: Insufficient documentation

## 2015-03-31 DIAGNOSIS — E119 Type 2 diabetes mellitus without complications: Secondary | ICD-10-CM | POA: Insufficient documentation

## 2015-03-31 DIAGNOSIS — I252 Old myocardial infarction: Secondary | ICD-10-CM | POA: Insufficient documentation

## 2015-03-31 DIAGNOSIS — M7731 Calcaneal spur, right foot: Secondary | ICD-10-CM | POA: Insufficient documentation

## 2015-03-31 DIAGNOSIS — M869 Osteomyelitis, unspecified: Secondary | ICD-10-CM

## 2015-03-31 DIAGNOSIS — L97511 Non-pressure chronic ulcer of other part of right foot limited to breakdown of skin: Secondary | ICD-10-CM | POA: Insufficient documentation

## 2015-03-31 DIAGNOSIS — L89620 Pressure ulcer of left heel, unstageable: Secondary | ICD-10-CM | POA: Insufficient documentation

## 2015-04-12 ENCOUNTER — Other Ambulatory Visit: Payer: Self-pay | Admitting: Internal Medicine

## 2015-04-12 ENCOUNTER — Ambulatory Visit (HOSPITAL_COMMUNITY)
Admission: RE | Admit: 2015-04-12 | Discharge: 2015-04-12 | Disposition: A | Discharge status: Home / Self Care | Payer: Medicare Other | Source: Ambulatory Visit | Attending: Vascular Surgery | Admitting: Vascular Surgery

## 2015-04-12 DIAGNOSIS — L97319 Non-pressure chronic ulcer of right ankle with unspecified severity: Secondary | ICD-10-CM | POA: Diagnosis present

## 2015-04-14 DIAGNOSIS — I10 Essential (primary) hypertension: Secondary | ICD-10-CM | POA: Diagnosis not present

## 2015-04-14 DIAGNOSIS — L97511 Non-pressure chronic ulcer of other part of right foot limited to breakdown of skin: Secondary | ICD-10-CM | POA: Diagnosis not present

## 2015-04-14 DIAGNOSIS — E119 Type 2 diabetes mellitus without complications: Secondary | ICD-10-CM | POA: Diagnosis not present

## 2015-04-14 DIAGNOSIS — L89620 Pressure ulcer of left heel, unstageable: Secondary | ICD-10-CM | POA: Diagnosis not present

## 2015-04-19 ENCOUNTER — Encounter (HOSPITAL_COMMUNITY): Payer: Self-pay

## 2015-04-19 ENCOUNTER — Emergency Department (HOSPITAL_COMMUNITY): Payer: Medicare Other

## 2015-04-19 ENCOUNTER — Inpatient Hospital Stay (HOSPITAL_COMMUNITY)
Admission: EM | Admit: 2015-04-19 | Discharge: 2015-04-26 | DRG: 853 | Disposition: A | Discharge status: Skilled Nursing Facility | Payer: Medicare Other | Attending: Internal Medicine | Admitting: Internal Medicine

## 2015-04-19 DIAGNOSIS — D649 Anemia, unspecified: Secondary | ICD-10-CM | POA: Diagnosis not present

## 2015-04-19 DIAGNOSIS — D72829 Elevated white blood cell count, unspecified: Secondary | ICD-10-CM

## 2015-04-19 DIAGNOSIS — I10 Essential (primary) hypertension: Secondary | ICD-10-CM | POA: Diagnosis present

## 2015-04-19 DIAGNOSIS — I2510 Atherosclerotic heart disease of native coronary artery without angina pectoris: Secondary | ICD-10-CM | POA: Diagnosis present

## 2015-04-19 DIAGNOSIS — Z79899 Other long term (current) drug therapy: Secondary | ICD-10-CM

## 2015-04-19 DIAGNOSIS — G309 Alzheimer's disease, unspecified: Secondary | ICD-10-CM | POA: Diagnosis present

## 2015-04-19 DIAGNOSIS — E785 Hyperlipidemia, unspecified: Secondary | ICD-10-CM | POA: Diagnosis present

## 2015-04-19 DIAGNOSIS — E876 Hypokalemia: Secondary | ICD-10-CM | POA: Diagnosis not present

## 2015-04-19 DIAGNOSIS — E870 Hyperosmolality and hypernatremia: Secondary | ICD-10-CM | POA: Insufficient documentation

## 2015-04-19 DIAGNOSIS — Z419 Encounter for procedure for purposes other than remedying health state, unspecified: Secondary | ICD-10-CM

## 2015-04-19 DIAGNOSIS — L089 Local infection of the skin and subcutaneous tissue, unspecified: Secondary | ICD-10-CM | POA: Insufficient documentation

## 2015-04-19 DIAGNOSIS — R000 Tachycardia, unspecified: Secondary | ICD-10-CM | POA: Diagnosis present

## 2015-04-19 DIAGNOSIS — S91301A Unspecified open wound, right foot, initial encounter: Secondary | ICD-10-CM | POA: Diagnosis not present

## 2015-04-19 DIAGNOSIS — F0390 Unspecified dementia without behavioral disturbance: Secondary | ICD-10-CM

## 2015-04-19 DIAGNOSIS — Z7984 Long term (current) use of oral hypoglycemic drugs: Secondary | ICD-10-CM | POA: Diagnosis not present

## 2015-04-19 DIAGNOSIS — R627 Adult failure to thrive: Secondary | ICD-10-CM | POA: Diagnosis present

## 2015-04-19 DIAGNOSIS — B999 Unspecified infectious disease: Secondary | ICD-10-CM

## 2015-04-19 DIAGNOSIS — E11621 Type 2 diabetes mellitus with foot ulcer: Secondary | ICD-10-CM | POA: Diagnosis present

## 2015-04-19 DIAGNOSIS — F0280 Dementia in other diseases classified elsewhere without behavioral disturbance: Secondary | ICD-10-CM | POA: Diagnosis present

## 2015-04-19 DIAGNOSIS — G9340 Encephalopathy, unspecified: Secondary | ICD-10-CM | POA: Diagnosis present

## 2015-04-19 DIAGNOSIS — T148XXA Other injury of unspecified body region, initial encounter: Secondary | ICD-10-CM

## 2015-04-19 DIAGNOSIS — E119 Type 2 diabetes mellitus without complications: Secondary | ICD-10-CM | POA: Diagnosis not present

## 2015-04-19 DIAGNOSIS — I69351 Hemiplegia and hemiparesis following cerebral infarction affecting right dominant side: Secondary | ICD-10-CM | POA: Diagnosis not present

## 2015-04-19 DIAGNOSIS — L89619 Pressure ulcer of right heel, unspecified stage: Secondary | ICD-10-CM | POA: Diagnosis present

## 2015-04-19 DIAGNOSIS — Z951 Presence of aortocoronary bypass graft: Secondary | ICD-10-CM | POA: Diagnosis not present

## 2015-04-19 DIAGNOSIS — E872 Acidosis: Secondary | ICD-10-CM

## 2015-04-19 DIAGNOSIS — E1151 Type 2 diabetes mellitus with diabetic peripheral angiopathy without gangrene: Secondary | ICD-10-CM | POA: Diagnosis present

## 2015-04-19 DIAGNOSIS — E43 Unspecified severe protein-calorie malnutrition: Secondary | ICD-10-CM | POA: Diagnosis present

## 2015-04-19 DIAGNOSIS — E860 Dehydration: Secondary | ICD-10-CM | POA: Diagnosis present

## 2015-04-19 DIAGNOSIS — I70234 Atherosclerosis of native arteries of right leg with ulceration of heel and midfoot: Secondary | ICD-10-CM | POA: Diagnosis not present

## 2015-04-19 DIAGNOSIS — L97919 Non-pressure chronic ulcer of unspecified part of right lower leg with unspecified severity: Secondary | ICD-10-CM

## 2015-04-19 DIAGNOSIS — Z89611 Acquired absence of right leg above knee: Secondary | ICD-10-CM | POA: Diagnosis not present

## 2015-04-19 DIAGNOSIS — A419 Sepsis, unspecified organism: Principal | ICD-10-CM | POA: Diagnosis present

## 2015-04-19 DIAGNOSIS — L8990 Pressure ulcer of unspecified site, unspecified stage: Secondary | ICD-10-CM | POA: Insufficient documentation

## 2015-04-19 DIAGNOSIS — Z6823 Body mass index (BMI) 23.0-23.9, adult: Secondary | ICD-10-CM

## 2015-04-19 DIAGNOSIS — R1310 Dysphagia, unspecified: Secondary | ICD-10-CM | POA: Diagnosis not present

## 2015-04-19 LAB — CBC WITH DIFFERENTIAL/PLATELET
Basophils Absolute: 0 10*3/uL (ref 0.0–0.1)
Basophils Relative: 0 %
EOS PCT: 0 %
Eosinophils Absolute: 0 10*3/uL (ref 0.0–0.7)
HCT: 48.7 % (ref 39.0–52.0)
Hemoglobin: 15.8 g/dL (ref 13.0–17.0)
LYMPHS ABS: 3.8 10*3/uL (ref 0.7–4.0)
LYMPHS PCT: 21 %
MCH: 30.3 pg (ref 26.0–34.0)
MCHC: 32.4 g/dL (ref 30.0–36.0)
MCV: 93.5 fL (ref 78.0–100.0)
MONO ABS: 1.5 10*3/uL — AB (ref 0.1–1.0)
MONOS PCT: 8 %
Neutro Abs: 13.3 10*3/uL — ABNORMAL HIGH (ref 1.7–7.7)
Neutrophils Relative %: 71 %
PLATELETS: 409 10*3/uL — AB (ref 150–400)
RBC: 5.21 MIL/uL (ref 4.22–5.81)
RDW: 14 % (ref 11.5–15.5)
WBC: 18.7 10*3/uL — ABNORMAL HIGH (ref 4.0–10.5)

## 2015-04-19 LAB — PROTIME-INR
INR: 1.42 (ref 0.00–1.49)
PROTHROMBIN TIME: 17.5 s — AB (ref 11.6–15.2)

## 2015-04-19 LAB — COMPREHENSIVE METABOLIC PANEL
ALBUMIN: 3.4 g/dL — AB (ref 3.5–5.0)
ALK PHOS: 132 U/L — AB (ref 38–126)
ALT: 34 U/L (ref 17–63)
ANION GAP: 12 (ref 5–15)
AST: 48 U/L — AB (ref 15–41)
BILIRUBIN TOTAL: 1.4 mg/dL — AB (ref 0.3–1.2)
BUN: 27 mg/dL — ABNORMAL HIGH (ref 6–20)
CALCIUM: 10.2 mg/dL (ref 8.9–10.3)
CO2: 26 mmol/L (ref 22–32)
Chloride: 113 mmol/L — ABNORMAL HIGH (ref 101–111)
Creatinine, Ser: 1.19 mg/dL (ref 0.61–1.24)
GFR calc Af Amer: >60 mL/min (ref >60)
GFR calc non Af Amer: 56 mL/min — ABNORMAL LOW (ref >60)
GLUCOSE: 184 mg/dL — AB (ref 65–99)
Potassium: 3.8 mmol/L (ref 3.5–5.1)
SODIUM: 151 mmol/L — AB (ref 135–145)
TOTAL PROTEIN: 8.8 g/dL — AB (ref 6.5–8.1)

## 2015-04-19 LAB — URINALYSIS, ROUTINE W REFLEX MICROSCOPIC
BILIRUBIN URINE: NEGATIVE
GLUCOSE, UA: NEGATIVE mg/dL
KETONES UR: NEGATIVE mg/dL
Leukocytes, UA: NEGATIVE
NITRITE: NEGATIVE
PH: 7 (ref 5.0–8.0)
Protein, ur: NEGATIVE mg/dL
Specific Gravity, Urine: 1.006 (ref 1.005–1.030)
Urobilinogen, UA: 1 mg/dL (ref 0.0–1.0)

## 2015-04-19 LAB — PROCALCITONIN: PROCALCITONIN: 0.1 ng/mL

## 2015-04-19 LAB — I-STAT CG4 LACTIC ACID, ED
LACTIC ACID, VENOUS: 2.38 mmol/L — AB (ref 0.5–2.0)
Lactic Acid, Venous: 3.08 mmol/L (ref 0.5–2.0)

## 2015-04-19 LAB — GLUCOSE, CAPILLARY: Glucose-Capillary: 106 mg/dL — ABNORMAL HIGH (ref 65–99)

## 2015-04-19 LAB — CBG MONITORING, ED: Glucose-Capillary: 184 mg/dL — ABNORMAL HIGH (ref 65–99)

## 2015-04-19 LAB — URINE MICROSCOPIC-ADD ON

## 2015-04-19 LAB — LACTIC ACID, PLASMA
LACTIC ACID, VENOUS: 1.6 mmol/L (ref 0.5–2.0)
Lactic Acid, Venous: 2.5 mmol/L (ref 0.5–2.0)

## 2015-04-19 LAB — APTT: APTT: 35 s (ref 24–37)

## 2015-04-19 MED ORDER — ENOXAPARIN SODIUM 40 MG/0.4ML ~~LOC~~ SOLN
40.0000 mg | SUBCUTANEOUS | Status: DC
  Administered 2015-04-19 – 2015-04-25 (×7): 40 mg via SUBCUTANEOUS
  Filled 2015-04-19 (×8): qty 0.4

## 2015-04-19 MED ORDER — VANCOMYCIN HCL IN DEXTROSE 1-5 GM/200ML-% IV SOLN
1000.0000 mg | Freq: Once | INTRAVENOUS | Status: AC
  Administered 2015-04-19: 1000 mg via INTRAVENOUS
  Filled 2015-04-19: qty 200

## 2015-04-19 MED ORDER — SODIUM CHLORIDE 0.9 % IV BOLUS (SEPSIS)
1000.0000 mL | Freq: Once | INTRAVENOUS | Status: AC
  Administered 2015-04-19: 1000 mL via INTRAVENOUS

## 2015-04-19 MED ORDER — LORAZEPAM 2 MG/ML IJ SOLN
1.0000 mg | Freq: Once | INTRAMUSCULAR | Status: AC
  Administered 2015-04-19: 1 mg via INTRAVENOUS

## 2015-04-19 MED ORDER — SODIUM CHLORIDE 0.9 % IV SOLN
INTRAVENOUS | Status: AC
  Administered 2015-04-19: 17:00:00 via INTRAVENOUS

## 2015-04-19 MED ORDER — HYDROCERIN EX CREA
TOPICAL_CREAM | Freq: Two times a day (BID) | CUTANEOUS | Status: DC | PRN
  Filled 2015-04-19: qty 113

## 2015-04-19 MED ORDER — PIPERACILLIN-TAZOBACTAM 3.375 G IVPB
3.3750 g | Freq: Three times a day (TID) | INTRAVENOUS | Status: DC
  Administered 2015-04-19 – 2015-04-25 (×17): 3.375 g via INTRAVENOUS
  Filled 2015-04-19 (×20): qty 50

## 2015-04-19 MED ORDER — TRAMADOL HCL 50 MG PO TABS
50.0000 mg | ORAL_TABLET | Freq: Two times a day (BID) | ORAL | Status: DC | PRN
  Administered 2015-04-21: 50 mg via ORAL
  Filled 2015-04-19: qty 1

## 2015-04-19 MED ORDER — SODIUM CHLORIDE 0.9 % IV BOLUS (SEPSIS)
500.0000 mL | INTRAVENOUS | Status: AC
  Administered 2015-04-19: 500 mL via INTRAVENOUS

## 2015-04-19 MED ORDER — SODIUM CHLORIDE 0.9 % IV SOLN
INTRAVENOUS | Status: DC
  Administered 2015-04-19: 1000 mL via INTRAVENOUS

## 2015-04-19 MED ORDER — ATORVASTATIN CALCIUM 20 MG PO TABS
20.0000 mg | ORAL_TABLET | Freq: Every day | ORAL | Status: DC
  Administered 2015-04-19 – 2015-04-26 (×6): 20 mg via ORAL
  Filled 2015-04-19 (×3): qty 1
  Filled 2015-04-19: qty 2
  Filled 2015-04-19 (×3): qty 1
  Filled 2015-04-19: qty 2
  Filled 2015-04-19: qty 1

## 2015-04-19 MED ORDER — LORAZEPAM 2 MG/ML IJ SOLN
INTRAMUSCULAR | Status: AC
  Filled 2015-04-19: qty 1

## 2015-04-19 MED ORDER — VANCOMYCIN HCL 500 MG IV SOLR
500.0000 mg | Freq: Two times a day (BID) | INTRAVENOUS | Status: DC
  Administered 2015-04-20 – 2015-04-21 (×3): 500 mg via INTRAVENOUS
  Filled 2015-04-19 (×6): qty 500

## 2015-04-19 MED ORDER — PIPERACILLIN-TAZOBACTAM 3.375 G IVPB 30 MIN
3.3750 g | Freq: Once | INTRAVENOUS | Status: AC
  Administered 2015-04-19: 3.375 g via INTRAVENOUS
  Filled 2015-04-19: qty 50

## 2015-04-19 MED ORDER — METOPROLOL TARTRATE 12.5 MG HALF TABLET
12.5000 mg | ORAL_TABLET | Freq: Two times a day (BID) | ORAL | Status: DC
  Administered 2015-04-19: 12.5 mg via ORAL
  Filled 2015-04-19: qty 1

## 2015-04-19 MED ORDER — MEMANTINE HCL 10 MG PO TABS
10.0000 mg | ORAL_TABLET | Freq: Two times a day (BID) | ORAL | Status: DC
  Administered 2015-04-19 – 2015-04-26 (×12): 10 mg via ORAL
  Filled 2015-04-19 (×3): qty 1
  Filled 2015-04-19: qty 2
  Filled 2015-04-19 (×7): qty 1
  Filled 2015-04-19: qty 2
  Filled 2015-04-19 (×3): qty 1

## 2015-04-19 MED ORDER — FLUOCINONIDE-E 0.05 % EX CREA
1.0000 "application " | TOPICAL_CREAM | Freq: Three times a day (TID) | CUTANEOUS | Status: DC
  Administered 2015-04-19 – 2015-04-26 (×15): 1 via TOPICAL
  Filled 2015-04-19 (×2): qty 15

## 2015-04-19 MED ORDER — TRIAMCINOLONE ACETONIDE 0.1 % EX CREA
1.0000 "application " | TOPICAL_CREAM | Freq: Two times a day (BID) | CUTANEOUS | Status: DC | PRN
  Filled 2015-04-19: qty 15

## 2015-04-19 MED ORDER — QUETIAPINE FUMARATE 25 MG PO TABS
25.0000 mg | ORAL_TABLET | Freq: Every day | ORAL | Status: DC
  Administered 2015-04-19 – 2015-04-21 (×3): 25 mg via ORAL
  Filled 2015-04-19 (×5): qty 1

## 2015-04-19 MED ORDER — SODIUM CHLORIDE 0.9 % IV BOLUS (SEPSIS)
1000.0000 mL | INTRAVENOUS | Status: AC
  Administered 2015-04-19: 1000 mL via INTRAVENOUS

## 2015-04-19 MED ORDER — HYDRALAZINE HCL 20 MG/ML IJ SOLN
5.0000 mg | Freq: Once | INTRAMUSCULAR | Status: AC
  Administered 2015-04-19: 5 mg via INTRAVENOUS
  Filled 2015-04-19: qty 1

## 2015-04-19 MED ORDER — CETAPHIL EX CREA: 1.0000 "application " | TOPICAL_CREAM | Freq: Two times a day (BID) | CUTANEOUS | Status: DC | PRN

## 2015-04-19 MED ORDER — INSULIN ASPART 100 UNIT/ML ~~LOC~~ SOLN
0.0000 [IU] | Freq: Three times a day (TID) | SUBCUTANEOUS | Status: DC
  Administered 2015-04-20: 3 [IU] via SUBCUTANEOUS
  Administered 2015-04-20 (×2): 2 [IU] via SUBCUTANEOUS
  Administered 2015-04-21: 5 [IU] via SUBCUTANEOUS
  Administered 2015-04-21: 3 [IU] via SUBCUTANEOUS
  Administered 2015-04-22 (×2): 2 [IU] via SUBCUTANEOUS
  Administered 2015-04-22 – 2015-04-23 (×2): 3 [IU] via SUBCUTANEOUS
  Administered 2015-04-23 – 2015-04-24 (×2): 2 [IU] via SUBCUTANEOUS
  Administered 2015-04-25: 3 [IU] via SUBCUTANEOUS
  Administered 2015-04-25 – 2015-04-26 (×4): 2 [IU] via SUBCUTANEOUS

## 2015-04-19 MED ORDER — SODIUM CHLORIDE 0.9 % IJ SOLN
3.0000 mL | Freq: Two times a day (BID) | INTRAMUSCULAR | Status: DC
  Administered 2015-04-22 – 2015-04-26 (×6): 3 mL via INTRAVENOUS

## 2015-04-19 NOTE — Progress Notes (Signed)
ANTIBIOTIC CONSULT NOTE - INITIAL  Pharmacy Consult for Vancomycin / Zosyn Indication: Sepsis  Allergies  Allergen Reactions  . Asa [Aspirin] Other (See Comments)    Per daughter, pt is not allergic to ASA, but chart has this listed as an allergy.    Patient Measurements:   Adjusted Body Weight:   Vital Signs: Temp: 98.9 F (37.2 C) (11/02 1420) Temp Source: Oral (11/02 1420) BP: 164/89 mmHg (11/02 1420) Pulse Rate: 114 (11/02 1420) Intake/Output from previous day:   Intake/Output from this shift:    Labs:  Recent Labs  04/19/15 1445  WBC 18.7*  HGB 15.8  PLT 409*  CREATININE 1.19   CrCl cannot be calculated (Unknown ideal weight.). No results for input(s): VANCOTROUGH, VANCOPEAK, VANCORANDOM, GENTTROUGH, GENTPEAK, GENTRANDOM, TOBRATROUGH, TOBRAPEAK, TOBRARND, AMIKACINPEAK, AMIKACINTROU, AMIKACIN in the last 72 hours.   Microbiology: No results found for this or any previous visit (from the past 720 hour(s)).  Medical History: Past Medical History  Diagnosis Date  . Diabetes mellitus without complication (HCC)   . Hypertension   . Alzheimer disease   . CAD (coronary artery disease)   . Stroke [facility](HCC)    Assessment: [age] yoM with hx advanced alzheimers (non-verbal), DM, CAD s/p CABG, stroke, and HTN with [facility]2 with worsening right foot wound x 3 weeks.  Pt followed by wound center.  In ED, pt found to have elevated lactate, leukocytosis, and tachycardia.  Pharmacy consulted to start Vancomycin and Zosyn for sepsis. First doses ordered in ED.   Anti-infectives 11/2 >> Vancomycin  >> 11/2 >> Zosyn  >>    Vitals/Labs WBC:  18.7 Tm24h: 98.9 Renal: SCr 1.19, CrCl 47 ml/min, 50N  Cultures 11/2 bloodx2: IP  Goal of Therapy:  Vancomycin trough level 15-20 mcg/ml  Eradication of infection  Plan:  Zosyn 3.375g IV q8h (infuse over 4 hours) Vancomycin 1g x1 in ED, then 500mg  IV q12h F/u renal function,  cultures, clinical course  Haynes Hoehnolleen Hailee Hollick, PharmD, BCPS 04/19/2015, 4:05 PM  Pager: [phone]

## 2015-04-19 NOTE — ED Notes (Signed)
Bed: [id number] Expected date:  Expected time:  Means of arrival:  Comments: Ems sepsis

## 2015-04-19 NOTE — ED Notes (Signed)
Per PTAR, [facility] c/o increasing R foot wound x 3 weeks and tachycardia starting today.  [facility] and recently seen by vascular.  Facility reported that the vascular surgeon stated that the wound is not healing correctly.  Blood work x 2 days ago WBC 17.9.  Hx of DM and dementia.

## 2015-04-19 NOTE — Consult Note (Signed)
Vascular Surgery Consultation  Reason for Consult: On healing ulcers right foot  HPI: Jonathan Richards is a 81 y.o. male who presents for evaluation of nonhealing ulcers right foot. This Vietman[place] patient does not speak [place]. He is accompanied by his son and daughter-in-law. Apparently he has had a nonhealing ulcer on the dorsum of the right foot and on the heel for 3-6 months. He has gone periodically to the wound center. The ulcer has continued to worsen. He has not been seen by vascular surgeon. He had ABIs performed on 04/12/2015. ABI on the right was 0.42 and on the left was 0.86. Patient has had previous coronary artery bypass grafting in 2002. Saphenous vein was harvested from the right leg. Patient has been nonambulatory for at least 4 months. 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 Asian also carries a diagnosis of Alzheimer's disease, hypertension, diabetes mellitus.   Past Medical History  Diagnosis Date  . Diabetes mellitus without complication (HCC)   . Hypertension   . Alzheimer disease   . CAD (coronary artery disease)   . Stroke [facility](HCC)    Past Surgical History  Procedure Laterality Date  . Coronary artery bypass graft     Social History   Social History  . Marital Status: Married    Spouse Name: N/A  . Number of Children: N/A  . Years of Education: N/A   Social History Main Topics  . Smoking status: Never Smoker   . Smokeless tobacco: None  . Alcohol Use: No  . Drug Use: No  . Sexual Activity: Not Currently   Other Topics Concern  . None   Social History Narrative   Family History  Problem Relation Age of Onset  . Heart attack Son    Allergies  Allergen Reactions  . Asa [Aspirin] Other (See Comments)    Per daughter, pt is not allergic to ASA, but chart has this listed as an allergy.   Prior to Admission medications   Medication Sig Start Date End Date Taking? Authorizing Provider   amLODipine (NORVASC) 5 MG tablet Take 5 mg by mouth every morning.   Yes Historical Provider, MD  atorvastatin (LIPITOR) 20 MG tablet Take 20 mg by mouth daily.   Yes Historical Provider, MD  cetaphil (CETAPHIL) cream Apply 1 application topically 2 (two) times daily as needed (for dry skin areas).    Yes Historical Provider, MD  cholecalciferol (VITAMIN D) 400 UNITS TABS tablet Take 800 Units by mouth daily.   Yes Historical Provider, MD  dexlansoprazole (DEXILANT) 60 MG capsule Take 60 mg by mouth every morning.   Yes Historical Provider, MD  fluocinonide-emollient (LIDEX-E) 0.05 % cream Apply 1 application topically 3 (three) times daily. 11/25/13  Yes Elvina SidleKurt Lauenstein, MD  losartan (COZAAR) 50 MG tablet Take 1 tablet (50 mg total) by mouth daily. At 8 am Patient taking differently: Take 50 mg by mouth daily.  02/10/14  Yes Elvina SidleKurt Lauenstein, MD  memantine (NAMENDA) 10 MG tablet Take 10 mg by mouth 2 (two) times daily.    Yes Historical Provider, MD  metFORMIN (GLUCOPHAGE) 500 MG tablet Take 500 mg by mouth at bedtime.   Yes Historical Provider, MD  QUEtiapine (SEROQUEL) 25 MG tablet Take 25 mg by mouth at bedtime.   Yes Historical Provider, MD  traMADol (ULTRAM) 50 MG tablet Take 50 mg by mouth every 12 (twelve) hours as needed for moderate pain.  Yes Historical Provider, MD  triamcinolone cream (KENALOG) 0.1 % Apply 1 application topically every 12 (twelve) hours as needed (for dry skin areas).   Yes Historical Provider, MD  Vitamins A & D (VITAMIN A & D) ointment Apply 1 application topically 3 (three) times daily.   Yes Historical Provider, MD  clindamycin (CLEOCIN) 150 MG capsule Take 1 capsule (150 mg total) by mouth 4 (four) times daily. Patient not taking: Reported on 03/14/2015 01/18/15   Courteney Lyn Mackuen, MD     Positive ROS:   All other systems have been reviewed and were otherwise negative with the exception of those mentioned in the HPI and as above.  Physical Exam: Filed  Vitals:   04/19/15 1730  BP: 139/77  Pulse: 73  Temp:   Resp: 17    General: Alert, no acute distress HEENT: Normal for age-questions answered by son  Cardiovascular: Regular rate and rhythm. Carotid pulses 2+, no bruits audible Respiratory: Clear to auscultation. No cyanosis, no use of accessory musculature GI: No organomegaly, abdomen is soft and non-tender-scaphoid Skin: No lesions in the area of chief complaint Neurologic: Patient does not follow commands because of language barrier. Seems to have very weak grip and strength and right side. Psychiatric: Patient is competent for consent with normal mood and affect Musculoskeletal: No obvious deformities Extremities: Left leg with 3+ femoral 2+ popliteal pulse palpable. Brisk Doppler flow in left posterior tibial which is monophasic. Right leg with 2+ femoral pulse palpable. Absent popliteal and distal pulses. No audible flow easily heard in the right foot.  Extensive ulceration on dorsal right foot 3 x 4 cm in diameter with exposed extensor tendons and erythema and surrounding skin. Also 2 x 1 cm pressure sore right heel with no exposed bone.    Imaging reviewed: ABIs reviewed which were 0.42 in the right posterior tibial and 0.86 in the left posterior tibial   Assessment/Plan:  Chronic nonhealing ulcers right foot secondary to probable superficial femoral and tibial occlusive disease with diabetes mellitus Nonambulatory patient who has had previous CVA and has chronic right hemiparesis Coronary artery disease status post coronary artery bypass grafting in 2002  Ulceration and right foot is not a salvageable situation. Patient has extensive vascular disease. Patient needs right above-knee amputation for pain control. Discussed this with patient's son and daughter-in-law and they understand and agree with the plan  Options include orthopedic surgical consult with right AKA performed [facility] versus transfer patient to  hospitalist service at [facility]. If that is performed please reconsult our service and we will try to arrange right AKA when schedule permits. This will likely be next week. Scearce this with family and they understand.   Josephina Gip, MD 04/19/2015 5:39 PM

## 2015-04-19 NOTE — Progress Notes (Signed)
Pt arrived in room 1229 at 1845.  No signs of distress.  Pt is confused to person, place, time and does not appear to speak [place].  VSS.  Tele applied.  Rhythm is ST.  Erick Blinksuchman, Bacilio Abascal D, RN

## 2015-04-19 NOTE — H&P (Addendum)
History and Physical  Su HoffDai V Dozal XBJ:[id number] DOB: 11/18/1933 DOA: 04/19/2015  Referring physician: EDP PCP: Florentina JennyRIPP, HENRY, MD   Chief Complaint: tachycardia, nonhealing right foot wound  HPI: Su HoffDai V Florek is a 81 y.o. male  Who has h/o dementia, HTN, stroke, noninsulin dependent diabetes, [facility] living facility due to tachycardia, patient has been treated for nonhealing right foot wound at wound care center, and he is referred to vascular surgery due to nonhealing wound, but has not been evaluated by vascular surgery yet. He has home health nurse coming in to visit him three time a week, today he was found to be tachycardia in to the 120's, [facility]. In the ED, his heart rate initial on presentation in the 120's, bp stable, initial labs showed leukocytosis of 18.7 , lactic acidosis 3.08, hypernatremia, na 151 and mild azotemia, family reported patient has poor oral intake recently. Patient has dementia, family speak some english. History is limited.  Blood culture sent from the ED, sepsis protocol initiated, patient was given vanc/zosyn and ivf. Vascular surgery consulted by EDP, hospitalist called to admit the patient.   Review of Systems:  Detail per HPI, Review of systems are otherwise negative  Past Medical History  Diagnosis Date  . Diabetes mellitus without complication (HCC)   . Hypertension   . Alzheimer disease   . CAD (coronary artery disease)   . Stroke [facility](HCC)    Past Surgical History  Procedure Laterality Date  . Coronary artery bypass graft     Social History:  reports that he has never smoked. He does not have any smokeless tobacco history on file. He reports that he does not drink alcohol or use illicit drugs. Patient lives at assisted living & has advanced dementia, need max assist in activities of daily living. Will likely benefit SNF.  Allergies  Allergen Reactions  . Asa [Aspirin] Other (See Comments)    Per daughter, pt is not  allergic to ASA, but chart has this listed as an allergy.    Family History  Problem Relation Age of Onset  . Heart attack Son       Prior to Admission medications   Medication Sig Start Date End Date Taking? Authorizing Provider  amLODipine (NORVASC) 5 MG tablet Take 5 mg by mouth every morning.   Yes Historical Provider, MD  atorvastatin (LIPITOR) 20 MG tablet Take 20 mg by mouth daily.   Yes Historical Provider, MD  cetaphil (CETAPHIL) cream Apply 1 application topically 2 (two) times daily as needed (for dry skin areas).    Yes Historical Provider, MD  cholecalciferol (VITAMIN D) 400 UNITS TABS tablet Take 800 Units by mouth daily.   Yes Historical Provider, MD  dexlansoprazole (DEXILANT) 60 MG capsule Take 60 mg by mouth every morning.   Yes Historical Provider, MD  fluocinonide-emollient (LIDEX-E) 0.05 % cream Apply 1 application topically 3 (three) times daily. 11/25/13  Yes Elvina SidleKurt Lauenstein, MD  losartan (COZAAR) 50 MG tablet Take 1 tablet (50 mg total) by mouth daily. At 8 am Patient taking differently: Take 50 mg by mouth daily.  02/10/14  Yes Elvina SidleKurt Lauenstein, MD  memantine (NAMENDA) 10 MG tablet Take 10 mg by mouth 2 (two) times daily.    Yes Historical Provider, MD  metFORMIN (GLUCOPHAGE) 500 MG tablet Take 500 mg by mouth at bedtime.   Yes Historical Provider, MD  QUEtiapine (SEROQUEL) 25 MG tablet Take 25 mg by mouth at bedtime.  Yes Historical Provider, MD  traMADol (ULTRAM) 50 MG tablet Take 50 mg by mouth every 12 (twelve) hours as needed for moderate pain.    Yes Historical Provider, MD  triamcinolone cream (KENALOG) 0.1 % Apply 1 application topically every 12 (twelve) hours as needed (for dry skin areas).   Yes Historical Provider, MD  Vitamins A & D (VITAMIN A & D) ointment Apply 1 application topically 3 (three) times daily.   Yes Historical Provider, MD  clindamycin (CLEOCIN) 150 MG capsule Take 1 capsule (150 mg total) by mouth 4 (four) times daily. Patient not taking:  Reported on 03/14/2015 01/18/15   Courteney Lyn Mackuen, MD    Physical Exam: BP 155/130 mmHg  Pulse 116  Temp(Src) 98.9 F (37.2 C) (Oral)  Resp 23  Ht  (1.702 m)  Wt 150 lb (68.04 kg)  BMI 23.49 kg/m2  SpO2 98%  General:  Demented, not following commend, does not look comfortable, cachectic, family  in room Eyes: PERRL ENT: dry oral mucosa,  Neck: supple, no JVD Cardiovascular: sinus tachycardia Respiratory: CTABL Abdomen: soft/ND/ND, positive bowel sounds Skin: no rash Musculoskeletal:  Right foot nonhealing wound Psychiatric: confused Neurologic: not oriented, right sided hemiparesis from prior cva          Labs on Admission:  Basic Metabolic Panel:  Recent Labs Lab 04/19/15 1445  NA 151*  K 3.8  CL 113*  CO2 26  GLUCOSE 184*  BUN 27*  CREATININE 1.19  CALCIUM 10.2   Liver Function Tests:  Recent Labs Lab 04/19/15 1445  AST 48*  ALT 34  ALKPHOS 132*  BILITOT 1.4*  PROT 8.8*  ALBUMIN 3.4*   No results for input(s): LIPASE, AMYLASE in the last 168 hours. No results for input(s): AMMONIA in the last 168 hours. CBC:  Recent Labs Lab 04/19/15 1445  WBC 18.7*  NEUTROABS 13.3*  HGB 15.8  HCT 48.7  MCV 93.5  PLT 409*   Cardiac Enzymes: No results for input(s): CKTOTAL, CKMB, CKMBINDEX, TROPONINI in the last 168 hours.  BNP (last 3 results) No results for input(s): BNP in the last 8760 hours.  ProBNP (last 3 results) No results for input(s): PROBNP in the last 8760 hours.  CBG:  Recent Labs Lab 04/19/15 1430  GLUCAP 184*    Radiological Exams on Admission: Dg Chest 1 View  04/19/2015  CLINICAL DATA:  Tachycardia. Diabetes and hypertension. Nonhealing right foot wound. Coronary artery disease. EXAM: CHEST 1 VIEW COMPARISON:  06/24/2013 FINDINGS: Heart size remains normal. Tortuosity of thoracic aorta is stable. Prior CABG again noted. Both lungs are clear. No evidence of pneumothorax or pleural effusion. IMPRESSION: No active  disease. Electronically Signed   By: Myles Rosenthal M.D.   On: 04/19/2015 15:51   Dg Foot 2 Views Right  04/19/2015  CLINICAL DATA:  81 year old male with right-sided foot pain. Nonhealing right foot wound. History of diabetes and hypertension. EXAM: RIGHT FOOT - 2 VIEW COMPARISON:  Right foot radiograph 03/31/2015. FINDINGS: No aggressive appearing areas of osteolytic cysts are noted. No acute displaced fracture, subluxation or dislocation. Multifocal joint space narrowing, subchondral sclerosis, subchondral cyst formation and osteophyte formation, compatible with osteoarthritis, most severe in the first MTP and first interphalangeal joints. IMPRESSION: 1. No acute radiographic abnormality of the right foot. Chronic findings, as above Electronically Signed   By: Trudie Reed M.D.   On: 04/19/2015 15:52    EKG: Independently reviewed. Sinus tachycardia, no acute ST/T changes,   Assessment/Plan Present on Admission:  .  Sepsis (HCC)  Sepsis: with leukocytosis/lactic acidosis/sinus tachycardia presented on admission, source of infection likely right foot, cxr no acute findings, UA pending collection, blood culture pending, ivf/abx, admit to stepdown.  Nonhealing wound right foot: vascular surgery consulted, on abx /prn pain meds for now.  HTN, home meds norvasc/losartan held, start lopressor in the setting of sinus tachycardia.  HLD: continue statin  Dementia/FTT: continue namenda, seroquel, will need PT/OT and likely  SNF placement.   DVT prophylaxis: lovenox  Consultants: vascular surgery  Code Status: full   Family Communication:  Patient and family in room  Disposition Plan: admit to stepdown  Time spent:  Larayne Baxley MD, PhD [facility] Pager [phone] If 7PM-7AM, please contact night-coverage at [email or web address], password [facility]

## 2015-04-19 NOTE — ED Notes (Signed)
Family reported to EDP that the Pt has not eaten in a week and that he has home health x 3 days a week.

## 2015-04-19 NOTE — ED Provider Notes (Addendum)
CSN: [id number]     Arrival date & time 04/19/15  1413 History   First MD Initiated Contact with Patient 04/19/15 1505     Chief Complaint  Patient presents with  . Foot Wound   . Tachycardia     (Consider location/radiation/quality/duration/timing/severity/associated sxs/prior Treatment) The history is provided by the EMS personnel and a relative.   level V caveat applies due to patient's dementia.  Patient brought in by P[facility] from assisted living. Patient has a nurse that visits 3 times a week. They noted the patient was significantly tachycardic with heart rate around 125. Patient known to have a nonhealing right foot wound that's been ongoing for 3 weeks. Seen by wound care clinic and recently referred to vascular surgery for evaluation. Patient has a history of significant dementia.  Past Medical History  Diagnosis Date  . Diabetes mellitus without complication (HCC)   . Hypertension   . Alzheimer disease   . CAD (coronary artery disease)   . Stroke [facility])    Past Surgical History  Procedure Laterality Date  . Coronary artery bypass graft     Family History  Problem Relation Age of Onset  . Heart attack Son    Social History  Substance Use Topics  . Smoking status: Never Smoker   . Smokeless tobacco: None  . Alcohol Use: No    Review of Systems  Reason unable to perform ROS: Level V caveat applies due to patient's dementia.      Allergies  Asa  Home Medications   Prior to Admission medications   Medication Sig Start Date End Date Taking? Authorizing Provider  amLODipine (NORVASC) 5 MG tablet Take 5 mg by mouth every morning.   Yes Historical Provider, MD  atorvastatin (LIPITOR) 20 MG tablet Take 20 mg by mouth daily.   Yes Historical Provider, MD  cetaphil (CETAPHIL) cream Apply 1 application topically 2 (two) times daily as needed (for dry skin areas).    Yes Historical Provider, MD  cholecalciferol (VITAMIN D) 400 UNITS TABS tablet Take 800 Units by mouth  daily.   Yes Historical Provider, MD  dexlansoprazole (DEXILANT) 60 MG capsule Take 60 mg by mouth every morning.   Yes Historical Provider, MD  fluocinonide-emollient (LIDEX-E) 0.05 % cream Apply 1 application topically 3 (three) times daily. 11/25/13  Yes Elvina Sidle, MD  losartan (COZAAR) 50 MG tablet Take 1 tablet (50 mg total) by mouth daily. At 8 am Patient taking differently: Take 50 mg by mouth daily.  02/10/14  Yes Elvina Sidle, MD  memantine (NAMENDA) 10 MG tablet Take 10 mg by mouth 2 (two) times daily.    Yes Historical Provider, MD  metFORMIN (GLUCOPHAGE) 500 MG tablet Take 500 mg by mouth at bedtime.   Yes Historical Provider, MD  QUEtiapine (SEROQUEL) 25 MG tablet Take 25 mg by mouth at bedtime.   Yes Historical Provider, MD  traMADol (ULTRAM) 50 MG tablet Take 50 mg by mouth every 12 (twelve) hours as needed for moderate pain.    Yes Historical Provider, MD  triamcinolone cream (KENALOG) 0.1 % Apply 1 application topically every 12 (twelve) hours as needed (for dry skin areas).   Yes Historical Provider, MD  Vitamins A & D (VITAMIN A & D) ointment Apply 1 application topically 3 (three) times daily.   Yes Historical Provider, MD  clindamycin (CLEOCIN) 150 MG capsule Take 1 capsule (150 mg total) by mouth 4 (four) times daily. Patient not taking: Reported on 03/14/2015 01/18/15   Courteney  Lyn Mackuen, MD   BP 153/97 mmHg  Pulse 120  Temp(Src) 98.9 F (37.2 C) (Oral)  Resp 26  Ht  (1.702 m)  Wt 150 lb (68.04 kg)  BMI 23.49 kg/m2  SpO2 100% Physical Exam  Constitutional:  Thin. Alert moving all 4 extremities.  HENT:  Head: Normocephalic and atraumatic.  Mucous membranes dry.  Eyes: Conjunctivae are normal. Pupils are equal, round, and reactive to light.  Neck: Normal range of motion. Neck supple.  Cardiovascular: Regular rhythm and normal heart sounds.   Tachycardic  Pulmonary/Chest: Effort normal and breath sounds normal. No respiratory distress.  Abdominal:  Soft. Bowel sounds are normal. He exhibits no distension. There is no tenderness.  Musculoskeletal: Normal range of motion.  Right foot with 2 nonhealing ulcers. One is on the heel but does not appear very deep no purulent discharge. No crepitance surrounding it. The other is on the anterior part of the right foot measuring about 3 x 4 cm that has a extensor tendon exposure. Purulent discharge no crepitance. Refill is delayed to the toes.  Neurological: He is alert. No cranial nerve deficit.  Moving all 4 extremities.  Skin: Skin is dry.  Nursing note and vitals reviewed.   ED Course  Procedures (including critical care time) Labs Review Labs Reviewed  COMPREHENSIVE METABOLIC PANEL - Abnormal; Notable for the following:    Sodium 151 (*)    Chloride 113 (*)    Glucose, Bld 184 (*)    BUN 27 (*)    Total Protein 8.8 (*)    Albumin 3.4 (*)    AST 48 (*)    Alkaline Phosphatase 132 (*)    Total Bilirubin 1.4 (*)    GFR calc non Af Amer 56 (*)    All other components within normal limits  CBC WITH DIFFERENTIAL/PLATELET - Abnormal; Notable for the following:    WBC 18.7 (*)    Platelets 409 (*)    Neutro Abs 13.3 (*)    Monocytes Absolute 1.5 (*)    All other components within normal limits  I-STAT CG4 LACTIC ACID, ED - Abnormal; Notable for the following:    Lactic Acid, Venous 3.08 (*)    All other components within normal limits  CBG MONITORING, ED - Abnormal; Notable for the following:    Glucose-Capillary 184 (*)    All other components within normal limits  CULTURE, BLOOD (ROUTINE X 2)  CULTURE, BLOOD (ROUTINE X 2)  URINE CULTURE  URINALYSIS, ROUTINE W REFLEX MICROSCOPIC (NOT AT [facility])   Results for orders placed or performed during the hospital encounter of 04/19/15  Comprehensive metabolic panel  Result Value Ref Range   Sodium 151 (H) 135 - 145 mmol/L   Potassium 3.8 3.5 - 5.1 mmol/L   Chloride 113 (H) 101 - 111 mmol/L   CO2 26 22 - 32 mmol/L   Glucose, Bld 184 (H)  65 - 99 mg/dL   BUN 27 (H) 6 - 20 mg/dL   Creatinine, Ser 1.61 0.61 - 1.24 mg/dL   Calcium 09.6 8.9 - 04.5 mg/dL   Total Protein 8.8 (H) 6.5 - 8.1 g/dL   Albumin 3.4 (L) 3.5 - 5.0 g/dL   AST 48 (H) 15 - 41 U/L   ALT 34 17 - 63 U/L   Alkaline Phosphatase 132 (H) 38 - 126 U/L   Total Bilirubin 1.4 (H) 0.3 - 1.2 mg/dL   GFR calc non Af Amer 56 (L) >60 mL/min   GFR calc Af  Amer >60 >60 mL/min   Anion gap 12 5 - 15  CBC with Differential  Result Value Ref Range   WBC 18.7 (H) 4.0 - 10.5 K/uL   RBC 5.21 4.22 - 5.81 MIL/uL   Hemoglobin 15.8 13.0 - 17.0 g/dL   HCT 16.1 09.6 - 04.5 %   MCV 93.5 78.0 - 100.0 fL   MCH 30.3 26.0 - 34.0 pg   MCHC 32.4 30.0 - 36.0 g/dL   RDW 40.9 81.1 - 91.4 %   Platelets 409 (H) 150 - 400 K/uL   Neutrophils Relative % 71 %   Neutro Abs 13.3 (H) 1.7 - 7.7 K/uL   Lymphocytes Relative 21 %   Lymphs Abs 3.8 0.7 - 4.0 K/uL   Monocytes Relative 8 %   Monocytes Absolute 1.5 (H) 0.1 - 1.0 K/uL   Eosinophils Relative 0 %   Eosinophils Absolute 0.0 0.0 - 0.7 K/uL   Basophils Relative 0 %   Basophils Absolute 0.0 0.0 - 0.1 K/uL  I-Stat CG4 Lactic Acid, ED  (not at [facility])  Result Value Ref Range   Lactic Acid, Venous 3.08 (HH) 0.5 - 2.0 mmol/L   Comment NOTIFIED PHYSICIAN   CBG monitoring, ED  Result Value Ref Range   Glucose-Capillary 184 (H) 65 - 99 mg/dL     Imaging Review Dg Chest 1 View  04/19/2015  CLINICAL DATA:  Tachycardia. Diabetes and hypertension. Nonhealing right foot wound. Coronary artery disease. EXAM: CHEST 1 VIEW COMPARISON:  06/24/2013 FINDINGS: Heart size remains normal. Tortuosity of thoracic aorta is stable. Prior CABG again noted. Both lungs are clear. No evidence of pneumothorax or pleural effusion. IMPRESSION: No active disease. Electronically Signed   By: Myles Rosenthal M.D.   On: 04/19/2015 15:51   Dg Foot 2 Views Right  04/19/2015  CLINICAL DATA:  81 year old male with right-sided foot pain. Nonhealing right foot wound. History of  diabetes and hypertension. EXAM: RIGHT FOOT - 2 VIEW COMPARISON:  Right foot radiograph 03/31/2015. FINDINGS: No aggressive appearing areas of osteolytic cysts are noted. No acute displaced fracture, subluxation or dislocation. Multifocal joint space narrowing, subchondral sclerosis, subchondral cyst formation and osteophyte formation, compatible with osteoarthritis, most severe in the first MTP and first interphalangeal joints. IMPRESSION: 1. No acute radiographic abnormality of the right foot. Chronic findings, as above Electronically Signed   By: Trudie Reed M.D.   On: 04/19/2015 15:52   I have personally reviewed and evaluated these images and lab results as part of my medical decision-making.   EKG Interpretation   Date/Time:  Wednesday April 19 2015 15:50:59 EDT Ventricular Rate:  125 PR Interval:  108 QRS Duration: 71 QT Interval:  328 QTC Calculation: 473 R Axis:   58 Text Interpretation:  Sinus tachycardia Probable left atrial enlargement  Abnormal R-wave progression, early transition Repol abnrm suggests  ischemia, diffuse leads No significant change since last tracing Confirmed  by Nox Talent  MD, Brytney Somes [phone]) on 04/19/2015 3:58:49 PM       CRITICAL CARE Performed by: Vanetta Mulders Total critical care time: 30 minutes Critical care time was exclusive of separately billable procedures and treating other patients. Critical care was necessary to treat or prevent imminent or life-threatening deterioration. Critical care was time spent personally by me on the following activities: development of treatment plan with patient and/or surrogate as well as nursing, discussions with consultants, evaluation of patient's response to treatment, examination of patient, obtaining history from patient or surrogate, ordering and performing treatments and interventions, ordering and  review of laboratory studies, ordering and review of radiographic studies, pulse oximetry and re-evaluation  of patient's condition.     MDM   Final diagnoses:  Dementia, without behavioral disturbance  Hypernatremia  Wound infection (HCC)  Sepsis, due to unspecified organism [facility](HCC)   The patient lives by himself at assisted living. Does have a nurse come out to check on him 3 times a week. Patient's had some chronic, nonhealing wounds to the right foot 1 to the heel and one to the top of the foot. Being followed by wound clinic for this. Does have an appointment coming up to be seen by vascular surgery. Patient has long-standing dementia and does not speak [place]. It appears from the state of the patient today and with talking with family the patient is really not capable of taking care of himself in assisted living. Family stating he's not eating or drinking.  Workup here today showed significant tachycardia with an elevated lactic acid the septic protocols were started based on this. No documented fever no documented hypotension. Of the right foot wound particularly the one on the anterior part of the foot has the extensor tendons exposed and purulent discharge. X-ray showed no evidence of any bony involvement. The patient started on the septic criteria antibiotics and order set to include the 1 L of normal saline of fluids. Patient's been started on the antibiotics. Chest x-ray negative for any evidence of infection. Urinalysis is still pending.  Electrolytes show significant elevation in the sodium of 151. Patient has a history of diabetes however blood sugar is reasonable today. Renal function is normal. Marked leukocytosis but he's had sort of a chronic elevated the white blood cell count.  Patient is given a require admission to day for the significant tachycardia with heart rates as high as 125 and possible sepsis with elevated lactic acid. In addition patient also has the elevated sodium of 151.  Patient's mental status and alertness according to family is baseline. Patient does not have any  special requests for resuscitation does not have a living will.   Patient be admitted to step down unit by internal medicine. Vascular surgery consult did take a look at the foot.    Vanetta MuldersScott Judiann Celia, MD 04/19/15 1658  Vanetta MuldersScott Darlyne Schmiesing, MD 04/19/15 [id number]

## 2015-04-20 ENCOUNTER — Inpatient Hospital Stay (HOSPITAL_COMMUNITY): Payer: Medicare Other

## 2015-04-20 DIAGNOSIS — E43 Unspecified severe protein-calorie malnutrition: Secondary | ICD-10-CM | POA: Insufficient documentation

## 2015-04-20 LAB — COMPREHENSIVE METABOLIC PANEL
ALT: 33 U/L (ref 17–63)
ANION GAP: 9 (ref 5–15)
AST: 46 U/L — ABNORMAL HIGH (ref 15–41)
Albumin: 2.9 g/dL — ABNORMAL LOW (ref 3.5–5.0)
Alkaline Phosphatase: 122 U/L (ref 38–126)
BUN: 19 mg/dL (ref 6–20)
CHLORIDE: 117 mmol/L — AB (ref 101–111)
CO2: 27 mmol/L (ref 22–32)
Calcium: 9.2 mg/dL (ref 8.9–10.3)
Creatinine, Ser: 1.03 mg/dL (ref 0.61–1.24)
GFR calc non Af Amer: >60 mL/min (ref >60)
Glucose, Bld: 133 mg/dL — ABNORMAL HIGH (ref 65–99)
Potassium: 2.9 mmol/L — ABNORMAL LOW (ref 3.5–5.1)
SODIUM: 153 mmol/L — AB (ref 135–145)
Total Bilirubin: 1.3 mg/dL — ABNORMAL HIGH (ref 0.3–1.2)
Total Protein: 7.6 g/dL (ref 6.5–8.1)

## 2015-04-20 LAB — GLUCOSE, CAPILLARY
GLUCOSE-CAPILLARY: 139 mg/dL — AB (ref 65–99)
GLUCOSE-CAPILLARY: 197 mg/dL — AB (ref 65–99)
Glucose-Capillary: 178 mg/dL — ABNORMAL HIGH (ref 65–99)
Glucose-Capillary: 150 mg/dL — ABNORMAL HIGH (ref 65–99)

## 2015-04-20 LAB — CBC
HCT: 44.9 % (ref 39.0–52.0)
HEMOGLOBIN: 14.4 g/dL (ref 13.0–17.0)
MCH: 30.1 pg (ref 26.0–34.0)
MCHC: 32.1 g/dL (ref 30.0–36.0)
MCV: 93.7 fL (ref 78.0–100.0)
Platelets: 384 10*3/uL (ref 150–400)
RBC: 4.79 MIL/uL (ref 4.22–5.81)
RDW: 14 % (ref 11.5–15.5)
WBC: 18.5 10*3/uL — ABNORMAL HIGH (ref 4.0–10.5)

## 2015-04-20 LAB — MAGNESIUM: MAGNESIUM: 1.8 mg/dL (ref 1.7–2.4)

## 2015-04-20 LAB — TSH: TSH: 0.976 u[IU]/mL (ref 0.350–4.500)

## 2015-04-20 LAB — MRSA PCR SCREENING: MRSA by PCR: POSITIVE — AB

## 2015-04-20 LAB — LIPID PANEL
CHOLESTEROL: 123 mg/dL (ref 0–200)
HDL: 36 mg/dL — ABNORMAL LOW (ref >40)
LDL Cholesterol: 64 mg/dL (ref 0–99)
TRIGLYCERIDES: 117 mg/dL (ref <150)
Total CHOL/HDL Ratio: 3.4 RATIO
VLDL: 23 mg/dL (ref 0–40)

## 2015-04-20 LAB — LACTIC ACID, PLASMA: Lactic Acid, Venous: 1.9 mmol/L (ref 0.5–2.0)

## 2015-04-20 MED ORDER — POTASSIUM CHLORIDE CRYS ER 20 MEQ PO TBCR
40.0000 meq | EXTENDED_RELEASE_TABLET | Freq: Three times a day (TID) | ORAL | Status: AC
  Administered 2015-04-20 (×3): 40 meq via ORAL
  Filled 2015-04-20 (×3): qty 2

## 2015-04-20 MED ORDER — HYDRALAZINE HCL 20 MG/ML IJ SOLN
5.0000 mg | Freq: Once | INTRAMUSCULAR | Status: AC
  Administered 2015-04-20: 5 mg via INTRAVENOUS

## 2015-04-20 MED ORDER — HYDRALAZINE HCL 20 MG/ML IJ SOLN
INTRAMUSCULAR | Status: AC
  Filled 2015-04-20: qty 1

## 2015-04-20 MED ORDER — DEXTROSE-NACL 5-0.9 % IV SOLN
INTRAVENOUS | Status: AC
  Administered 2015-04-20 – 2015-04-21 (×2): via INTRAVENOUS
  Filled 2015-04-20 (×4): qty 1000

## 2015-04-20 MED ORDER — COLLAGENASE 250 UNIT/GM EX OINT
TOPICAL_OINTMENT | Freq: Every day | CUTANEOUS | Status: DC
  Administered 2015-04-20 – 2015-04-25 (×5): via TOPICAL
  Administered 2015-04-26: 1 via TOPICAL
  Filled 2015-04-20 (×2): qty 30

## 2015-04-20 MED ORDER — SODIUM CHLORIDE 0.9 % IV BOLUS (SEPSIS)
500.0000 mL | Freq: Once | INTRAVENOUS | Status: AC
  Administered 2015-04-20: 500 mL via INTRAVENOUS

## 2015-04-20 MED ORDER — CHLORHEXIDINE GLUCONATE CLOTH 2 % EX PADS
6.0000 | MEDICATED_PAD | Freq: Every day | CUTANEOUS | Status: AC
  Administered 2015-04-20 – 2015-04-24 (×5): 6 via TOPICAL

## 2015-04-20 MED ORDER — METOPROLOL TARTRATE 25 MG PO TABS
25.0000 mg | ORAL_TABLET | Freq: Two times a day (BID) | ORAL | Status: DC
  Administered 2015-04-20 – 2015-04-26 (×11): 25 mg via ORAL
  Filled 2015-04-20 (×13): qty 1

## 2015-04-20 MED ORDER — MUPIROCIN 2 % EX OINT
1.0000 | TOPICAL_OINTMENT | Freq: Two times a day (BID) | CUTANEOUS | Status: AC
Start: 2015-04-20 — End: 2015-04-24
  Administered 2015-04-20 – 2015-04-24 (×10): 1 via NASAL
  Filled 2015-04-20 (×4): qty 22

## 2015-04-20 MED ORDER — GLUCERNA SHAKE PO LIQD
237.0000 mL | Freq: Three times a day (TID) | ORAL | Status: DC
  Administered 2015-04-20 – 2015-04-25 (×10): 237 mL via ORAL
  Filled 2015-04-20 (×2): qty 237

## 2015-04-20 MED ORDER — PRO-STAT SUGAR FREE PO LIQD
30.0000 mL | Freq: Two times a day (BID) | ORAL | Status: DC
  Administered 2015-04-20 – 2015-04-25 (×10): 30 mL via ORAL
  Filled 2015-04-20 (×13): qty 30

## 2015-04-20 NOTE — Progress Notes (Addendum)
[facility] PROGRESS NOTE  DEMONT LINFORD ZOX:[id number] DOB: Jun 27, 1933 DOA: 04/19/2015 PCP: Florentina Jenny, MD  Length of stay: 1   Assessment/Plan: Active Problems:   Sepsis (HCC)   Pressure ulcer    Sepsis: Secondary to nonhealing ulcer of the right foot, with leukocytosis/lactic acidosis/sinus tachycardia today,   cxr no acute findings, UA negative, MRSA PCR positive, blood culture no growth so far, continue vancomycin/Zosyn, continue stepdown until bed available, then transfer to telemetry at [facility]  Acute delirium could be secondary to #1 vs electrolyte abnormalities, hypernatremia, sodium 153  Hypernatremia/hypokalemia-adjusted IV fluids   Nonhealing wound right foot: vascular surgery consulted, on abx /prn pain meds for now.ABIs reviewed which were 0.42 in the right posterior tibial and 0.86 in the left posterior tibial. Discussed with Dr. Roda Shutters, he recommend transfer to [facility] for continued care by vascular surgery. NotifIed  Dr. Edilia Bo  Who is on call today that the patient will be transferred to [facility] today   HTN, home meds norvasc/losartan held, increase lopressor in the setting of persistent sinus tachycardia.  HLD: continue statin  Dementia/FTT: continue namenda, seroquel, will need PT/OT and likely SNF placement.  Diabetes mellitus, continue sliding scale insulin, hold metformin, check hemoglobin A1c    DVT prophylaxsis Lovenox   Code Status:      Code Status Orders        Start     Ordered   04/19/15 1904  Full code   Continuous     04/19/15 1903     Family Communication: family updated about patient's clinical progress Disposition Plan:  As above    Brief narrative: 81 y.o. male who presents for evaluation of nonhealing ulcers right foot. This Vietmanese patient does not speak [place]. He is accompanied by his son and daughter-in-law. Apparently he has had a nonhealing ulcer on the dorsum of the right foot and on the  heel for 3-6 months. He has gone periodically to the wound center. The ulcer has continued to worsen. He has not been seen by vascular surgeon. He had ABIs performed on 04/12/2015. ABI on the right was 0.42 and on the left was 0.86. Patient has had previous coronary artery bypass grafting in 2002. Saphenous vein was harvested from the right leg. Patient has been nonambulatory for at least 4 months. 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 Asian also carries a diagnosis of Alzheimer's disease, hypertension, diabetes mellitus.  Consultants:  Vascular surgery  Procedures:  None  Antibiotics: Anti-infectives    Start     Dose/Rate Route Frequency Ordered Stop   04/20/15 0600  vancomycin (VANCOCIN) 500 mg in sodium chloride 0.9 % 100 mL IVPB     500 mg 100 mL/hr over 60 Minutes Intravenous Every 12 hours 04/19/15 1606     04/19/15 2200  piperacillin-tazobactam (ZOSYN) IVPB 3.375 g     3.375 g 12.5 mL/hr over 240 Minutes Intravenous 3 times per day 04/19/15 1606     04/19/15 1545  piperacillin-tazobactam (ZOSYN) IVPB 3.375 g     3.375 g 100 mL/hr over 30 Minutes Intravenous  Once 04/19/15 1531 04/19/15 1636   04/19/15 1545  vancomycin (VANCOCIN) IVPB 1000 mg/200 mL premix     1,000 mg 200 mL/hr over 60 Minutes Intravenous  Once 04/19/15 1531 04/19/15 1741         HPI/Subjective: Patient agitated, in restraints, confused  Objective: Filed  Vitals:   04/20/15 0500 04/20/15 0600 04/20/15 0612 04/20/15 0800  BP: 172/70 202/176 122/69 129/66  Pulse: 117 131 124 122  Temp:    100 F (37.8 C)  TempSrc:    Oral  Resp: 36  Height:      Weight:      SpO2: 100% 100% 100% 99%    Intake/Output Summary (Last 24 hours) at 04/20/15 0956 Last data filed at 04/20/15 0800  Gross per 24 hour  Intake 876.25 ml  Output   1325 ml  Net -448.75 ml    Exam:  General: Confused, in restraints Lungs: Clear to  auscultation bilaterally without wheezes or crackles Cardiovascular: Regular rate and rhythm without murmur gallop or rub normal S1 and S2 Abdomen: Nontender, nondistended, soft, bowel sounds positive, no rebound, no ascites, no appreciable mass Extremities: No significant cyanosis, clubbing, or edema bilateral lower extremities     Data Review   Micro Results Recent Results (from the past 240 hour(s))  MRSA PCR Screening     Status: Abnormal   Collection Time: 04/19/15  7:30 PM  Result Value Ref Range Status   MRSA by PCR POSITIVE (A) NEGATIVE Final    Comment:        The GeneXpert MRSA Assay (FDA approved for NASAL specimens only), is one component of a comprehensive MRSA colonization surveillance program. It is not intended to diagnose MRSA infection nor to guide or monitor treatment for MRSA infections. RESULT CALLED TO, READ BACK BY AND VERIFIED WITH: CROFTS,S RN [phone] [id number] [facility]     Radiology Reports Dg Chest 1 View  04/19/2015  CLINICAL DATA:  Tachycardia. Diabetes and hypertension. Nonhealing right foot wound. Coronary artery disease. EXAM: CHEST 1 VIEW COMPARISON:  06/24/2013 FINDINGS: Heart size remains normal. Tortuosity of thoracic aorta is stable. Prior CABG again noted. Both lungs are clear. No evidence of pneumothorax or pleural effusion. IMPRESSION: No active disease. Electronically Signed   By: Myles Rosenthal M.D.   On: 04/19/2015 15:51   Ct Head Wo Contrast  03/23/2015  CLINICAL DATA:  Right facial hematoma status post fall. EXAM: CT HEAD WITHOUT CONTRAST CT CERVICAL SPINE WITHOUT CONTRAST TECHNIQUE: Multidetector CT imaging of the head and cervical spine was performed following the standard protocol without intravenous contrast. Multiplanar CT image reconstructions of the cervical spine were also generated. COMPARISON:  03/14/2015, 02/16/2015, 01/26/2015 FINDINGS: CT HEAD FINDINGS There is no evidence of mass effect, midline shift, or extra-axial fluid  collections. There is no evidence of a space-occupying lesion or intracranial hemorrhage. There is no evidence of a cortical-based area of acute infarction. There is an old right cerebellar infarct. There is an old left temporal parietal lobe infarct with encephalomalacia. There is generalized cerebral atrophy. There is periventricular white matter low attenuation likely secondary to microangiopathy. The ventricles and sulci are appropriate for the patient's age. The basal cisterns are patent. Visualized portions of the orbits are unremarkable. The visualized portions of the paranasal sinuses and mastoid air cells are unremarkable. Cerebrovascular atherosclerotic calcifications are noted. The osseous structures are unremarkable. There is a small right frontal scalp hematoma. CT CERVICAL SPINE FINDINGS The alignment is anatomic. The vertebral body heights are maintained. There is no acute fracture. There is no static listhesis. The prevertebral soft tissues are normal. The intraspinal soft tissues are not fully imaged on this examination due to poor soft tissue contrast, but there is no gross soft tissue abnormality. There is mild degenerative disc disease with disc height loss  at C3-4 and C7-T1. There is a moderate-sized central partially calcified disc protrusion effacing the ventral CSF space at C3-4 with bilateral uncovertebral degenerative changes resulting in bilateral foraminal stenosis. There is left facet arthropathy and moderate left foraminal stenosis at C2-3. There are bilateral uncovertebral degenerative changes at C6-7 with bilateral foraminal stenosis. There are degenerative changes of bilateral temporomandibular joints. The visualized portions of the lung apices demonstrate no focal abnormality. There is left carotid artery atherosclerosis. There is evidence of prior right carotid endarterectomy. IMPRESSION: 1. No acute intracranial pathology. 2. No acute osseous injury of the cervical spine. 3. Old  left MCA territory infarct. 4. Cervical spine spondylosis as described above, most severe at C3-4. Electronically Signed   By: Elige Ko   On: 03/23/2015 18:19   Ct Cervical Spine Wo Contrast  03/23/2015  CLINICAL DATA:  Right facial hematoma status post fall. EXAM: CT HEAD WITHOUT CONTRAST CT CERVICAL SPINE WITHOUT CONTRAST TECHNIQUE: Multidetector CT imaging of the head and cervical spine was performed following the standard protocol without intravenous contrast. Multiplanar CT image reconstructions of the cervical spine were also generated. COMPARISON:  03/14/2015, 02/16/2015, 01/26/2015 FINDINGS: CT HEAD FINDINGS There is no evidence of mass effect, midline shift, or extra-axial fluid collections. There is no evidence of a space-occupying lesion or intracranial hemorrhage. There is no evidence of a cortical-based area of acute infarction. There is an old right cerebellar infarct. There is an old left temporal parietal lobe infarct with encephalomalacia. There is generalized cerebral atrophy. There is periventricular white matter low attenuation likely secondary to microangiopathy. The ventricles and sulci are appropriate for the patient's age. The basal cisterns are patent. Visualized portions of the orbits are unremarkable. The visualized portions of the paranasal sinuses and mastoid air cells are unremarkable. Cerebrovascular atherosclerotic calcifications are noted. The osseous structures are unremarkable. There is a small right frontal scalp hematoma. CT CERVICAL SPINE FINDINGS The alignment is anatomic. The vertebral body heights are maintained. There is no acute fracture. There is no static listhesis. The prevertebral soft tissues are normal. The intraspinal soft tissues are not fully imaged on this examination due to poor soft tissue contrast, but there is no gross soft tissue abnormality. There is mild degenerative disc disease with disc height loss at C3-4 and C7-T1. There is a moderate-sized  central partially calcified disc protrusion effacing the ventral CSF space at C3-4 with bilateral uncovertebral degenerative changes resulting in bilateral foraminal stenosis. There is left facet arthropathy and moderate left foraminal stenosis at C2-3. There are bilateral uncovertebral degenerative changes at C6-7 with bilateral foraminal stenosis. There are degenerative changes of bilateral temporomandibular joints. The visualized portions of the lung apices demonstrate no focal abnormality. There is left carotid artery atherosclerosis. There is evidence of prior right carotid endarterectomy. IMPRESSION: 1. No acute intracranial pathology. 2. No acute osseous injury of the cervical spine. 3. Old left MCA territory infarct. 4. Cervical spine spondylosis as described above, most severe at C3-4. Electronically Signed   By: Elige Ko   On: 03/23/2015 18:19   Dg Foot 2 Views Right  04/19/2015  CLINICAL DATA:  81 year old male with right-sided foot pain. Nonhealing right foot wound. History of diabetes and hypertension. EXAM: RIGHT FOOT - 2 VIEW COMPARISON:  Right foot radiograph 03/31/2015. FINDINGS: No aggressive appearing areas of osteolytic cysts are noted. No acute displaced fracture, subluxation or dislocation. Multifocal joint space narrowing, subchondral sclerosis, subchondral cyst formation and osteophyte formation, compatible with osteoarthritis, most severe in the first MTP and  first interphalangeal joints. IMPRESSION: 1. No acute radiographic abnormality of the right foot. Chronic findings, as above Electronically Signed   By: Trudie Reed M.D.   On: 04/19/2015 15:52   Dg Foot 2 Views Right  03/31/2015  CLINICAL DATA:  Nonhealing ulcer on heel and on top near proximal metatarsals. EXAM: RIGHT FOOT - 2 VIEW COMPARISON:  None. FINDINGS: No acute bony abnormality. Specifically, no fracture, subluxation, or dislocation. No radiographic changes of osteomyelitis. Plantar and posterior calcaneal spurs  noted. Vascular calcifications present. IMPRESSION: No acute bony abnormality. Electronically Signed   By: Charlett Nose M.D.   On: 03/31/2015 14:09     CBC  Recent Labs Lab 04/19/15 1445 04/20/15 0340  WBC 18.7* 18.5*  HGB 15.8 14.4  HCT 48.7 44.9  PLT 409* 384  MCV 93.5 93.7  MCH 30.3 30.1  MCHC 32.4 32.1  RDW 14.0 14.0  LYMPHSABS 3.8  --   MONOABS 1.5*  --   EOSABS 0.0  --   BASOSABS 0.0  --     Chemistries   Recent Labs Lab 04/19/15 1445 04/20/15 0340  NA 151* 153*  K 3.8 2.9*  CL 113* 117*  CO2 26 27  GLUCOSE 184* 133*  BUN 27* 19  CREATININE 1.19 1.03  CALCIUM 10.2 9.2  MG  --  1.8  AST 48* 46*  ALT 34 33  ALKPHOS 132* 122  BILITOT 1.4* 1.3*   ------------------------------------------------------------------------------------------------------------------ estimated creatinine clearance is 42.8 mL/min (by C-G formula based on Cr of 1.03). ------------------------------------------------------------------------------------------------------------------ No results for input(s): HGBA1C in the last 72 hours. ------------------------------------------------------------------------------------------------------------------  Recent Labs  04/20/15 0340  CHOL 123  HDL 36*  LDLCALC 64  TRIG 981  CHOLHDL 3.4   ------------------------------------------------------------------------------------------------------------------  Recent Labs  04/20/15 0340  TSH 0.976   ------------------------------------------------------------------------------------------------------------------ No results for input(s): VITAMINB12, FOLATE, FERRITIN, TIBC, IRON, RETICCTPCT in the last 72 hours.  Coagulation profile  Recent Labs Lab 04/19/15 1947  INR 1.42    No results for input(s): DDIMER in the last 72 hours.  Cardiac Enzymes No results for input(s): CKMB, TROPONINI, MYOGLOBIN in the last 168 hours.  Invalid input(s):  CK ------------------------------------------------------------------------------------------------------------------ Invalid input(s): POCBNP   CBG:  Recent Labs Lab 04/19/15 1430 04/19/15 2131 04/20/15 0732  GLUCAP 184* 106* 178*       Studies: Dg Chest 1 View  04/19/2015  CLINICAL DATA:  Tachycardia. Diabetes and hypertension. Nonhealing right foot wound. Coronary artery disease. EXAM: CHEST 1 VIEW COMPARISON:  06/24/2013 FINDINGS: Heart size remains normal. Tortuosity of thoracic aorta is stable. Prior CABG again noted. Both lungs are clear. No evidence of pneumothorax or pleural effusion. IMPRESSION: No active disease. Electronically Signed   By: Myles Rosenthal M.D.   On: 04/19/2015 15:51   Dg Foot 2 Views Right  04/19/2015  CLINICAL DATA:  81 year old male with right-sided foot pain. Nonhealing right foot wound. History of diabetes and hypertension. EXAM: RIGHT FOOT - 2 VIEW COMPARISON:  Right foot radiograph 03/31/2015. FINDINGS: No aggressive appearing areas of osteolytic cysts are noted. No acute displaced fracture, subluxation or dislocation. Multifocal joint space narrowing, subchondral sclerosis, subchondral cyst formation and osteophyte formation, compatible with osteoarthritis, most severe in the first MTP and first interphalangeal joints. IMPRESSION: 1. No acute radiographic abnormality of the right foot. Chronic findings, as above Electronically Signed   By: Trudie Reed M.D.   On: 04/19/2015 15:52      Lab Results  Component Value Date   HGBA1C 7.1 11/25/2013   HGBA1C 7.9*  06/24/2013   HGBA1C 5.6 10/22/2012   Lab Results  Component Value Date   MICROALBUR 0.86 11/25/2013   LDLCALC 64 04/20/2015   CREATININE 1.03 04/20/2015       Scheduled Meds: . atorvastatin  20 mg Oral Daily  . Chlorhexidine Gluconate Cloth  6 each Topical Q0600  . enoxaparin (LOVENOX) injection  40 mg Subcutaneous Q24H  . fluocinonide-emollient  1 application Topical TID  . insulin  aspart  0-15 Units Subcutaneous TID WC  . memantine  10 mg Oral BID  . metoprolol tartrate  12.5 mg Oral BID  . mupirocin ointment  1 application Nasal BID  . piperacillin-tazobactam (ZOSYN)  IV  3.375 g Intravenous 3 times per day  . potassium chloride  40 mEq Oral TID  . QUEtiapine  25 mg Oral QHS  . sodium chloride  3 mL Intravenous Q12H  . vancomycin  500 mg Intravenous Q12H   Continuous Infusions: . dextrose 5 %-0.9% nacl with kcl 100 mL/hr at 04/20/15 0859    Active Problems:   Sepsis (HCC)   Pressure ulcer    Time spent: 45 minutes   [facility] Clare'S HospitalBROL,Leovanni Bjorkman  [facility] Pager [phone]. If 7PM-7AM, please contact night-coverage at [email or web address], password [email or web address] 04/20/2015, 9:56 AM  LOS: 1 day

## 2015-04-20 NOTE — Progress Notes (Signed)
Nurse from [facility] called to the unit. Update was given.

## 2015-04-20 NOTE — Care Management Note (Signed)
Case Management Note  Patient Details  Name: Jonathan Richards MRN: [id number] Date of Birth: 07/18/1933  Subjective/Objective: 81 [facility] Sepsis. From [facility]. CSW following for SNF.                   Action/Plan:d/c SNF.   Expected Discharge Date:   (unknown)               Expected Discharge Plan:  Skilled Nursing Facility  In-House Referral:  Clinical Social Work  Discharge planning Services  CM Consult  Post Acute Care Choice:    Choice offered to:     DME Arranged:    DME Agency:     HH Arranged:    HH Agency:     Status of Service:  In process, will continue to follow  Medicare Important Message Given:    Date Medicare IM Given:    Medicare IM give by:    Date Additional Medicare IM Given:    Additional Medicare Important Message give by:     If discussed at Long Length of Stay Meetings, dates discussed:    Additional Comments:  Lanier ClamMahabir, Karen Huhta, RN 04/20/2015, 3:04 PM

## 2015-04-20 NOTE — Progress Notes (Signed)
VASCULAR SURGERY  Pt seen in consultation by Dr. Hart RochesterLawson yesterday. [facility] Will try to schedule for R AKA Monday.  Waverly Ferrarihristopher Samuell Knoble, MD, FACS Beeper [phone] Office: [phone]

## 2015-04-20 NOTE — Progress Notes (Signed)
CRITICAL VALUE ALERT  Critical value received: Lactic Acid 2.5  Date of notification:  04-19-15  Time of notification:  23:00  Critical value read back:Yes.    Nurse who received alert:  Edison NasutiScott D Gini Caputo  MD notified (1st page):  [facility]  Time of first page:  23:30  MD notified (2nd page): N/A  Time of second page: N/A  Responding MD:  Merdis DelayK. Schorr  Time MD responded: 00:05

## 2015-04-20 NOTE — Progress Notes (Signed)
Date: April 20, 2015 Chart reviewed for concurrent status and case management needs. Will continue to follow patient for changes and needs: Marcelle Smilinghonda Yesena Reaves, RN, BSN, [place]   [phone]

## 2015-04-20 NOTE — Progress Notes (Signed)
Report called to Boneta LucksJenny, [place]. Patient is to be transferred to 1402.

## 2015-04-20 NOTE — Clinical Social Work Note (Signed)
Clinical Social Work Assessment  Patient Details  Name: Jonathan Richards MRN: [id number] Date of Birth: 1934-02-02  Date of referral:  04/20/15               Reason for consult:  Discharge Planning, Facility Placement                Permission sought to share information with:  Facility Art therapist granted to share information::  Yes, Verbal Permission Granted  Name::        Agency::     Relationship::     Contact Information:     Housing/Transportation Living arrangements for the past 2 months:  [place] of Information:  Adult Children Patient Interpreter Needed:  None Criminal Activity/Legal Involvement Pertinent to Current Situation/Hospitalization:  No - Comment as needed Significant Relationships:  Adult Children Lives with:  Facility Resident Do you feel safe Richards back to the place where you live?  No Need for family participation in patient care:  Yes (Comment)  Care giving concerns:  Family feels pt needs a higher level of care at d/c.   Social Worker assessment / plan: Pt hospitalized on 04/19/15 from [facility]. Pt has multiple medical dx's including sepsis, non healing ulcers on right foot / heel and dementia. Pt is unable to participate in d/c planning due to dementia. CSW met with pt's daughter in law this am. Jonathan Richards has requested assistance with nursing home placement / LTC. PT consult is pending. CSW will continue to follow to assist with d/c planning.  Employment status:  Retired Forensic scientist:  Information systems manager, Medicaid In [place] PT Recommendations:  Not assessed at this time Information / Referral to community resources:     Patient/Family's Response to care: Family reports that pt needs more care that ALF is able to provide.   Patient/Family's Understanding of and Emotional Response to Diagnosis, Current Treatment, and Prognosis:  Family is aware of pt's medical status. They would like pt to be dc to a  higher level of care when stable.   Emotional Assessment Appearance:  Appears stated age Attitude/Demeanor/Rapport:  Other (Pt can be uncooperative at times.) Affect (typically observed):  Anxious, Restless Orientation:   (Pt is disoriented x4) Alcohol / Substance use:  Not Applicable Psych involvement (Current and /or in the community):  No (Comment)  Discharge Needs  Concerns to be addressed:  Discharge Planning Concerns Readmission within the last 30 days:  No Current discharge risk:  None Barriers to Discharge:  No Barriers Identified   Luretha Rued, [place] 04/20/2015, 2:13 PM

## 2015-04-20 NOTE — Progress Notes (Signed)
Initial Nutrition Assessment  DOCUMENTATION CODES:   Severe malnutrition in context of acute illness/injury, Underweight  INTERVENTION:   -Provide Glucerna Shake po TID, each supplement provides 220 kcal and 10 grams of protein -Provide Prostat liquid protein PO 30 ml BID with meals, each supplement provides 100 kcal, 15 grams protein. -RD to continue to monitor  NUTRITION DIAGNOSIS:   Increased nutrient needs related to wound healing as evidenced by estimated needs.  GOAL:   Patient will meet greater than or equal to 90% of their needs  MONITOR:   PO intake, Supplement acceptance, Labs, Weight trends, Skin, I & O's  REASON FOR ASSESSMENT:   Malnutrition Screening Tool    ASSESSMENT:   81 y.o. male who presents for evaluation of nonhealing ulcers right foot. This Vietmanese patient does not speak [place]. He is accompanied by his son and daughter-in-law. Apparently he has had a nonhealing ulcer on the dorsum of the right foot and on the heel for 3-6 months.  Patient with dementia and unable to provide history. Per family, pt has not been eating or drinking x 1 week PTA. Pt with possible AKA scheduled for next week per vascular surgery note. Pt with multiple pressure wounds and would benefit from protein supplementation. RD to order Glucerna shakes and prostat supplements.  Per weight history, pt has lost 36 lb since 10/06 (24% weight loss x 1 month, significant for time frame).  Labs reviewed: CBGs: 106-178 Elevated Na Low K Mg WNL  Diet Order:  Diet Heart Room service appropriate?: Yes; Fluid consistency:: Thin  Skin:   Stage II heel ulcer, stage IV foot ulcer  Last BM:  PTA  Height:   Ht Readings from Last 1 Encounters:  04/19/15 5\' 7"  (1.702 m)    Weight:   Wt Readings from Last 1 Encounters:  04/20/15 114 lb 10.2 oz (52 kg)    Ideal Body Weight:  67.3 kg  BMI:  Body mass index is 17.95 kg/(m^2).  Estimated Nutritional Needs:   Kcal:   1550-1750  Protein:  75-85g  Fluid:  1.8L/day  EDUCATION NEEDS:   No education needs identified at this time  Tilda FrancoLindsey Nya Monds, MS, RD, LDN Pager: [phone] After Hours Pager: [phone]

## 2015-04-21 LAB — COMPREHENSIVE METABOLIC PANEL
ALBUMIN: 2.2 g/dL — AB (ref 3.5–5.0)
ALT: 36 U/L (ref 17–63)
AST: 47 U/L — AB (ref 15–41)
Alkaline Phosphatase: 105 U/L (ref 38–126)
Anion gap: 8 (ref 5–15)
BUN: 12 mg/dL (ref 6–20)
CALCIUM: 8.9 mg/dL (ref 8.9–10.3)
CHLORIDE: 124 mmol/L — AB (ref 101–111)
CO2: 22 mmol/L (ref 22–32)
CREATININE: 1.25 mg/dL — AB (ref 0.61–1.24)
GFR calc Af Amer: >60 mL/min (ref >60)
GFR, EST NON AFRICAN AMERICAN: 53 mL/min — AB (ref >60)
Glucose, Bld: 118 mg/dL — ABNORMAL HIGH (ref 65–99)
Potassium: 4.3 mmol/L (ref 3.5–5.1)
Sodium: 154 mmol/L — ABNORMAL HIGH (ref 135–145)
TOTAL PROTEIN: 6.7 g/dL (ref 6.5–8.1)
Total Bilirubin: 1.1 mg/dL (ref 0.3–1.2)

## 2015-04-21 LAB — CBC
HEMATOCRIT: 41.7 % (ref 39.0–52.0)
Hemoglobin: 13.2 g/dL (ref 13.0–17.0)
MCH: 29.6 pg (ref 26.0–34.0)
MCHC: 31.7 g/dL (ref 30.0–36.0)
MCV: 93.5 fL (ref 78.0–100.0)
PLATELETS: 297 10*3/uL (ref 150–400)
RBC: 4.46 MIL/uL (ref 4.22–5.81)
RDW: 14.2 % (ref 11.5–15.5)
WBC: 30.1 10*3/uL — AB (ref 4.0–10.5)

## 2015-04-21 LAB — TYPE AND SCREEN
ABO/RH(D): O POS
ANTIBODY SCREEN: NEGATIVE

## 2015-04-21 LAB — ABO/RH: ABO/RH(D): O POS

## 2015-04-21 LAB — GLUCOSE, CAPILLARY
GLUCOSE-CAPILLARY: 209 mg/dL — AB (ref 65–99)
GLUCOSE-CAPILLARY: 191 mg/dL — AB (ref 65–99)
Glucose-Capillary: 114 mg/dL — ABNORMAL HIGH (ref 65–99)
Glucose-Capillary: 183 mg/dL — ABNORMAL HIGH (ref 65–99)

## 2015-04-21 LAB — URINE CULTURE: Culture: 3000

## 2015-04-21 LAB — HEMOGLOBIN A1C
Hgb A1c MFr Bld: 6.8 % — ABNORMAL HIGH (ref 4.8–5.6)
Mean Plasma Glucose: 148 mg/dL

## 2015-04-21 LAB — VANCOMYCIN, TROUGH: Vancomycin Tr: 9 ug/mL — ABNORMAL LOW (ref 10.0–20.0)

## 2015-04-21 MED ORDER — VANCOMYCIN HCL IN DEXTROSE 750-5 MG/150ML-% IV SOLN
750.0000 mg | Freq: Two times a day (BID) | INTRAVENOUS | Status: DC
  Administered 2015-04-22 – 2015-04-25 (×8): 750 mg via INTRAVENOUS
  Filled 2015-04-21 (×9): qty 150

## 2015-04-21 MED ORDER — POTASSIUM CHLORIDE IN NACL 20-0.45 MEQ/L-% IV SOLN
INTRAVENOUS | Status: DC
  Administered 2015-04-21: 13:00:00 via INTRAVENOUS
  Administered 2015-04-21: 1000 mL via INTRAVENOUS
  Administered 2015-04-22: 11:00:00 via INTRAVENOUS
  Filled 2015-04-21 (×6): qty 1000

## 2015-04-21 MED ORDER — STARCH (THICKENING) PO POWD
ORAL | Status: DC | PRN
Start: 2015-04-21 — End: 2015-04-26
  Filled 2015-04-21: qty 227

## 2015-04-21 NOTE — Evaluation (Signed)
Clinical/Bedside Swallow Evaluation Patient Details  Name: Jonathan Richards MRN: [id number] Date of Birth: 06/07/1934  Today's Date: 04/21/2015 Time: SLP Start Time (ACUTE ONLY): 1540 SLP Stop Time (ACUTE ONLY): 1600 SLP Time Calculation (min) (ACUTE ONLY): 20 min  Past Medical History:  Past Medical History  Diagnosis Date  . Diabetes mellitus without complication (HCC)   . Hypertension   . Alzheimer disease   . CAD (coronary artery disease)   . Stroke [facility](HCC)    Past Surgical History:  Past Surgical History  Procedure Laterality Date  . Coronary artery bypass graft     HPI:  Pt is 81 y.o. male, [place], with hx of CVAs, dementia, admitted with sepsis secondary to nonhealing ulcer of right foot, acute delirium, more difficulty swallowing per dtr-in-law.  Pt with hx of dysphagia: has had at least two MBS studies (1/15, 12/15) which revealed mod dysphagia due to dementia. Pt has been on dysphagia 1, thin liquids subsequently.            Assessment / Plan / Recommendation Clinical Impression  Pt presents with an acute-on-chronic dysphagia impacted primarily by his MS: he was able to awaken briefly to attend to POs and initiate a swallow, but could not sustain attention in order to eat more than a few boluses.  Thin liquids elicited an immediate cough, concerning for aspiration.  He required total assist with feeding and cues to attend to purees due to oral holding.  Daughter-in-law present for assessment; she is very concerned about the patient's swallowing and whether staff will feed him in her absence.  Assured her he would be fed with care.  D/W RN: downgrade diet to dysphagia 1, nectar-thick liquids; crush meds in puree; provide careful handfeeding and hold tray if pt is not alert or is coughing.  SLP will follow.     Aspiration Risk  Moderate    Diet Recommendation Dysphagia 1 (Puree);Nectar   Medication Administration: Crushed with puree Compensations: Check for pocketing    Other   Recommendations Oral Care Recommendations: Oral care BID Other Recommendations: Order thickener from pharmacy   Follow Up Recommendations       Frequency and Duration min 3x week  2 weeks     Swallow Study Prior Functional Status       General Other Pertinent Information: Pt is 81 y.o. male, [place], with hx of CVAs, dementia, admitted with sepsis secondary to nonhealing ulcer of right foot, acute delirium, more difficulty swallowing per dtr-in-law.  Pt with hx of dysphagia: has had at least two MBS studies (1/15, 12/15) which revealed mod dysphagia due to dementia. Pt has been on dysphagia 1, thin liquids subsequently.          Type of Study: Bedside swallow evaluation Previous Swallow Assessment: yes - see hx Diet Prior to this Study: Dysphagia 3 (soft);Thin liquids Temperature Spikes Noted: No Respiratory Status: Room air History of Recent Intubation: No Behavior/Cognition: Lethargic/Drowsy Oral Cavity - Dentition: Edentulous Self-Feeding Abilities: Total assist Patient Positioning: Upright in bed Baseline Vocal Quality:  (no voice) Volitional Cough: Cognitively unable to elicit Volitional Swallow: Unable to elicit    Oral/Motor/Sensory Function Overall Oral Motor/Sensory Function:  (baseline right facial asymmetry)   Ice Chips Ice chips: Not tested   Thin Liquid Thin Liquid: Impaired Presentation: Spoon Oral Phase Impairments: Reduced labial seal;Poor awareness of bolus Oral Phase Functional Implications: Right anterior spillage Pharyngeal  Phase Impairments: Multiple swallows;Cough - Immediate    Nectar Thick Nectar Thick Liquid: Impaired Presentation: Spoon  Oral Phase Impairments: Reduced labial seal Pharyngeal Phase Impairments: Multiple swallows   Honey Thick Honey Thick Liquid: Not tested   Puree Puree: Impaired Presentation: Spoon Oral Phase Impairments: Reduced labial seal;Reduced lingual movement/coordination Pharyngeal Phase Impairments: Multiple swallows    Solid  Donnette Macmullen L. [place], [place] CCC/SLP Pager [phone]     Solid: Not tested       Blenda Mounts Laurice 04/21/2015,4:09 PM

## 2015-04-21 NOTE — Care Management Important Message (Signed)
Important Message  Patient Details  Name: Jonathan Richards MRN: [id number] Date of Birth: 09/26/1933   Medicare Important Message Given:  Yes-second notification given    Leone Havenaylor, Yitzchok Carriger [place], RN 04/21/2015, 5:14 PMImportant Message  Patient Details  Name: Jonathan Richards MRN: [id number] Date of Birth: 04/09/1934   Medicare Important Message Given:  Yes-second notification given    Leone Havenaylor, Kyair Ditommaso [place], RN 04/21/2015, 5:14 PM

## 2015-04-21 NOTE — Progress Notes (Signed)
[facility] PROGRESS NOTE  Jonathan Richards YQI:[id number] DOB: 09/27/1933 DOA: 04/19/2015 PCP: Florentina JennyRIPP, HENRY, MD  Length of stay: 2   Assessment/Plan: Active Problems:   Sepsis (HCC)   Pressure ulcer   Protein-calorie malnutrition, severe    Sepsis: Secondary to nonhealing ulcer of the right foot, with leukocytosis/lactic acidosis/sinus tachycardia , slowly improving , daughter by the bedside who thinks he is more alert today ,   cxr no acute findings, UA negative, MRSA PCR positive, blood culture no growth so far, continue vancomycin/Zosyn until R AKA Monday,then DC if BC negative  , [facility]  [facility]  Acute delirium could be secondary to #1 vs electrolyte abnormalities, hypernatremia, sodium 153, recheck labs   Hypernatremia/hypokalemia-cont  IV fluids   Nonhealing wound right foot: vascular surgery consulted, on abx /prn pain meds for now.ABIs reviewed which were 0.42 in the right posterior tibial and 0.86 in the left posterior tibial. Discussed with Dr. Roda ShuttersXu, he recommend [facility] for continued care by vascular surgery. NotifIed  Dr. Edilia Boickson  Who is on call today that the [facility] will be [facility], expected to have surgery on Monday    HTN, home meds norvasc/losartan held, continue Lopressor, dose increased yesterday in the setting of sinus tachycardia, heart rate is improving  HLD: continue statin  Dementia/FTT: continue namenda, seroquel, will need PT/OT and likely SNF placement.  Diabetes mellitus, continue sliding scale insulin, hold metformin, check hemoglobin A1c    DVT prophylaxsis Lovenox   Code Status:      Code Status Orders        Start     Ordered   04/19/15 1904  Full code   Continuous     04/19/15 1903     Family Communication: family updated about patient's clinical progress Disposition Plan:  Surgery on Monday    Brief narrative: 81 y.o. male who presents for evaluation of nonhealing  ulcers right foot. This Viet[place]ese patient does not speak [place]. He is accompanied by his son and daughter-in-law. Apparently he has had a nonhealing ulcer on the dorsum of the right foot and on the heel for 3-6 months. He has gone periodically to the wound center. The ulcer has continued to worsen. He has not been seen by vascular surgeon. He had ABIs performed on 04/12/2015. ABI on the right was 0.42 and on the left was 0.86. Patient has had previous coronary artery bypass grafting in 2002. Saphenous vein was harvested from the right leg. Patient has been nonambulatory for at least 4 months. 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 Asian also carries a diagnosis of Alzheimer's disease, hypertension, diabetes mellitus.  Consultants:  Vascular surgery  Procedures:  None  Antibiotics: Anti-infectives    Start     Dose/Rate Route Frequency Ordered Stop   04/20/15 0600  vancomycin (VANCOCIN) 500 mg in sodium chloride 0.9 % 100 mL IVPB     500 mg 100 mL/hr over 60 Minutes Intravenous Every 12 hours 04/19/15 1606     04/19/15 2200  piperacillin-tazobactam (ZOSYN) IVPB 3.375 g     3.375 g 12.5 mL/hr over 240 Minutes Intravenous 3 times per day 04/19/15 1606     04/19/15 1545  piperacillin-tazobactam (ZOSYN) IVPB 3.375 g     3.375 g 100 mL/hr over 30 Minutes Intravenous  Once 04/19/15 1531 04/19/15 1636   04/19/15 1545  vancomycin (  VANCOCIN) IVPB 1000 mg/200 mL premix     1,000 mg 200 mL/hr over 60 Minutes Intravenous  Once 04/19/15 1531 04/19/15 1741         HPI/Subjective: Patient's daughter is by the bedside, still in restraints as the patient keeps trying to get out of bed, according to the daughter the patient is much more alert  Objective: Filed Vitals:   04/20/15 2042 04/20/15 2330 04/21/15 0600 04/21/15 0656  BP: 148/61   157/78  Pulse: 108 103  97  Temp: 99.5 F (37.5 C)   98.8 F (37.1 C)  TempSrc: Oral    Oral  Resp: Height:    (1.702 m)   Weight:   53.2 kg (117 lb 4.6 oz)   SpO2: 100% 100%  100%    Intake/Output Summary (Last 24 hours) at 04/21/15 1110 Last data filed at 04/21/15 0700  Gross per 24 hour  Intake   2530 ml  Output    451 ml  Net   2079 ml    Exam:  General: Confused, in restraints Lungs: Clear to auscultation bilaterally without wheezes or crackles Cardiovascular: Regular rate and rhythm without murmur gallop or rub normal S1 and S2 Abdomen: Nontender, nondistended, soft, bowel sounds positive, no rebound, no ascites, no appreciable mass Extremities: No significant cyanosis, clubbing, or edema bilateral lower extremities     Data Review   Micro Results Recent Results (from the past 240 hour(s))  Culture, blood (routine x 2)     Status: None (Preliminary result)   Collection Time: 04/19/15  2:45 PM  Result Value Ref Range Status   Specimen Description BLOOD LEFT WRIST  Final   Special Requests BOTTLES DRAWN AEROBIC AND ANAEROBIC  Final   Culture   Final    NO GROWTH < 24 HOURS Performed at [facility]    Report Status PENDING  Incomplete  Culture, blood (routine x 2)     Status: None (Preliminary result)   Collection Time: 04/19/15  2:58 PM  Result Value Ref Range Status   Specimen Description BLOOD RIGHT ANTECUBITAL  Final   Special Requests BOTTLES DRAWN AEROBIC ONLY  Final   Culture   Final    NO GROWTH < 24 HOURS Performed at [facility]    Report Status PENDING  Incomplete  MRSA PCR Screening     Status: Abnormal   Collection Time: 04/19/15  7:30 PM  Result Value Ref Range Status   MRSA by PCR POSITIVE (A) NEGATIVE Final    Comment:        The GeneXpert MRSA Assay (FDA approved for NASAL specimens only), is one component of a comprehensive MRSA colonization surveillance program. It is not intended to diagnose MRSA infection nor to guide or monitor treatment for MRSA infections. RESULT CALLED TO,  READ BACK BY AND VERIFIED WITH: CROFTS,S RN [phone] [id number] COVINGTON,N   Urine culture     Status: None   Collection Time: 04/19/15  9:57 PM  Result Value Ref Range Status   Specimen Description URINE, CATHETERIZED  Final   Special Requests NONE  Final   Culture   Final    3,000 COLONIES/mL INSIGNIFICANT GROWTH Performed at [facility]    Report Status 04/21/2015 FINAL  Final    Radiology Reports Dg Chest 1 View  04/19/2015  CLINICAL DATA:  Tachycardia. Diabetes and hypertension. Nonhealing right foot wound. Coronary artery disease. EXAM: CHEST 1 VIEW COMPARISON:  06/24/2013 FINDINGS:  Heart size remains normal. Tortuosity of thoracic aorta is stable. Prior CABG again noted. Both lungs are clear. No evidence of pneumothorax or pleural effusion. IMPRESSION: No active disease. Electronically Signed   By: Myles Rosenthal M.D.   On: 04/19/2015 15:51   Ct Head Wo Contrast  03/23/2015  CLINICAL DATA:  Right facial hematoma status post fall. EXAM: CT HEAD WITHOUT CONTRAST CT CERVICAL SPINE WITHOUT CONTRAST TECHNIQUE: Multidetector CT imaging of the head and cervical spine was performed following the standard protocol without intravenous contrast. Multiplanar CT image reconstructions of the cervical spine were also generated. COMPARISON:  03/14/2015, 02/16/2015, 01/26/2015 FINDINGS: CT HEAD FINDINGS There is no evidence of mass effect, midline shift, or extra-axial fluid collections. There is no evidence of a space-occupying lesion or intracranial hemorrhage. There is no evidence of a cortical-based area of acute infarction. There is an old right cerebellar infarct. There is an old left temporal parietal lobe infarct with encephalomalacia. There is generalized cerebral atrophy. There is periventricular white matter low attenuation likely secondary to microangiopathy. The ventricles and sulci are appropriate for the patient's age. The basal cisterns are patent. Visualized portions of the orbits are  unremarkable. The visualized portions of the paranasal sinuses and mastoid air cells are unremarkable. Cerebrovascular atherosclerotic calcifications are noted. The osseous structures are unremarkable. There is a small right frontal scalp hematoma. CT CERVICAL SPINE FINDINGS The alignment is anatomic. The vertebral body heights are maintained. There is no acute fracture. There is no static listhesis. The prevertebral soft tissues are normal. The intraspinal soft tissues are not fully imaged on this examination due to poor soft tissue contrast, but there is no gross soft tissue abnormality. There is mild degenerative disc disease with disc height loss at C3-4 and C7-T1. There is a moderate-sized central partially calcified disc protrusion effacing the ventral CSF space at C3-4 with bilateral uncovertebral degenerative changes resulting in bilateral foraminal stenosis. There is left facet arthropathy and moderate left foraminal stenosis at C2-3. There are bilateral uncovertebral degenerative changes at C6-7 with bilateral foraminal stenosis. There are degenerative changes of bilateral temporomandibular joints. The visualized portions of the lung apices demonstrate no focal abnormality. There is left carotid artery atherosclerosis. There is evidence of prior right carotid endarterectomy. IMPRESSION: 1. No acute intracranial pathology. 2. No acute osseous injury of the cervical spine. 3. Old left MCA territory infarct. 4. Cervical spine spondylosis as described above, most severe at C3-4. Electronically Signed   By: Elige Ko   On: 03/23/2015 18:19   Ct Cervical Spine Wo Contrast  03/23/2015  CLINICAL DATA:  Right facial hematoma status post fall. EXAM: CT HEAD WITHOUT CONTRAST CT CERVICAL SPINE WITHOUT CONTRAST TECHNIQUE: Multidetector CT imaging of the head and cervical spine was performed following the standard protocol without intravenous contrast. Multiplanar CT image reconstructions of the cervical spine  were also generated. COMPARISON:  03/14/2015, 02/16/2015, 01/26/2015 FINDINGS: CT HEAD FINDINGS There is no evidence of mass effect, midline shift, or extra-axial fluid collections. There is no evidence of a space-occupying lesion or intracranial hemorrhage. There is no evidence of a cortical-based area of acute infarction. There is an old right cerebellar infarct. There is an old left temporal parietal lobe infarct with encephalomalacia. There is generalized cerebral atrophy. There is periventricular white matter low attenuation likely secondary to microangiopathy. The ventricles and sulci are appropriate for the patient's age. The basal cisterns are patent. Visualized portions of the orbits are unremarkable. The visualized portions of the paranasal sinuses and mastoid air  cells are unremarkable. Cerebrovascular atherosclerotic calcifications are noted. The osseous structures are unremarkable. There is a small right frontal scalp hematoma. CT CERVICAL SPINE FINDINGS The alignment is anatomic. The vertebral body heights are maintained. There is no acute fracture. There is no static listhesis. The prevertebral soft tissues are normal. The intraspinal soft tissues are not fully imaged on this examination due to poor soft tissue contrast, but there is no gross soft tissue abnormality. There is mild degenerative disc disease with disc height loss at C3-4 and C7-T1. There is a moderate-sized central partially calcified disc protrusion effacing the ventral CSF space at C3-4 with bilateral uncovertebral degenerative changes resulting in bilateral foraminal stenosis. There is left facet arthropathy and moderate left foraminal stenosis at C2-3. There are bilateral uncovertebral degenerative changes at C6-7 with bilateral foraminal stenosis. There are degenerative changes of bilateral temporomandibular joints. The visualized portions of the lung apices demonstrate no focal abnormality. There is left carotid artery  atherosclerosis. There is evidence of prior right carotid endarterectomy. IMPRESSION: 1. No acute intracranial pathology. 2. No acute osseous injury of the cervical spine. 3. Old left MCA territory infarct. 4. Cervical spine spondylosis as described above, most severe at C3-4. Electronically Signed   By: Elige Ko   On: 03/23/2015 18:19   Dg Foot 2 Views Right  04/19/2015  CLINICAL DATA:  80 year old male with right-sided foot pain. Nonhealing right foot wound. History of diabetes and hypertension. EXAM: RIGHT FOOT - 2 VIEW COMPARISON:  Right foot radiograph 03/31/2015. FINDINGS: No aggressive appearing areas of osteolytic cysts are noted. No acute displaced fracture, subluxation or dislocation. Multifocal joint space narrowing, subchondral sclerosis, subchondral cyst formation and osteophyte formation, compatible with osteoarthritis, most severe in the first MTP and first interphalangeal joints. IMPRESSION: 1. No acute radiographic abnormality of the right foot. Chronic findings, as above Electronically Signed   By: Trudie Reed M.D.   On: 04/19/2015 15:52   Dg Foot 2 Views Right  03/31/2015  CLINICAL DATA:  Nonhealing ulcer on heel and on top near proximal metatarsals. EXAM: RIGHT FOOT - 2 VIEW COMPARISON:  None. FINDINGS: No acute bony abnormality. Specifically, no fracture, subluxation, or dislocation. No radiographic changes of osteomyelitis. Plantar and posterior calcaneal spurs noted. Vascular calcifications present. IMPRESSION: No acute bony abnormality. Electronically Signed   By: Charlett Nose M.D.   On: 03/31/2015 14:09     CBC  Recent Labs Lab 04/19/15 1445 04/20/15 0340  WBC 18.7* 18.5*  HGB 15.8 14.4  HCT 48.7 44.9  PLT 409* 384  MCV 93.5 93.7  MCH 30.3 30.1  MCHC 32.4 32.1  RDW 14.0 14.0  LYMPHSABS 3.8  --   MONOABS 1.5*  --   EOSABS 0.0  --   BASOSABS 0.0  --     Chemistries   Recent Labs Lab 04/19/15 1445 04/20/15 0340  NA 151* 153*  K 3.8 2.9*  CL 113*  117*  CO2 26 27  GLUCOSE 184* 133*  BUN 27* 19  CREATININE 1.19 1.03  CALCIUM 10.2 9.2  MG  --  1.8  AST 48* 46*  ALT 34 33  ALKPHOS 132* 122  BILITOT 1.4* 1.3*   ------------------------------------------------------------------------------------------------------------------ estimated creatinine clearance is 43.8 mL/min (by C-G formula based on Cr of 1.03). ------------------------------------------------------------------------------------------------------------------  Recent Labs  04/20/15 0340  HGBA1C 6.8*   ------------------------------------------------------------------------------------------------------------------  Recent Labs  04/20/15 0340  CHOL 123  HDL 36*  LDLCALC 64  TRIG 161  CHOLHDL 3.4   ------------------------------------------------------------------------------------------------------------------  Recent Labs  04/20/15 0340  TSH 0.976   ------------------------------------------------------------------------------------------------------------------ No results for input(s): VITAMINB12, FOLATE, FERRITIN, TIBC, IRON, RETICCTPCT in the last 72 hours.  Coagulation profile  Recent Labs Lab 04/19/15 1947  INR 1.42    No results for input(s): DDIMER in the last 72 hours.  Cardiac Enzymes No results for input(s): CKMB, TROPONINI, MYOGLOBIN in the last 168 hours.  Invalid input(s): CK ------------------------------------------------------------------------------------------------------------------ Invalid input(s): POCBNP   CBG:  Recent Labs Lab 04/20/15 0732 04/20/15 1118 04/20/15 1704 04/20/15 2233 04/21/15 0809  GLUCAP 178* 150* 139* 197* 209*       Studies: Dg Chest 1 View  04/19/2015  CLINICAL DATA:  Tachycardia. Diabetes and hypertension. Nonhealing right foot wound. Coronary artery disease. EXAM: CHEST 1 VIEW COMPARISON:  06/24/2013 FINDINGS: Heart size remains normal. Tortuosity of thoracic aorta is stable. Prior  CABG again noted. Both lungs are clear. No evidence of pneumothorax or pleural effusion. IMPRESSION: No active disease. Electronically Signed   By: Myles Rosenthal M.D.   On: 04/19/2015 15:51   Dg Foot 2 Views Right  04/19/2015  CLINICAL DATA:  81 year old male with right-sided foot pain. Nonhealing right foot wound. History of diabetes and hypertension. EXAM: RIGHT FOOT - 2 VIEW COMPARISON:  Right foot radiograph 03/31/2015. FINDINGS: No aggressive appearing areas of osteolytic cysts are noted. No acute displaced fracture, subluxation or dislocation. Multifocal joint space narrowing, subchondral sclerosis, subchondral cyst formation and osteophyte formation, compatible with osteoarthritis, most severe in the first MTP and first interphalangeal joints. IMPRESSION: 1. No acute radiographic abnormality of the right foot. Chronic findings, as above Electronically Signed   By: Trudie Reed M.D.   On: 04/19/2015 15:52      Lab Results  Component Value Date   HGBA1C 6.8* 04/20/2015   HGBA1C 7.1 11/25/2013   HGBA1C 7.9* 06/24/2013   Lab Results  Component Value Date   MICROALBUR 0.86 11/25/2013   LDLCALC 64 04/20/2015   CREATININE 1.03 04/20/2015       Scheduled Meds: . atorvastatin  20 mg Oral Daily  . Chlorhexidine Gluconate Cloth  6 each Topical Q0600  . collagenase   Topical Daily  . enoxaparin (LOVENOX) injection  40 mg Subcutaneous Q24H  . feeding supplement (GLUCERNA SHAKE)  237 mL Oral TID BM  . feeding supplement (PRO-STAT SUGAR FREE 64)  30 mL Oral BID  . fluocinonide-emollient  1 application Topical TID  . insulin aspart  0-15 Units Subcutaneous TID WC  . memantine  10 mg Oral BID  . metoprolol tartrate  25 mg Oral BID  . mupirocin ointment  1 application Nasal BID  . piperacillin-tazobactam (ZOSYN)  IV  3.375 g Intravenous 3 times per day  . QUEtiapine  25 mg Oral QHS  . sodium chloride  3 mL Intravenous Q12H  . vancomycin  500 mg Intravenous Q12H   Continuous  Infusions:    Active Problems:   Sepsis (HCC)   Pressure ulcer   Protein-calorie malnutrition, severe    Time spent: 45 minutes   [facility]  [facility] Pager [phone]. If 7PM-7AM, please contact night-coverage at [email or web address], password [facility] 04/21/2015, 11:10 AM  LOS: 2 days

## 2015-04-21 NOTE — Evaluation (Signed)
Physical Therapy Evaluation Patient Details Name: Jonathan Richards MRN: [id number] DOB: 09/01/1933 Today's Date: 04/21/2015   History of Present Illness  Pt adm with sepsis secondary to nonhealing ulcer of the right foot. Pt to have Rt AKA next week. PMH - severe dementia, lt CVA, HTN, DM.  Clinical Impression  Pt requires total care and is unable to participate in therapy due to severe dementia. At dc will need facility that can provide total care.    Follow Up Recommendations Other (comment) (Long term total care)    Equipment Recommendations  None recommended by PT    Recommendations for Other Services       Precautions / Restrictions Precautions Precautions: Fall      Mobility  Bed Mobility Overal bed mobility: Needs Assistance Bed Mobility: Supine to Sit;Sit to Supine     Supine to sit: Total assist Sit to supine: Total assist   General bed mobility comments: Able to partially sit up with trunk up and LLE off EOB but pt pushing back into supine.  Transfers                    Ambulation/Gait                Stairs            Wheelchair Mobility    Modified Rankin (Stroke Patients Only)       Balance                                             Pertinent Vitals/Pain Pain Assessment: Faces Faces Pain Scale: Hurts even more Pain Location: rt leg if attempted to straighten Pain Descriptors / Indicators: Grimacing Pain Intervention(s): Limited activity within patient's tolerance;Repositioned    Home Living Family/patient expects to be discharged to:: Skilled nursing facility                      Prior Function Level of Independence: Needs assistance   Gait / Transfers Assistance Needed: Per chart pt nonambulatory >4 months           Hand Dominance        Extremity/Trunk Assessment               Lower Extremity Assessment: RLE deficits/detail;LLE deficits/detail RLE Deficits / Details: RLE  held in flexed position. Attempted to straighten passively but pt unable to tolerate LLE Deficits / Details: Active movement noted     Communication   Communication: Other (comment) (groans and cries out but otherwise nonverbal)  Cognition Arousal/Alertness: Lethargic Behavior During Therapy: Flat affect Overall Cognitive Status: Difficult to assess                      General Comments      Exercises        Assessment/Plan    PT Assessment Patent does not need any further PT services  PT Diagnosis Generalized weakness   PT Problem List    PT Treatment Interventions     PT Goals (Current goals can be found in the Care Plan section) Acute Rehab PT Goals PT Goal Formulation: All assessment and education complete, DC therapy    Frequency     Barriers to discharge        Co-evaluation  End of Session   Activity Tolerance: Patient limited by pain;Other (comment) (dementia) Patient left: in bed;with call bell/phone within reach;with bed alarm set Nurse Communication: Mobility status         Time: 1610-9604 PT Time Calculation (min) (ACUTE ONLY): 11 min   Charges:   PT Evaluation $Initial PT Evaluation Tier I: 1 Procedure     PT G Codes:        Jonathan Richards 2015/05/10, 2:12 PM [facility] PT [phone]

## 2015-04-21 NOTE — Progress Notes (Signed)
ANTIBIOTIC CONSULT NOTE - FOLLOW UP  Pharmacy Consult for Vancomycin and Zosyn Indication: sepsis  Allergies  Allergen Reactions  . Asa [Aspirin] Other (See Comments)    Per daughter, pt is not allergic to ASA, but chart has this listed as an allergy.   Patient Measurements: Height: 5\' 7"  (170.2 cm) Weight: 117 lb 4.6 oz (53.2 kg) IBW/kg (Calculated) : 66.1  Vital Signs: Temp: 98.8 F (37.1 C) (11/04 0656) Temp Source: Oral (11/04 0656) BP: 157/78 mmHg (11/04 0656) Pulse Rate: 97 (11/04 0656) Intake/Output from previous day: 11/03 0701 - 11/04 0700 In: 2806.7 [P.O.:180; I.V.:2201.7; IV Piggyback:350] Out: 751 [Urine:751] Intake/Output from this shift: Total I/O In: -  Out: 300 [Urine:300]  Labs:  Recent Labs  04/19/15 1445 04/20/15 0340 04/21/15 1107  WBC 18.7* 18.5* 30.1*  HGB 15.8 14.4 13.2  PLT 409* 384 297  CREATININE 1.19 1.03 1.25*   Estimated Creatinine Clearance: 36.1 mL/min (by C-G formula based on Cr of 1.25).  Recent Labs  04/21/15 1645  VANCOTROUGH 9*    Assessment: 81yof continues on day #3 vancomycin and zosyn for sepsis 2/2 nonhealing ulcer of her right foot. Vancomycin trough drawn this evening is below goal at 9 (though drawn 45 minutes early so likely even lower). Serum creatinine is trending up (1.03-->1.25). Zosyn dose remains appropriate.  Vancomycin 11/2>> Zosyn 11/2>>   11/2 bloodx2: IP 11/2 MRSA PCR+ (CHG/Mupirocin ordered) 11/2 urine: insig growth  11/2 Foot Xray: negative for osteo  Goal of Therapy:  Vancomycin trough level 15-20 mcg/ml  Plan:  1) Increase vancomycin to 750mg  IV q12 2) Continue zosyn 3.375g IV q8 (4 hour infusion)  Fredrik RiggerMarkle, Taylan Mayhan Sue 04/21/2015,6:08 PM

## 2015-04-21 NOTE — Care Management Note (Addendum)
Case Management Note  Patient Details  Name: Jonathan Richards MRN: [id number] Date of Birth: 05/09/1934  Subjective/Objective:     Date:04/21/15 Spoke with patient at the bedside along with Tyrone Appleham, Tuyet (daughter n law)  [phone]. Introduced self as Sports coachcase manager and explained role in discharge planning and how to be reached. Verified patient lives in [facility] [place]. Expressed potential need for no other DME. Verified patient anticipates to go  SNF at time of discharge. Patient will have no supervison at home at this time to best of their knowledge.  Patient will need long term placement. Patient denied needing help with their medication. Patient  is driven by family  to MD appointments, but they can not do this. Verified patient has PCP Florentina JennyHenry Tripp.   Plan: CM will continue to follow for discharge planning and [facility] resources.                Action/Plan:   Expected Discharge Date:   (unknown)               Expected Discharge Plan:  Skilled Nursing Facility  In-House Referral:  Clinical Social Work  Discharge planning Services  CM Consult  Post Acute Care Choice:    Choice offered to:     DME Arranged:    DME Agency:     HH Arranged:    HH Agency:     Status of Service:  In process, will continue to follow  Medicare Important Message Given:    Date Medicare IM Given:    Medicare IM give by:    Date Additional Medicare IM Given:    Additional Medicare Important Message give by:     If discussed at Long Length of Stay Meetings, dates discussed:    Additional Comments:  Leone Havenaylor, Garnell Begeman Clinton, RN 04/21/2015, 3:42 PM

## 2015-04-22 DIAGNOSIS — G9340 Encephalopathy, unspecified: Secondary | ICD-10-CM

## 2015-04-22 DIAGNOSIS — E876 Hypokalemia: Secondary | ICD-10-CM

## 2015-04-22 LAB — COMPREHENSIVE METABOLIC PANEL
ALBUMIN: 1.9 g/dL — AB (ref 3.5–5.0)
ALK PHOS: 108 U/L (ref 38–126)
ALT: 35 U/L (ref 17–63)
ANION GAP: 10 (ref 5–15)
AST: 44 U/L — ABNORMAL HIGH (ref 15–41)
BILIRUBIN TOTAL: 1.7 mg/dL — AB (ref 0.3–1.2)
BUN: 15 mg/dL (ref 6–20)
CALCIUM: 8.5 mg/dL — AB (ref 8.9–10.3)
CO2: 21 mmol/L — ABNORMAL LOW (ref 22–32)
Chloride: 117 mmol/L — ABNORMAL HIGH (ref 101–111)
Creatinine, Ser: 1.03 mg/dL (ref 0.61–1.24)
GFR calc non Af Amer: >60 mL/min (ref >60)
GLUCOSE: 128 mg/dL — AB (ref 65–99)
POTASSIUM: 4 mmol/L (ref 3.5–5.1)
SODIUM: 148 mmol/L — AB (ref 135–145)
TOTAL PROTEIN: 6.1 g/dL — AB (ref 6.5–8.1)

## 2015-04-22 LAB — CBC
HEMATOCRIT: 38.6 % — AB (ref 39.0–52.0)
Hemoglobin: 12.4 g/dL — ABNORMAL LOW (ref 13.0–17.0)
MCH: 29.7 pg (ref 26.0–34.0)
MCHC: 32.1 g/dL (ref 30.0–36.0)
MCV: 92.3 fL (ref 78.0–100.0)
Platelets: 239 10*3/uL (ref 150–400)
RBC: 4.18 MIL/uL — ABNORMAL LOW (ref 4.22–5.81)
RDW: 14.4 % (ref 11.5–15.5)
WBC: 24.6 10*3/uL — ABNORMAL HIGH (ref 4.0–10.5)

## 2015-04-22 LAB — GLUCOSE, CAPILLARY
GLUCOSE-CAPILLARY: 140 mg/dL — AB (ref 65–99)
GLUCOSE-CAPILLARY: 172 mg/dL — AB (ref 65–99)
GLUCOSE-CAPILLARY: 254 mg/dL — AB (ref 65–99)
Glucose-Capillary: 148 mg/dL — ABNORMAL HIGH (ref 65–99)

## 2015-04-22 MED ORDER — TRAMADOL HCL 50 MG PO TABS
50.0000 mg | ORAL_TABLET | Freq: Two times a day (BID) | ORAL | Status: DC | PRN
  Administered 2015-04-24: 50 mg via ORAL
  Filled 2015-04-22 (×2): qty 1

## 2015-04-22 MED ORDER — DEXTROSE 5 % IV SOLN
INTRAVENOUS | Status: DC
  Administered 2015-04-22: 16:00:00 via INTRAVENOUS

## 2015-04-22 MED ORDER — ACETAMINOPHEN 325 MG PO TABS: 650.0000 mg | ORAL_TABLET | Freq: Four times a day (QID) | ORAL | Status: DC | PRN

## 2015-04-22 MED ORDER — QUETIAPINE FUMARATE 25 MG PO TABS
12.5000 mg | ORAL_TABLET | Freq: Every day | ORAL | Status: DC
  Administered 2015-04-22 – 2015-04-25 (×4): 12.5 mg via ORAL
  Filled 2015-04-22 (×4): qty 1

## 2015-04-22 NOTE — Progress Notes (Signed)
Patient ID: Jonathan Richards, male   DOB: 12/27/1933, 81 y.o.   MRN: [id number] Hemodynamically stable. Patient is unable to communicate. Has contraction of right hip and knee with a dressing intact. Plan for right above-knee amputation on Monday, 04/24/2015

## 2015-04-22 NOTE — Progress Notes (Signed)
Speech Language Pathology Treatment: Dysphagia  Patient Details Name: Jonathan Richards MRN: [id number] DOB: 07/16/1933 Today's Date: 04/22/2015 Time: [id number] SLP Time Calculation (min) (ACUTE ONLY): 21 min  Assessment / Plan / Recommendation Clinical Impression  Pt seen for skilled observation/tolerance of current dysphagia 1 (puree) and nectar thick liquid diet. Nursing reports lethargy this am, without any PO consumption of breakfast meal. Pt able to awaken for SLP with moderate verbal and tactile cues. Pt displayed good oral readiness, opening mouth when puree and nectar thick boluses were presented. Pt displayed prolonged mastication and moderately delayed AP transfer of bolus with suspected delay in swallow initiation. Noted intermitent delayed throat clearing and wet vocal quality following puree consistency which cleared with additional reflexive swallow by the pt. Coughing x1 observed with puree magic cup trials. Alternation of nectar thick liquids, appeared to aid in reduced throat clearing, suspect improved oropharyngeal clearance. No overt signs or symptoms of aspiration with nectar thick liquids via teaspoon or cup. Concern for malnutrition given fluctuating mentation. Pt continues to be at moderate risk for aspiration given advanced cognitive deficits which negatively impact severity of dysphagia. Continue nectar thick/ dysphagia 1 (puree) diet. ST to follow up continued caregiver education and monitoring of diet tolerance.     HPI Other Pertinent Information: Pt is 81 y.o. male, [place], with hx of CVAs, dementia, admitted with sepsis secondary to nonhealing ulcer of right foot, acute delirium, more difficulty swallowing per dtr-in-law.  Pt with hx of dysphagia: has had at least two MBS studies (1/15, 12/15) which revealed mod dysphagia due to dementia. Pt has been on dysphagia 1, thin liquids subsequently.            Pertinent Vitals Pain Assessment: Faces Pain Score: 5   SLP Plan  Continue with current plan of care    Recommendations Diet recommendations: Dysphagia 1 (puree);Nectar-thick liquid Liquids provided via: Teaspoon;Cup Medication Administration: Crushed with puree Supervision: Full supervision/cueing for compensatory strategies;Staff to assist with self feeding Compensations: Small sips/bites;Follow solids with liquid Postural Changes and/or Swallow Maneuvers: Seated upright 90 degrees              Oral Care Recommendations: Oral care BID Follow up Recommendations: 24 hour supervision/assistance Plan: Continue with current plan of care    GO    Marcene Duoshelsea Sumney MA, CCC-SLP Acute Care Speech Language Pathologist     Kennieth RadSumney, Shah Insley E 04/22/2015, 11:16 AM

## 2015-04-22 NOTE — NC FL2 (Signed)
[place] MEDICAID FL2 LEVEL OF CARE SCREENING TOOL     IDENTIFICATION  Patient Name: Jonathan Richards Birthdate: 1933/09/11 Sex: male Admission Date (Current Location): 04/19/2015  [place] Number: Haynes Bast [id number] Facility and Address:  [facility], [address], [place], [place] [address]      Provider Number: [id number]  Attending Physician Name and Address:  Elease Etienne, MD  Relative Name and Phone Number:       Current Level of Care: Hospital Recommended Level of Care: Nursing Facility Prior Approval Number:    Date Approved/Denied:   PASRR Number:    Discharge Plan: SNF    Current Diagnoses: Patient Active Problem List   Diagnosis Date Noted  . Protein-calorie malnutrition, severe 04/20/2015  . Sepsis (HCC) 04/19/2015  . Pressure ulcer 04/19/2015  . Acute intracranial hemorrhage (HCC) 06/24/2013  . Hypertension 10/22/2012  . Dementia 10/22/2012  . Type 2 diabetes mellitus (HCC) 10/22/2012  . Incontinence 10/22/2012  . Psoriasis of scalp 10/22/2012    Orientation ACTIVITIES/SOCIAL BLADDER RESPIRATION       Passive Incontinent Normal  BEHAVIORAL SYMPTOMS/MOOD NEUROLOGICAL BOWEL NUTRITION STATUS      Incontinent Diet  PHYSICIAN VISITS COMMUNICATION OF NEEDS Height & Weight Skin  30 days Non-Verbally  (170.2 cm) 117 lbs. PU Stage and Appropriate Care          AMBULATORY STATUS RESPIRATION    Assist extensive Normal      Personal Care Assistance Level of Assistance  Bathing, Feeding, Dressing, Total care Bathing Assistance: Maximum assistance Feeding assistance: Maximum assistance Dressing Assistance: Maximum assistance Total Care Assistance: Maximum assistance    Functional Limitations Info  Sight, Hearing Sight Info: Adequate Hearing Info: Adequate Speech Info: Adequate       SPECIAL CARE FACTORS FREQUENCY                      Additional Factors Info  Insulin Sliding Scale        Insulin Sliding Scale Info: Sliding scale TID       Current Medications (04/22/2015): Current Facility-Administered Medications  Medication Dose Route Frequency Provider Last Rate Last Dose  . acetaminophen (TYLENOL) tablet 650 mg  650 mg Oral Q6H PRN Elease Etienne, MD      . atorvastatin (LIPITOR) tablet 20 mg  20 mg Oral Daily Albertine Grates, MD   20 mg at 04/22/15 1059  . Chlorhexidine Gluconate Cloth 2 % PADS 6 each  6 each Topical Q0600 Albertine Grates, MD   6 each at 04/22/15 [phone]  . collagenase (SANTYL) ointment   Topical Daily Richarda Overlie, MD      . dextrose 5 % solution   Intravenous Continuous Elease Etienne, MD      . enoxaparin (LOVENOX) injection 40 mg  40 mg Subcutaneous Q24H Albertine Grates, MD   40 mg at 04/21/15 2108  . feeding supplement (GLUCERNA SHAKE) (GLUCERNA SHAKE) liquid 237 mL  237 mL Oral TID BM Tilda Franco, RD   237 mL at 04/22/15 1102  . feeding supplement (PRO-STAT SUGAR FREE 64) liquid 30 mL  30 mL Oral BID Tilda Franco, RD   30 mL at 04/22/15 1058  . fluocinonide-emollient (LIDEX-E) 0.05 % cream 1 application  1 application Topical TID Albertine Grates, MD   1 application at 04/22/15 1101  . food thickener (THICK IT) powder   Oral PRN Richarda Overlie, MD      . hydrocerin (EUCERIN) cream  Topical BID PRN Albertine GratesFang Xu, MD      . insulin aspart (novoLOG) injection 0-15 Units  0-15 Units Subcutaneous TID WC Albertine GratesFang Xu, MD   2 Units at 04/22/15 1300  . memantine (NAMENDA) tablet 10 mg  10 mg Oral BID Albertine GratesFang Xu, MD   10 mg at 04/22/15 1059  . metoprolol tartrate (LOPRESSOR) tablet 25 mg  25 mg Oral BID Richarda OverlieNayana Abrol, MD   25 mg at 04/22/15 1059  . mupirocin ointment (BACTROBAN) 2 % 1 application  1 application Nasal BID Albertine GratesFang Xu, MD   1 application at 04/22/15 1101  . piperacillin-tazobactam (ZOSYN) IVPB 3.375 g  3.375 g Intravenous 3 times per day Vanetta MuldersScott Zackowski, MD   3.375 g at 04/22/15 1330  . QUEtiapine (SEROQUEL) tablet 12.5 mg  12.5 mg Oral QHS Elease EtienneAnand D Hongalgi, MD      . sodium chloride 0.9 %  injection 3 mL  3 mL Intravenous Q12H Albertine GratesFang Xu, MD   3 mL at 04/22/15 1059  . traMADol (ULTRAM) tablet 50 mg  50 mg Oral Q12H PRN Elease EtienneAnand D Hongalgi, MD      . triamcinolone cream (KENALOG) 0.1 % 1 application  1 application Topical Q12H PRN Albertine GratesFang Xu, MD      . vancomycin (VANCOCIN) IVPB 750 mg/150 ml premix  750 mg Intravenous Q12H Emi HolesJennifer S Markle, RPH   750 mg at 04/22/15 0510   Do not use this list as official medication orders. Please verify with discharge summary.  Discharge Medications:   Medication List    ASK your doctor about these medications        amLODipine 5 MG tablet  Commonly known as:  NORVASC  Take 5 mg by mouth every morning.     atorvastatin 20 MG tablet  Commonly known as:  LIPITOR  Take 20 mg by mouth daily.     cetaphil cream  Apply 1 application topically 2 (two) times daily as needed (for dry skin areas).     cholecalciferol 400 UNITS Tabs tablet  Commonly known as:  VITAMIN D  Take 800 Units by mouth daily.     clindamycin 150 MG capsule  Commonly known as:  CLEOCIN  Take 1 capsule (150 mg total) by mouth 4 (four) times daily.     dexlansoprazole 60 MG capsule  Commonly known as:  DEXILANT  Take 60 mg by mouth every morning.     fluocinonide-emollient 0.05 % cream  Commonly known as:  LIDEX-E  Apply 1 application topically 3 (three) times daily.     losartan 50 MG tablet  Commonly known as:  COZAAR  Take 1 tablet (50 mg total) by mouth daily. At 8 am     memantine 10 MG tablet  Commonly known as:  NAMENDA  Take 10 mg by mouth 2 (two) times daily.     metFORMIN 500 MG tablet  Commonly known as:  GLUCOPHAGE  Take 500 mg by mouth at bedtime.     QUEtiapine 25 MG tablet  Commonly known as:  SEROQUEL  Take 25 mg by mouth at bedtime.     traMADol 50 MG tablet  Commonly known as:  ULTRAM  Take 50 mg by mouth every 12 (twelve) hours as needed for moderate pain.     triamcinolone cream 0.1 %  Commonly known as:  KENALOG  Apply 1 application  topically every 12 (twelve) hours as needed (for dry skin areas).     vitamin A & D ointment  Apply  1 application topically 3 (three) times daily.        Relevant Imaging Results:  Relevant Lab Results:  Recent Labs    Additional Information    Stevan Eberwein, Harrel Lemon, LCSW

## 2015-04-22 NOTE — Progress Notes (Addendum)
PROGRESS NOTE    Jonathan Richards ZOX:[id number] DOB: 01/03/1934 DOA: 04/19/2015 PCP: Florentina JennyRIPP, HENRY, MD  HPI/Brief narrative 81 year old male, non-English speaking/Vietnamese, history of probable advanced Alzheimer's dementia, HTN, stroke, type II DM, CAD, ALF resident, followed at wound care center for nonhealing right foot wound, [facility] 04/19/15 secondary to tachycardia in the 120s. In the ED, heart rate in the 120s, WBC 18.7, lactic acid 3.08, sodium 151. Limited history due to patient's nonverbal. Hospitalized for sepsis secondary to infected nonhealing right foot wound. Vascular surgery [facility] and plan right AKA on 04/24/15.   Assessment/Plan:  Sepsis - Present on admission. Had sinus tachycardia, leukocytosis and lactic acidosis on arrival. - Due to infected chronic nonhealing ulcer of right foot - Chest x-ray without acute findings. Urine microscopy negative. Blood cultures 2: Negative to date. Urine culture shows insignificant growth. - Treating empirically with IV vancomycin and Zosyn-may consider discontinuing 24 hours after right AKA.  Infected chronic nonhealing right foot ulcers - Complicating underlying PAD - As per chart review, nonambulatory due to previous CVA and chronic right hemiparesis. - As per vascular surgery input, ulceration and right foot is not a salvageable situation due to extensive vascular disease. They recommend AKA for pain control and have discussed with patient's family who agree. - Surgery tentatively planned for 11/7. Patient is at moderate risk for perioperative cardiac events and may proceed with surgery without any further workup. Continue perioperative beta blockers.  Acute encephalopathy - Secondary to sepsis and electrolyte abnormality/hyper natremia complicating advanced dementia. Reduced dose of Seroquel. - Said to be improving.  Dehydration with hypernatremia - Sodium has gradually  improved to 148. Continue hypotonic IV fluids and follow BMP in a.m.  Hypokalemia - Replaced.   Essential hypertension - Controlled. Continue metoprolol. Norvasc and losartan held  Hyperlipidemia - Statins  Type II DM - Reasonable inpatient control. Holding metformin. Continue SSI.  CAD status post CABG 2002 - No report of chest pain. EKG without acute changes.  Dysphagia - Speech therapy input appreciated and continue dysphagia 1 diet.     DVT prophylaxis: Lovenox  Code Status: Full Family Communication: None at bedside  Disposition Plan: May need SNF when medically stable for DC.   Consultants:  Vascular surgery   Procedures:  None   Antibiotics:  IV vancomycin 11/2 >   IV Zosyn 11/2 >  Subjective: Patient nonverbal. As per nursing, no acute events.   Objective: Filed Vitals:   04/21/15 2113 04/22/15 0541 04/22/15 0600 04/22/15 1356  BP: 136/85 130/52  131/50  Pulse: 90 85  79  Temp:   98.4 F (36.9 C) 99.6 F (37.6 C)  TempSrc:   Oral Oral  Resp: 18 26  16   Height:      Weight:      SpO2:   100%     Intake/Output Summary (Last 24 hours) at 04/22/15 1438 Last data filed at 04/22/15 0833  Gross per 24 hour  Intake 2096.67 ml  Output   1300 ml  Net 796.67 ml   Filed Weights   04/20/15 0400 04/20/15 2028 04/21/15 0600  Weight: 52 kg (114 lb 10.2 oz) 53.4 kg (117 lb 11.6 oz) 53.2 kg (117 lb 4.6 oz)     Exam:  General exam: Chronically ill-looking elderly male lying comfortably supine in bed.  Respiratory system: Clear/Poor inspiratory effort . No increased work of breathing. Cardiovascular system: S1 & S2 heard, RRR. No JVD, murmurs, gallops,  clicks or pedal edema. Telemetry: Sinus rhythm  Gastrointestinal system: Abdomen is nondistended, soft and nontender. Normal bowel sounds heard. Central nervous system: Somnolent but easily arousable, opens eyes but nonverbal and does not follow instructions. No focal neurological  deficits. Extremities: spontaneously moves all limbs. Right foot dressing clean and dry.   Data Reviewed: Basic Metabolic Panel:  Recent Labs Lab 04/19/15 1445 04/20/15 0340 04/21/15 1107 04/22/15 0450  NA 151* 153* 154* 148*  K 3.8 2.9* 4.3 4.0  CL 113* 117* 124* 117*  CO2 21*  GLUCOSE 184* 133* 118* 128*  BUN 27* CREATININE 1.19 1.03 1.25* 1.03  CALCIUM 10.2 9.2 8.9 8.5*  MG  --  1.8  --   --    Liver Function Tests:  Recent Labs Lab 04/19/15 1445 04/20/15 0340 04/21/15 1107 04/22/15 0450  AST 48* 46* 47* 44*  ALT 34 33 36 35  ALKPHOS 132* 122 105 108  BILITOT 1.4* 1.3* 1.1 1.7*  PROT 8.8* 7.6 6.7 6.1*  ALBUMIN 3.4* 2.9* 2.2* 1.9*   No results for input(s): LIPASE, AMYLASE in the last 168 hours. No results for input(s): AMMONIA in the last 168 hours. CBC:  Recent Labs Lab 04/19/15 1445 04/20/15 0340 04/21/15 1107 04/22/15 0450  WBC 18.7* 18.5* 30.1* 24.6*  NEUTROABS 13.3*  --   --   --   HGB 15.8 14.4 13.2 12.4*  HCT 48.7 44.9 41.7 38.6*  MCV 93.5 93.7 93.5 92.3  PLT 409* 384 297 239   Cardiac Enzymes: No results for input(s): CKTOTAL, CKMB, CKMBINDEX, TROPONINI in the last 168 hours. BNP (last 3 results) No results for input(s): PROBNP in the last 8760 hours. CBG:  Recent Labs Lab 04/21/15 1219 04/21/15 1700 04/21/15 2204 04/22/15 0832 04/22/15 1213  GLUCAP 114* 191* 183* 148* 140*    Recent Results (from the past 240 hour(s))  Culture, blood (routine x 2)     Status: None (Preliminary result)   Collection Time: 04/19/15  2:45 PM  Result Value Ref Range Status   Specimen Description BLOOD LEFT WRIST  Final   Special Requests BOTTLES DRAWN AEROBIC AND ANAEROBIC  Final   Culture   Final    NO GROWTH 3 DAYS Performed at [facility]    Report Status PENDING  Incomplete  Culture, blood (routine x 2)     Status: None (Preliminary result)   Collection Time: 04/19/15  2:58 PM  Result Value Ref Range Status    Specimen Description BLOOD RIGHT ANTECUBITAL  Final   Special Requests BOTTLES DRAWN AEROBIC ONLY  Final   Culture   Final    NO GROWTH 3 DAYS Performed at [facility]    Report Status PENDING  Incomplete  MRSA PCR Screening     Status: Abnormal   Collection Time: 04/19/15  7:30 PM  Result Value Ref Range Status   MRSA by PCR POSITIVE (A) NEGATIVE Final    Comment:        The GeneXpert MRSA Assay (FDA approved for NASAL specimens only), is one component of a comprehensive MRSA colonization surveillance program. It is not intended to diagnose MRSA infection nor to guide or monitor treatment for MRSA infections. RESULT CALLED TO, READ BACK BY AND VERIFIED WITH: CROFTS,S RN [phone] [id number] COVINGTON,N   Urine culture     Status: None   Collection Time: 04/19/15  9:57 PM  Result Value Ref Range Status   Specimen Description URINE, CATHETERIZED  Final   Special Requests NONE  Final   Culture   Final    3,000 COLONIES/mL INSIGNIFICANT GROWTH Performed at [facility]    Report Status 04/21/2015 FINAL  Final           Studies: No results found.      Scheduled Meds: . atorvastatin  20 mg Oral Daily  . Chlorhexidine Gluconate Cloth  6 each Topical Q0600  . collagenase   Topical Daily  . enoxaparin (LOVENOX) injection  40 mg Subcutaneous Q24H  . feeding supplement (GLUCERNA SHAKE)  237 mL Oral TID BM  . feeding supplement (PRO-STAT SUGAR FREE 64)  30 mL Oral BID  . fluocinonide-emollient  1 application Topical TID  . insulin aspart  0-15 Units Subcutaneous TID WC  . memantine  10 mg Oral BID  . metoprolol tartrate  25 mg Oral BID  . mupirocin ointment  1 application Nasal BID  . piperacillin-tazobactam (ZOSYN)  IV  3.375 g Intravenous 3 times per day  . QUEtiapine  25 mg Oral QHS  . sodium chloride  3 mL Intravenous Q12H  . vancomycin  750 mg Intravenous Q12H   Continuous Infusions: . 0.45 % NaCl with KCl 20 mEq / L 100 mL/hr at 04/22/15 1058     Active Problems:   Sepsis (HCC)   Pressure ulcer   Protein-calorie malnutrition, severe    Time spent: 45 minutes.    Marcellus Scott, MD, FACP, FHM. [facility] Pager [phone]  If 7PM-7AM, please contact night-coverage [email or web address] Password [email or web address] 04/22/2015, 2:38 PM    LOS: 3 days

## 2015-04-23 LAB — CBC
HEMATOCRIT: 34.9 % — AB (ref 39.0–52.0)
HEMOGLOBIN: 11.2 g/dL — AB (ref 13.0–17.0)
MCH: 29.2 pg (ref 26.0–34.0)
MCHC: 32.1 g/dL (ref 30.0–36.0)
MCV: 90.9 fL (ref 78.0–100.0)
PLATELETS: 208 10*3/uL (ref 150–400)
RBC: 3.84 MIL/uL — AB (ref 4.22–5.81)
RDW: 13.9 % (ref 11.5–15.5)
WBC: 18 10*3/uL — ABNORMAL HIGH (ref 4.0–10.5)

## 2015-04-23 LAB — BASIC METABOLIC PANEL
Anion gap: 9 (ref 5–15)
BUN: 13 mg/dL (ref 6–20)
CHLORIDE: 105 mmol/L (ref 101–111)
CO2: 23 mmol/L (ref 22–32)
CREATININE: 1.03 mg/dL (ref 0.61–1.24)
Calcium: 7.8 mg/dL — ABNORMAL LOW (ref 8.9–10.3)
Glucose, Bld: 172 mg/dL — ABNORMAL HIGH (ref 65–99)
POTASSIUM: 3 mmol/L — AB (ref 3.5–5.1)
SODIUM: 137 mmol/L (ref 135–145)

## 2015-04-23 LAB — GLUCOSE, CAPILLARY
GLUCOSE-CAPILLARY: 118 mg/dL — AB (ref 65–99)
Glucose-Capillary: 175 mg/dL — ABNORMAL HIGH (ref 65–99)
Glucose-Capillary: 147 mg/dL — ABNORMAL HIGH (ref 65–99)
Glucose-Capillary: 116 mg/dL — ABNORMAL HIGH (ref 65–99)

## 2015-04-23 MED ORDER — SODIUM CHLORIDE 0.9 % IV SOLN
INTRAVENOUS | Status: DC
Start: 2015-04-23 — End: 2015-04-24
  Administered 2015-04-23: 50 mL via INTRAVENOUS

## 2015-04-23 MED ORDER — POTASSIUM CHLORIDE 20 MEQ/15ML (10%) PO SOLN
40.0000 meq | Freq: Once | ORAL | Status: AC
  Administered 2015-04-23: 40 meq via ORAL
  Filled 2015-04-23: qty 30

## 2015-04-23 NOTE — Progress Notes (Addendum)
       Consent has been sign: Plan for right above-knee amputation on Monday, 04/24/2015   COLLINS, EMMA MAUREEN PA-C   

## 2015-04-23 NOTE — Progress Notes (Signed)
PROGRESS NOTE    Jonathan Richards NFA:[id number] DOB: [birth year] DOA: 04/19/2015 PCP: Florentina Jenny, MD  HPI/Brief narrative 81 year old male, non-English speaking/Vietnamese, history of probable advanced Alzheimer's dementia, HTN, stroke, type II DM, CAD, ALF resident, followed at wound care center for nonhealing right foot wound, sent to [facility] ED on 04/19/15 secondary to tachycardia in the 120s. In the ED, heart rate in the 120s, WBC 18.7, lactic acid 3.08, sodium 151. Limited history due to patient's nonverbal. Hospitalized for sepsis secondary to infected nonhealing right foot wound. Vascular surgery recommended transferring patient to [facility] and plan right AKA on 04/24/15.   Assessment/Plan:  Sepsis - Present on admission. Had sinus tachycardia, leukocytosis and lactic acidosis on arrival. - Due to infected chronic nonhealing ulcer of right foot - Chest x-ray without acute findings. Urine microscopy negative. Blood cultures 2: Negative to date. Urine culture shows insignificant growth. - Treating empirically with IV vancomycin and Zosyn-may consider discontinuing 24 hours after right AKA. - Blood cultures negative to date.  Infected chronic nonhealing right foot ulcers - Complicating underlying PAD - As per chart review, nonambulatory due to previous CVA and chronic right hemiparesis. - As per vascular surgery input, ulceration and right foot is not a salvageable situation due to extensive vascular disease. They recommend AKA for pain control and have discussed with patient's family who agree. - Surgery tentatively planned for 11/7. Patient is at moderate risk for perioperative cardiac events and may proceed with surgery without any further workup. Continue perioperative beta blockers. - As per family's discussion with patient's RN on 11/6, family would like to have the amputated lower extremity to take home (cultural/religious reasons)-same was related to vascular  surgery.  Acute encephalopathy - Secondary to sepsis and electrolyte abnormality/hyper natremia complicating advanced dementia. Reduced dose of Seroquel. - Said to be improving.  Dehydration with hypernatremia - Hypernatremia resolved. Continue gentle IV fluids. Poor oral intake as per nursing report.  Hypokalemia - Replace & follow.   Essential hypertension - Controlled. Continue metoprolol. Norvasc and losartan held  Hyperlipidemia - Statins  Type II DM - Fluctuating inpatient control. Holding metformin. Continue SSI.  CAD status post CABG 2002 - No report of chest pain. EKG without acute changes.  Dysphagia - Speech therapy input appreciated and continue dysphagia 1 diet. As per nursing, eat some with family.  Anemia - May be dilutional versus related to acute illness. Follow CBC in a.m.    DVT prophylaxis: Lovenox  Code Status: Full Family Communication: None at bedside  Disposition Plan: May need SNF when medically stable for DC.   Consultants:  Vascular surgery   Procedures:  None   Antibiotics:  IV vancomycin 11/2 >   IV Zosyn 11/2 >  Subjective: Patient nonverbal. As per nursing, no acute events.    Objective: Filed Vitals:   04/22/15 2124 04/22/15 2126 04/22/15 2233 04/23/15 0603  BP:  144/64 114/57 127/57  Pulse: 87 87 81 79  Temp:  98.7 F (37.1 C) 100 F (37.8 C) 99 F (37.2 C)  TempSrc:  Oral Oral Oral  Resp:  Height:      Weight:      SpO2: 99% 98% 97% 98%    Intake/Output Summary (Last 24 hours) at 04/23/15 1201 Last data filed at 04/23/15 1100  Gross per 24 hour  Intake    150 ml  Output   1000 ml  Net   -850 ml   Filed Weights   04/20/15  0400 04/20/15 2028 04/21/15 0600  Weight: 52 kg (114 lb 10.2 oz) 53.4 kg (117 lb 11.6 oz) 53.2 kg (117 lb 4.6 oz)     Exam:  General exam: Chronically ill-looking elderly male lying comfortably supine in bed.  Respiratory system: Clear/Poor inspiratory effort . No  increased work of breathing. Cardiovascular system: S1 & S2 heard, RRR. No JVD, murmurs, gallops, clicks or pedal edema.  Gastrointestinal system: Abdomen is nondistended, soft and nontender. Normal bowel sounds heard. Central nervous system: Alert and tracks with eyes but nonverbal and does not follow instructions. No focal neurological deficits. Extremities: spontaneously moves all limbs except right lower extremity-contractures. Right foot dressing clean and dry.   Data Reviewed: Basic Metabolic Panel:  Recent Labs Lab 04/19/15 1445 04/20/15 0340 04/21/15 1107 04/22/15 0450 04/23/15 0320  NA 151* 153* 154* 148* 137  K 3.8 2.9* 4.3 4.0 3.0*  CL 113* 117* 124* 117* 105  CO2 26 27 22  21* 23  GLUCOSE 184* 133* 118* 128* 172*  BUN 27* 19 12 15 13   CREATININE 1.19 1.03 1.25* 1.03 1.03  CALCIUM 10.2 9.2 8.9 8.5* 7.8*  MG  --  1.8  --   --   --    Liver Function Tests:  Recent Labs Lab 04/19/15 1445 04/20/15 0340 04/21/15 1107 04/22/15 0450  AST 48* 46* 47* 44*  ALT 34 33 36 35  ALKPHOS 132* 122 105 108  BILITOT 1.4* 1.3* 1.1 1.7*  PROT 8.8* 7.6 6.7 6.1*  ALBUMIN 3.4* 2.9* 2.2* 1.9*   No results for input(s): LIPASE, AMYLASE in the last 168 hours. No results for input(s): AMMONIA in the last 168 hours. CBC:  Recent Labs Lab 04/19/15 1445 04/20/15 0340 04/21/15 1107 04/22/15 0450 04/23/15 0320  WBC 18.7* 18.5* 30.1* 24.6* 18.0*  NEUTROABS 13.3*  --   --   --   --   HGB 15.8 14.4 13.2 12.4* 11.2*  HCT 48.7 44.9 41.7 38.6* 34.9*  MCV 93.5 93.7 93.5 92.3 90.9  PLT 409* 384 297 239 208   Cardiac Enzymes: No results for input(s): CKTOTAL, CKMB, CKMBINDEX, TROPONINI in the last 168 hours. BNP (last 3 results) No results for input(s): PROBNP in the last 8760 hours. CBG:  Recent Labs Lab 04/22/15 0832 04/22/15 1213 04/22/15 1656 04/22/15 2236 04/23/15 0811  GLUCAP 148* 140* 172* 254* 175*    Recent Results (from the past 240 hour(s))  Culture, blood  (routine x 2)     Status: None (Preliminary result)   Collection Time: 04/19/15  2:45 PM  Result Value Ref Range Status   Specimen Description BLOOD LEFT WRIST  Final   Special Requests BOTTLES DRAWN AEROBIC AND ANAEROBIC 5ML  Final   Culture   Final    NO GROWTH 4 DAYS Performed at [facility]    Report Status PENDING  Incomplete  Culture, blood (routine x 2)     Status: None (Preliminary result)   Collection Time: 04/19/15  2:58 PM  Result Value Ref Range Status   Specimen Description BLOOD RIGHT ANTECUBITAL  Final   Special Requests BOTTLES DRAWN AEROBIC ONLY 8ML  Final   Culture   Final    NO GROWTH 4 DAYS Performed at [facility]    Report Status PENDING  Incomplete  MRSA PCR Screening     Status: Abnormal   Collection Time: 04/19/15  7:30 PM  Result Value Ref Range Status   MRSA by PCR POSITIVE (A) NEGATIVE Final    Comment:  The GeneXpert MRSA Assay (FDA approved for NASAL specimens only), is one component of a comprehensive MRSA colonization surveillance program. It is not intended to diagnose MRSA infection nor to guide or monitor treatment for MRSA infections. RESULT CALLED TO, READ BACK BY AND VERIFIED WITH: CROFTS,S RN [phone] [id number] COVINGTON,N   Urine culture     Status: None   Collection Time: 04/19/15  9:57 PM  Result Value Ref Range Status   Specimen Description URINE, CATHETERIZED  Final   Special Requests NONE  Final   Culture   Final    3,000 COLONIES/mL INSIGNIFICANT GROWTH Performed at [facility]    Report Status 04/21/2015 FINAL  Final           Studies: No results found.      Scheduled Meds: . atorvastatin  20 mg Oral Daily  . Chlorhexidine Gluconate Cloth  6 each Topical Q0600  . collagenase   Topical Daily  . enoxaparin (LOVENOX) injection  40 mg Subcutaneous Q24H  . feeding supplement (GLUCERNA SHAKE)  237 mL Oral TID BM  . feeding supplement (PRO-STAT SUGAR FREE 64)  30 mL Oral BID  .  fluocinonide-emollient  1 application Topical TID  . insulin aspart  0-15 Units Subcutaneous TID WC  . memantine  10 mg Oral BID  . metoprolol tartrate  25 mg Oral BID  . mupirocin ointment  1 application Nasal BID  . piperacillin-tazobactam (ZOSYN)  IV  3.375 g Intravenous 3 times per day  . QUEtiapine  12.5 mg Oral QHS  . sodium chloride  3 mL Intravenous Q12H  . vancomycin  750 mg Intravenous Q12H   Continuous Infusions: . sodium chloride 50 mL (04/23/15 0820)    Active Problems:   Sepsis (HCC)   Pressure ulcer   Protein-calorie malnutrition, severe    Time spent: 15 minutes.    Marcellus Scott, MD, FACP, FHM. [facility] Pager [phone]  If 7PM-7AM, please contact night-coverage [email or web address] Password [email or web address] 04/23/2015, 12:01 PM    LOS: 4 days

## 2015-04-24 ENCOUNTER — Encounter (HOSPITAL_COMMUNITY)
Admission: EM | Disposition: A | Discharge status: Skilled Nursing Facility | Payer: Self-pay | Source: Home / Self Care | Attending: Internal Medicine

## 2015-04-24 ENCOUNTER — Inpatient Hospital Stay (HOSPITAL_COMMUNITY): Payer: Medicare Other | Admitting: Certified Registered Nurse Anesthetist

## 2015-04-24 ENCOUNTER — Encounter (HOSPITAL_COMMUNITY): Payer: Self-pay | Admitting: Certified Registered Nurse Anesthetist

## 2015-04-24 DIAGNOSIS — Z89611 Acquired absence of right leg above knee: Secondary | ICD-10-CM

## 2015-04-24 HISTORY — PX: AMPUTATION: SHX166

## 2015-04-24 LAB — CBC
HCT: 37.3 % — ABNORMAL LOW (ref 39.0–52.0)
HEMOGLOBIN: 12 g/dL — AB (ref 13.0–17.0)
MCH: 29 pg (ref 26.0–34.0)
MCHC: 32.2 g/dL (ref 30.0–36.0)
MCV: 90.1 fL (ref 78.0–100.0)
PLATELETS: 265 10*3/uL (ref 150–400)
RBC: 4.14 MIL/uL — AB (ref 4.22–5.81)
RDW: 13.7 % (ref 11.5–15.5)
WBC: 15.5 10*3/uL — ABNORMAL HIGH (ref 4.0–10.5)

## 2015-04-24 LAB — BASIC METABOLIC PANEL
Anion gap: 8 (ref 5–15)
BUN: 11 mg/dL (ref 6–20)
CHLORIDE: 103 mmol/L (ref 101–111)
CO2: 27 mmol/L (ref 22–32)
CREATININE: 1.03 mg/dL (ref 0.61–1.24)
Calcium: 8.3 mg/dL — ABNORMAL LOW (ref 8.9–10.3)
GFR calc non Af Amer: >60 mL/min (ref >60)
GLUCOSE: 122 mg/dL — AB (ref 65–99)
Potassium: 3.1 mmol/L — ABNORMAL LOW (ref 3.5–5.1)
Sodium: 138 mmol/L (ref 135–145)

## 2015-04-24 LAB — MAGNESIUM: MAGNESIUM: 1.6 mg/dL — AB (ref 1.7–2.4)

## 2015-04-24 LAB — PROTIME-INR
INR: 1.32 (ref 0.00–1.49)
PROTHROMBIN TIME: 16.5 s — AB (ref 11.6–15.2)

## 2015-04-24 LAB — GLUCOSE, CAPILLARY
GLUCOSE-CAPILLARY: 131 mg/dL — AB (ref 65–99)
GLUCOSE-CAPILLARY: 118 mg/dL — AB (ref 65–99)
Glucose-Capillary: 106 mg/dL — ABNORMAL HIGH (ref 65–99)
Glucose-Capillary: 136 mg/dL — ABNORMAL HIGH (ref 65–99)
Glucose-Capillary: 149 mg/dL — ABNORMAL HIGH (ref 65–99)
Glucose-Capillary: 156 mg/dL — ABNORMAL HIGH (ref 65–99)

## 2015-04-24 LAB — SURGICAL PCR SCREEN
MRSA, PCR: POSITIVE — AB
Staphylococcus aureus: POSITIVE — AB

## 2015-04-24 LAB — CULTURE, BLOOD (ROUTINE X 2)
CULTURE: NO GROWTH
Culture: NO GROWTH

## 2015-04-24 SURGERY — AMPUTATION, ABOVE KNEE
Anesthesia: General | Site: Leg Upper | Laterality: Right

## 2015-04-24 MED ORDER — PROPOFOL 10 MG/ML IV BOLUS
INTRAVENOUS | Status: DC | PRN
  Administered 2015-04-24: 100 mg via INTRAVENOUS
  Administered 2015-04-24: 60 mg via INTRAVENOUS

## 2015-04-24 MED ORDER — LIDOCAINE HCL (CARDIAC) 20 MG/ML IV SOLN
INTRAVENOUS | Status: AC
  Filled 2015-04-24: qty 5

## 2015-04-24 MED ORDER — ROCURONIUM BROMIDE 50 MG/5ML IV SOLN
INTRAVENOUS | Status: AC
  Filled 2015-04-24: qty 1

## 2015-04-24 MED ORDER — PROPOFOL 10 MG/ML IV BOLUS
INTRAVENOUS | Status: AC
  Filled 2015-04-24: qty 20

## 2015-04-24 MED ORDER — MIDAZOLAM HCL 2 MG/2ML IJ SOLN
INTRAMUSCULAR | Status: AC
  Filled 2015-04-24: qty 4

## 2015-04-24 MED ORDER — POTASSIUM CHLORIDE 10 MEQ/100ML IV SOLN
10.0000 meq | Freq: Once | INTRAVENOUS | Status: AC
  Administered 2015-04-24: 10 meq via INTRAVENOUS
  Filled 2015-04-24: qty 100

## 2015-04-24 MED ORDER — ONDANSETRON HCL 4 MG/2ML IJ SOLN
INTRAMUSCULAR | Status: AC
  Filled 2015-04-24: qty 2

## 2015-04-24 MED ORDER — LACTATED RINGERS IV SOLN
INTRAVENOUS | Status: DC | PRN
  Administered 2015-04-24 (×2): via INTRAVENOUS

## 2015-04-24 MED ORDER — SUCCINYLCHOLINE CHLORIDE 20 MG/ML IJ SOLN
INTRAMUSCULAR | Status: AC
  Filled 2015-04-24: qty 1

## 2015-04-24 MED ORDER — EPHEDRINE SULFATE 50 MG/ML IJ SOLN
INTRAMUSCULAR | Status: AC
  Filled 2015-04-24: qty 1

## 2015-04-24 MED ORDER — FENTANYL CITRATE (PF) 100 MCG/2ML IJ SOLN
INTRAMUSCULAR | Status: DC | PRN
  Administered 2015-04-24: 50 ug via INTRAVENOUS
  Administered 2015-04-24 (×2): 100 ug via INTRAVENOUS

## 2015-04-24 MED ORDER — PHENYLEPHRINE HCL 10 MG/ML IJ SOLN
INTRAMUSCULAR | Status: DC | PRN
  Administered 2015-04-24 (×8): 80 ug via INTRAVENOUS
  Administered 2015-04-24: 120 ug via INTRAVENOUS
  Administered 2015-04-24 (×3): 80 ug via INTRAVENOUS
  Administered 2015-04-24: 40 ug via INTRAVENOUS
  Administered 2015-04-24 (×2): 80 ug via INTRAVENOUS

## 2015-04-24 MED ORDER — MAGNESIUM SULFATE IN D5W 10-5 MG/ML-% IV SOLN
1.0000 g | Freq: Once | INTRAVENOUS | Status: AC
Start: 2015-04-24 — End: 2015-04-24
  Administered 2015-04-24: 1 g via INTRAVENOUS
  Filled 2015-04-24 (×2): qty 100

## 2015-04-24 MED ORDER — STERILE WATER FOR INJECTION IJ SOLN
INTRAMUSCULAR | Status: AC
  Filled 2015-04-24: qty 10

## 2015-04-24 MED ORDER — POTASSIUM CHLORIDE CRYS ER 20 MEQ PO TBCR
40.0000 meq | EXTENDED_RELEASE_TABLET | Freq: Once | ORAL | Status: AC
  Administered 2015-04-24: 40 meq via ORAL
  Filled 2015-04-24: qty 2

## 2015-04-24 MED ORDER — EPHEDRINE SULFATE 50 MG/ML IJ SOLN
INTRAMUSCULAR | Status: DC | PRN
Start: 2015-04-24 — End: 2015-04-24
  Administered 2015-04-24 (×2): 5 mg via INTRAVENOUS

## 2015-04-24 MED ORDER — ONDANSETRON HCL 4 MG/2ML IJ SOLN
INTRAMUSCULAR | Status: DC | PRN
  Administered 2015-04-24: 4 mg via INTRAVENOUS

## 2015-04-24 MED ORDER — POTASSIUM CHLORIDE 10 MEQ/100ML IV SOLN
10.0000 meq | INTRAVENOUS | Status: AC
  Administered 2015-04-24: 10 meq via INTRAVENOUS
  Filled 2015-04-24: qty 100

## 2015-04-24 MED ORDER — LACTATED RINGERS IV SOLN
INTRAVENOUS | Status: DC
  Administered 2015-04-24: 07:00:00 via INTRAVENOUS

## 2015-04-24 MED ORDER — 0.9 % SODIUM CHLORIDE (POUR BTL) OPTIME
TOPICAL | Status: DC | PRN
  Administered 2015-04-24 (×2): 1000 mL

## 2015-04-24 MED ORDER — PHENYLEPHRINE 40 MCG/ML (10ML) SYRINGE FOR IV PUSH (FOR BLOOD PRESSURE SUPPORT)
PREFILLED_SYRINGE | INTRAVENOUS | Status: AC
  Filled 2015-04-24: qty 20

## 2015-04-24 MED ORDER — FENTANYL CITRATE (PF) 250 MCG/5ML IJ SOLN
INTRAMUSCULAR | Status: AC
  Filled 2015-04-24: qty 5

## 2015-04-24 MED ORDER — LIDOCAINE HCL (CARDIAC) 20 MG/ML IV SOLN
INTRAVENOUS | Status: DC | PRN
  Administered 2015-04-24: 60 mg via INTRAVENOUS

## 2015-04-24 MED ORDER — SUCCINYLCHOLINE CHLORIDE 20 MG/ML IJ SOLN
INTRAMUSCULAR | Status: DC | PRN
  Administered 2015-04-24: 40 mg via INTRAVENOUS

## 2015-04-24 MED ORDER — POTASSIUM CHLORIDE 10 MEQ/100ML IV SOLN
10.0000 meq | INTRAVENOUS | Status: AC
  Administered 2015-04-24 (×2): 10 meq via INTRAVENOUS
  Filled 2015-04-24 (×2): qty 100

## 2015-04-24 SURGICAL SUPPLY — 42 items
BANDAGE ELASTIC 6 VELCRO ST LF (GAUZE/BANDAGES/DRESSINGS) ×2 IMPLANT
BLADE SAW RECIP 87.9 MT (BLADE) ×2 IMPLANT
BNDG COHESIVE 4X5 TAN STRL (GAUZE/BANDAGES/DRESSINGS) ×2 IMPLANT
BNDG COHESIVE 6X5 TAN STRL LF (GAUZE/BANDAGES/DRESSINGS) ×2 IMPLANT
BNDG GAUZE ELAST 4 BULKY (GAUZE/BANDAGES/DRESSINGS) ×2 IMPLANT
CANISTER SUCTION 2500CC (MISCELLANEOUS) ×2 IMPLANT
CLIP TI MEDIUM 6 (CLIP) IMPLANT
COVER SURGICAL LIGHT HANDLE (MISCELLANEOUS) ×2 IMPLANT
COVER TABLE BACK 60X90 (DRAPES) ×2 IMPLANT
DRAIN CHANNEL 19F RND (DRAIN) IMPLANT
DRAPE ORTHO SPLIT 77X108 STRL (DRAPES) ×2
DRAPE PROXIMA HALF (DRAPES) ×2 IMPLANT
DRAPE SURG ORHT 6 SPLT 77X108 (DRAPES) ×2 IMPLANT
DRSG ADAPTIC 3X8 NADH LF (GAUZE/BANDAGES/DRESSINGS) ×2 IMPLANT
ELECT REM PT RETURN 9FT ADLT (ELECTROSURGICAL) ×2
ELECTRODE REM PT RTRN 9FT ADLT (ELECTROSURGICAL) ×1 IMPLANT
EVACUATOR SILICONE 100CC (DRAIN) IMPLANT
GAUZE SPONGE 4X4 12PLY STRL (GAUZE/BANDAGES/DRESSINGS) ×2 IMPLANT
GLOVE BIO SURGEON STRL SZ7.5 (GLOVE) ×2 IMPLANT
GLOVE BIOGEL PI IND STRL 7.5 (GLOVE) ×1 IMPLANT
GLOVE BIOGEL PI INDICATOR 7.5 (GLOVE) ×1
GLOVE ECLIPSE 7.0 STRL STRAW (GLOVE) ×2 IMPLANT
GOWN STRL REUS W/ TWL LRG LVL3 (GOWN DISPOSABLE) ×3 IMPLANT
GOWN STRL REUS W/TWL LRG LVL3 (GOWN DISPOSABLE) ×3
KIT BASIN OR (CUSTOM PROCEDURE TRAY) ×2 IMPLANT
KIT ROOM TURNOVER OR (KITS) ×2 IMPLANT
NS IRRIG 1000ML POUR BTL (IV SOLUTION) ×2 IMPLANT
PACK GENERAL/GYN (CUSTOM PROCEDURE TRAY) ×2 IMPLANT
PAD ARMBOARD 7.5X6 YLW CONV (MISCELLANEOUS) ×4 IMPLANT
SPONGE GAUZE 4X4 12PLY STER LF (GAUZE/BANDAGES/DRESSINGS) ×2 IMPLANT
STAPLER VISISTAT 35W (STAPLE) ×2 IMPLANT
STOCKINETTE IMPERVIOUS LG (DRAPES) ×2 IMPLANT
SUT ETHILON 3 0 PS 1 (SUTURE) IMPLANT
SUT SILK 2 0 SH (SUTURE) IMPLANT
SUT SILK 2 0 SH CR/8 (SUTURE) ×2 IMPLANT
SUT SILK 2 0 TIES 10X30 (SUTURE) ×2 IMPLANT
SUT VIC AB 2-0 CT1 18 (SUTURE) ×4 IMPLANT
SUT VIC AB 2-0 SH 18 (SUTURE) IMPLANT
SUT VIC AB 3-0 SH 27 (SUTURE) ×2
SUT VIC AB 3-0 SH 27X BRD (SUTURE) ×2 IMPLANT
UNDERPAD 30X30 INCONTINENT (UNDERPADS AND DIAPERS) ×2 IMPLANT
WATER STERILE IRR 1000ML POUR (IV SOLUTION) ×2 IMPLANT

## 2015-04-24 NOTE — Anesthesia Procedure Notes (Signed)
Procedure Name: Intubation Date/Time: 04/24/2015 7:55 AM Performed by: Reine JustFLOWERS, Roneisha Stern T Pre-anesthesia Checklist: Patient identified, Emergency Drugs available, Suction available, Patient being monitored and Timeout performed Patient Re-evaluated:Patient Re-evaluated prior to inductionOxygen Delivery Method: Simple face mask and Circle system utilized Preoxygenation: Pre-oxygenation with 100% oxygen Intubation Type: IV induction Ventilation: Mask ventilation without difficulty Laryngoscope Size: Miller and 3 Grade View: Grade I Tube type: Oral Tube size: 7.5 mm Number of attempts: 1 Airway Equipment and Method: Patient positioned with wedge pillow and Stylet Placement Confirmation: ETT inserted through vocal cords under direct vision,  positive ETCO2 and breath sounds checked- equal and bilateral Secured at: 22 cm Tube secured with: Tape Dental Injury: Teeth and Oropharynx as per pre-operative assessment

## 2015-04-24 NOTE — Interval H&P Note (Signed)
History and Physical Interval Note:  04/24/2015 7:35 AM  Su Hoffai V Poyser  has presented today for surgery, with the diagnosis of Nonhealing ulcers right foot L97.409  The various methods of treatment have been discussed with the patient and family. After consideration of risks, benefits and other options for treatment, the patient has consented to  Procedure(s): AMPUTATION ABOVE KNEE (Right) as a surgical intervention .  The patient's history has been reviewed, patient examined, no change in status, stable for surgery.  I have reviewed the patient's chart and labs.  Questions were answered to the patient's satisfaction.     Fabienne BrunsFields, Edrees Valent

## 2015-04-24 NOTE — Anesthesia Preprocedure Evaluation (Addendum)
Anesthesia Evaluation  Patient identified by MRN, date of birth, ID band Patient confused    Reviewed: Allergy & Precautions, NPO status , Patient's Chart, lab work & pertinent test results  Airway Mallampati: II  TM Distance: >3 FB Neck ROM: Full    Dental no notable dental hx. (+) Edentulous Upper, Edentulous Lower, Dental Advisory Given   Pulmonary neg pulmonary ROS,    Pulmonary exam normal breath sounds clear to auscultation       Cardiovascular hypertension, Pt. on medications + CAD  Normal cardiovascular exam Rhythm:Regular Rate:Normal     Neuro/Psych PSYCHIATRIC DISORDERS CVA negative psych ROS   GI/Hepatic negative GI ROS, Neg liver ROS,   Endo/Other  diabetes, Type 2  Renal/GU negative Renal ROS     Musculoskeletal negative musculoskeletal ROS (+)   Abdominal   Peds  Hematology negative hematology ROS (+)   Anesthesia Other Findings   Reproductive/Obstetrics negative OB ROS                           Anesthesia Physical Anesthesia Plan  ASA: III  Anesthesia Plan: General   Post-op Pain Management:    Induction: Intravenous  Airway Management Planned: Oral ETT  Additional Equipment: None  Intra-op Plan:   Post-operative Plan: Extubation in OR  Informed Consent: I have reviewed the patients History and Physical, chart, labs and discussed the procedure including the risks, benefits and alternatives for the proposed anesthesia with the patient or authorized representative who has indicated his/her understanding and acceptance.   Dental advisory given  Plan Discussed with: CRNA  Anesthesia Plan Comments:       Anesthesia Quick Evaluation

## 2015-04-24 NOTE — Progress Notes (Signed)
ANTIBIOTIC CONSULT NOTE - FOLLOW UP  Pharmacy Consult for Vancomycin and Zosyn Indication: sepsis from non-healing foot ulcers  Allergies  Allergen Reactions  . Asa [Aspirin] Other (See Comments)    Per daughter, pt is not allergic to ASA, but chart has this listed as an allergy.   Patient Measurements: Height: 5\' 7"  (170.2 cm) Weight: 117 lb 4.6 oz (53.2 kg) IBW/kg (Calculated) : 66.1  Vital Signs: Temp: 97.8 F (36.6 C) (11/07 1003) Temp Source: Oral (11/07 1003) BP: 151/69 mmHg (11/07 1301) Pulse Rate: 78 (11/07 1301) Intake/Output from previous day: 11/06 0701 - 11/07 0700 In: 250 [IV Piggyback:250] Out: 2615 [Urine:2615] Intake/Output from this shift: Total I/O In: 1240 [P.O.:240; I.V.:1000] Out: 250 [Urine:200; Blood:50]  Labs:  Recent Labs  04/22/15 0450 04/23/15 0320 04/24/15 0539  WBC 24.6* 18.0* 15.5*  HGB 12.4* 11.2* 12.0*  PLT 239 208 265  CREATININE 1.03 1.03 1.03   Estimated Creatinine Clearance: 43.8 mL/min (by C-G formula based on Cr of 1.03).  Recent Labs  04/21/15 1645  VANCOTROUGH 9*    Assessment: 81 y/o male who continues on day #6 vancomycin and Zosyn for sepsis 2/2 nonhealing ulcer of right foot. He is now s/p R AKA. Renal function remains stable, UOP is good. He is afebrile and WBC have trended down.   Vancomycin 11/2>>  Zosyn 11/2>>   11/2 bloodx2: NEG  11/2 MRSA PCR+ (CHG/Mupirocin ordered)  11/2 urine: neg  11/2 Foot Xray: negative for osteo  Goal of Therapy:  Vancomycin trough level 15-20 mcg/ml  Plan:  - Continue vancomycin to 750 mg IV q12h - Continue Zosyn 3.375 g IV q8h (4 hour infusion) - Recommend stopping antibiotics 24-48 hrs post-op  [facility]oJennifer Republic, Pharm.D., BCPS Clinical Pharmacist Pager: [phone] 04/24/2015 3:26 PM

## 2015-04-24 NOTE — Op Note (Signed)
[facility] OPERATIVE NOTE  Procedure: Right above knee amputation  Surgeon(s): Sherren Kernsharles E Fields, MD  ASSISTANT: Lianne CureMaureen Collins, PA-C  Anesthesia: General  Specimens: Right leg  PROCEDURE DETAIL: After obtaining informed consent, the patient was taken to the operating room. The patient was placed in supine position the operating room table. After induction of general anesthesia and endotracheal intubation the patient's Foley catheter was placed. Next patient's entire right lower extremity was prepped and draped in usual sterile fashion. A circumferential incision was made on the right leg just above the knee. A high above knee amputation was performed due to the patient's lack of a femoral pulse and non ambulatory status with contracture of the right hip. The incision was carried down into the sucutaneous tissues.  Soft tissues were taken down as well as the muscle and fascia with cautery. The superficial femoral artery and vein were dissected free circumferentially clamped and divided. These were suture ligated proximally. Remainder of the soft tissues were taken down with cautery. The periosteum was raised on the femur approximately 5 cm above the skin edge. The femur was divided at this level. The leg was passed off the table as a specimen. Hemostasis was obtained. The wound was thoroughly irrigated with normal saline solution. The fascial edges were reapproximated using interrupted 2 0 Vicryl sutures. The subcutaneous tissues reapproximated using a running 3-0 Vicryl suture. The skin was closed staples. Patient tolerated procedure well and there were no complications. Instrument sponge and needle counts correct in the case. Patient was taken to recovery in stable condition.  Fabienne Brunsharles Fields, MD [facility] [place] Office: [phone] Pager: [phone]

## 2015-04-24 NOTE — Care Management Note (Signed)
Case Management Note  Patient Details  Name: Su HoffDai V Klaiber MRN: [id number] Date of Birth: 09/12/1933  Subjective/Objective:   Patient is s/p AKA today, plan is for dc to snf, CSW following.                 Action/Plan:   Expected Discharge Date:   (unknown)               Expected Discharge Plan:  Skilled Nursing Facility  In-House Referral:  Clinical Social Work  Discharge planning Services  CM Consult  Post Acute Care Choice:    Choice offered to:     DME Arranged:    DME Agency:     HH Arranged:    HH Agency:     Status of Service:  Completed, signed off  Medicare Important Message Given:  Yes-second notification given Date Medicare IM Given:    Medicare IM give by:    Date Additional Medicare IM Given:    Additional Medicare Important Message give by:     If discussed at Long Length of Stay Meetings, dates discussed:    Additional Comments:  Leone Havenaylor, [place], RN 04/24/2015, 4:43 PM

## 2015-04-24 NOTE — Transfer of Care (Signed)
Immediate Anesthesia Transfer of Care Note  Patient: Jonathan Richards  Procedure(s) Performed: Procedure(s): AMPUTATION ABOVE KNEE (Right)  Patient Location: PACU  Anesthesia Type:General  Level of Consciousness: awake and alert   Airway & Oxygen Therapy: Patient Spontanous Breathing and Patient connected to face mask oxygen  Post-op Assessment: Report given to RN and Post -op Vital signs reviewed and stable  Post vital signs: Reviewed and stable  Last Vitals:  Filed Vitals:   04/24/15 0910  BP:   Pulse: 74  Temp: 36.4 C  Resp:     Complications: No apparent anesthesia complications

## 2015-04-24 NOTE — H&P (View-Only) (Signed)
       Consent has been sign: Plan for right above-knee amputation on Monday, 04/24/2015   Jacory Kamel MAUREEN PA-C

## 2015-04-24 NOTE — Progress Notes (Addendum)
Pt dtr in law chooses [facility]- facility is able to accept when pt is stable for DC and has been without restraints (mitts) for 24 hours  CSW will continue to follow.  Merlyn LotJenna Holoman, LCSWA Clinical Social Worker [phone]

## 2015-04-24 NOTE — Progress Notes (Signed)
PROGRESS NOTE    Jonathan Richards GNF:[id number] DOB: May 06, 1934 DOA: 04/19/2015 PCP: Florentina Jenny, MD  HPI/Brief narrative 81 year old male, non-English speaking/Vietnamese, history of probable advanced Alzheimer's dementia, HTN, stroke, type II DM, CAD, ALF resident, followed at wound care center for nonhealing right foot wound, sent to [facility] ED on 04/19/15 secondary to tachycardia in the 120s. In the ED, heart rate in the 120s, WBC 18.7, lactic acid 3.08, sodium 151. Limited history due to patient's nonverbal. Hospitalized for sepsis secondary to infected nonhealing right foot wound. Vascular surgery recommended transferring patient to [facility] and plan right AKA on 04/24/15. Status post right AKA on 11/7. Plan is to DC to SNF when cleared by surgery.  Assessment/Plan:  Sepsis - Present on admission. Had sinus tachycardia, leukocytosis and lactic acidosis on arrival. - Due to infected chronic nonhealing ulcer of right foot - Chest x-ray without acute findings. Urine microscopy negative. Blood cultures 2: Negative to date. Urine culture shows insignificant growth. - Treating empirically with IV vancomycin and Zosyn-may consider discontinuing 24 hours after right AKA. - Blood cultures negative to date.  Infected chronic nonhealing right foot ulcers - Complicating underlying PAD - As per chart review, nonambulatory due to previous CVA and chronic right hemiparesis. - As per vascular surgery input, ulceration and right foot is not a salvageable situation due to extensive vascular disease. They recommend AKA for pain control and have discussed with patient's family who agree. - S/P R AKA on 11/7. Mx per surgery.  Acute encephalopathy - Secondary to sepsis and electrolyte abnormality/hyper natremia complicating advanced dementia. Reduced dose of Seroquel. - Resolved and mental status at baseline as per son.  Dehydration with hypernatremia - Resolved.  Hypokalemia/hypomagnesemia -  Replace & follow.   Essential hypertension - Controlled. Continue metoprolol. Norvasc and losartan held  Hyperlipidemia - Statins  Type II DM - Reasonable inpatient control. Holding metformin. Continue SSI.  CAD status post CABG 2002 - No report of chest pain. EKG without acute changes.  Dysphagia - Speech therapy input appreciated and continue dysphagia 1 diet. As per nursing, eat some with family.  Anemia - Stable    DVT prophylaxis: Lovenox  Code Status: Full Family Communication: Discussed with son at bedside on 11/7. Disposition Plan: DC to SNF when cleared by surgery.   Consultants:  Vascular surgery   Procedures:  Right AKA 11/7.  Antibiotics:  IV vancomycin 11/2 >   IV Zosyn 11/2 >  Subjective: Patient nonverbal. As per younger son at bedside, mental status at baseline. No acute events reported. Patient was seen post surgery.  Objective: Filed Vitals:   04/24/15 1132 04/24/15 1230 04/24/15 1246 04/24/15 1301  BP: 158/58 139/63 148/61 151/69  Pulse: 79 82 74 78  Temp:      TempSrc:      Resp:      Height:      Weight:      SpO2: 100% 100% 100% 100%   temperature 97.28F, respiratory rate 22/m.  Intake/Output Summary (Last 24 hours) at 04/24/15 1525 Last data filed at 04/24/15 1300  Gross per 24 hour  Intake   1390 ml  Output   1215 ml  Net    175 ml   Filed Weights   04/20/15 0400 04/20/15 2028 04/21/15 0600  Weight: 52 kg (114 lb 10.2 oz) 53.4 kg (117 lb 11.6 oz) 53.2 kg (117 lb 4.6 oz)     Exam:  General exam: Chronically ill-looking elderly male lying comfortably supine in  bed.  Respiratory system: Clear/Poor inspiratory effort . No increased work of breathing. Cardiovascular system: S1 & S2 heard, RRR. No JVD, murmurs, gallops, clicks or pedal edema.  Gastrointestinal system: Abdomen is nondistended, soft and nontender. Normal bowel sounds heard. Central nervous system: Alert and tracks with eyes but nonverbal and does not follow  instructions. No focal neurological deficits. Extremities: spontaneously moves all limbs except right lower extremity-contractures. Right AKA site dressing clean and dry.   Data Reviewed: Basic Metabolic Panel:  Recent Labs Lab 04/20/15 0340 04/21/15 1107 04/22/15 0450 04/23/15 0320 04/24/15 0539 04/24/15 0952  NA 153* 154* 148* 137 138  --   K 2.9* 4.3 4.0 3.0* 3.1*  --   CL 117* 124* 117* 105 103  --   CO2 27 22 21* 23 27  --   GLUCOSE 133* 118* 128* 172* 122*  --   BUN --   CREATININE 1.03 1.25* 1.03 1.03 1.03  --   CALCIUM 9.2 8.9 8.5* 7.8* 8.3*  --   MG 1.8  --   --   --   --  1.6*   Liver Function Tests:  Recent Labs Lab 04/19/15 1445 04/20/15 0340 04/21/15 1107 04/22/15 0450  AST 48* 46* 47* 44*  ALT 34 33 36 35  ALKPHOS 132* 122 105 108  BILITOT 1.4* 1.3* 1.1 1.7*  PROT 8.8* 7.6 6.7 6.1*  ALBUMIN 3.4* 2.9* 2.2* 1.9*   No results for input(s): LIPASE, AMYLASE in the last 168 hours. No results for input(s): AMMONIA in the last 168 hours. CBC:  Recent Labs Lab 04/19/15 1445 04/20/15 0340 04/21/15 1107 04/22/15 0450 04/23/15 0320 04/24/15 0539  WBC 18.7* 18.5* 30.1* 24.6* 18.0* 15.5*  NEUTROABS 13.3*  --   --   --   --   --   HGB 15.8 14.4 13.2 12.4* 11.2* 12.0*  HCT 48.7 44.9 41.7 38.6* 34.9* 37.3*  MCV 93.5 93.7 93.5 92.3 90.9 90.1  PLT 409* 384 297 239 208 265   Cardiac Enzymes: No results for input(s): CKTOTAL, CKMB, CKMBINDEX, TROPONINI in the last 168 hours. BNP (last 3 results) No results for input(s): PROBNP in the last 8760 hours. CBG:  Recent Labs Lab 04/23/15 2155 04/24/15 0541 04/24/15 0913 04/24/15 1024 04/24/15 1232  GLUCAP 118* 106* 131* 136* 149*    Recent Results (from the past 240 hour(s))  Culture, blood (routine x 2)     Status: None   Collection Time: 04/19/15  2:45 PM  Result Value Ref Range Status   Specimen Description BLOOD LEFT WRIST  Final   Special Requests BOTTLES DRAWN AEROBIC AND  ANAEROBIC  Final   Culture   Final    NO GROWTH 5 DAYS Performed at [facility]    Report Status 04/24/2015 FINAL  Final  Culture, blood (routine x 2)     Status: None   Collection Time: 04/19/15  2:58 PM  Result Value Ref Range Status   Specimen Description BLOOD RIGHT ANTECUBITAL  Final   Special Requests BOTTLES DRAWN AEROBIC ONLY  Final   Culture   Final    NO GROWTH 5 DAYS Performed at [facility]    Report Status 04/24/2015 FINAL  Final  MRSA PCR Screening     Status: Abnormal   Collection Time: 04/19/15  7:30 PM  Result Value Ref Range Status   MRSA by PCR POSITIVE (A) NEGATIVE Final    Comment:  The GeneXpert MRSA Assay (FDA approved for NASAL specimens only), is one component of a comprehensive MRSA colonization surveillance program. It is not intended to diagnose MRSA infection nor to guide or monitor treatment for MRSA infections. RESULT CALLED TO, READ BACK BY AND VERIFIED WITH: CROFTS,S RN [phone] [id number] COVINGTON,N   Urine culture     Status: None   Collection Time: 04/19/15  9:57 PM  Result Value Ref Range Status   Specimen Description URINE, CATHETERIZED  Final   Special Requests NONE  Final   Culture   Final    3,000 COLONIES/mL INSIGNIFICANT GROWTH Performed at [facility]    Report Status 04/21/2015 FINAL  Final  Surgical pcr screen     Status: Abnormal   Collection Time: 04/24/15 12:21 AM  Result Value Ref Range Status   MRSA, PCR POSITIVE (A) NEGATIVE Final    Comment: RESULT CALLED TO, READ BACK BY AND VERIFIED WITH: WILEY,N RN [phone] 04/24/15 MITCHELL,L    Staphylococcus aureus POSITIVE (A) NEGATIVE Final    Comment:        The Xpert SA Assay (FDA approved for NASAL specimens in patients over 79 years of age), is one component of a comprehensive surveillance program.  Test performance has been validated by [facility] for patients greater than or equal to 591 year old. It is not intended to diagnose  infection nor to guide or monitor treatment. RESULT CALLED TO, READ BACK BY AND VERIFIED WITHGlade Nurse: WILEY,N RN [phone] 04/24/15 MITCHELL,L            Studies: No results found.      Scheduled Meds: . atorvastatin  20 mg Oral Daily  . collagenase   Topical Daily  . enoxaparin (LOVENOX) injection  40 mg Subcutaneous Q24H  . feeding supplement (GLUCERNA SHAKE)  237 mL Oral TID BM  . feeding supplement (PRO-STAT SUGAR FREE 64)  30 mL Oral BID  . fluocinonide-emollient  1 application Topical TID  . insulin aspart  0-15 Units Subcutaneous TID WC  . memantine  10 mg Oral BID  . metoprolol tartrate  25 mg Oral BID  . mupirocin ointment  1 application Nasal BID  . piperacillin-tazobactam (ZOSYN)  IV  3.375 g Intravenous 3 times per day  . QUEtiapine  12.5 mg Oral QHS  . sodium chloride  3 mL Intravenous Q12H  . vancomycin  750 mg Intravenous Q12H   Continuous Infusions: . lactated ringers 10 mL/hr at 04/24/15 [id number]    Active Problems:   Sepsis (HCC)   Pressure ulcer   Protein-calorie malnutrition, severe    Time spent: 25 minutes.    Marcellus ScottHONGALGI,ANAND, MD, FACP, FHM. [facility] Pager [phone]  If 7PM-7AM, please contact night-coverage [email or web address] Password [email or web address] 04/24/2015, 3:25 PM    LOS: 5 days

## 2015-04-24 NOTE — Anesthesia Postprocedure Evaluation (Signed)
  Anesthesia Post-op Note  Patient: Jonathan Richards  Procedure(s) Performed: Procedure(s): AMPUTATION ABOVE KNEE (Right)  Patient Location: PACU  Anesthesia Type:General  Level of Consciousness: awake and alert   Airway and Oxygen Therapy: Patient Spontanous Breathing  Post-op Pain: mild  Post-op Assessment: Post-op Vital signs reviewed LLE Motor Response: Purposeful movement   RLE Motor Response: Non-purposeful movement (contracted)        Post-op Vital Signs: Reviewed  Last Vitals:  Filed Vitals:   04/24/15 1003  BP: 131/72  Pulse: 76  Temp: 36.6 C  Resp: 22    Complications: No apparent anesthesia complications

## 2015-04-25 ENCOUNTER — Telehealth: Payer: Self-pay | Admitting: Vascular Surgery

## 2015-04-25 ENCOUNTER — Encounter (HOSPITAL_COMMUNITY): Payer: Self-pay | Admitting: Vascular Surgery

## 2015-04-25 LAB — CBC
HEMATOCRIT: 37.7 % — AB (ref 39.0–52.0)
Hemoglobin: 12.4 g/dL — ABNORMAL LOW (ref 13.0–17.0)
MCH: 29.2 pg (ref 26.0–34.0)
MCHC: 32.9 g/dL (ref 30.0–36.0)
MCV: 88.9 fL (ref 78.0–100.0)
PLATELETS: 295 10*3/uL (ref 150–400)
RBC: 4.24 MIL/uL (ref 4.22–5.81)
RDW: 13.7 % (ref 11.5–15.5)
WBC: 15.3 10*3/uL — AB (ref 4.0–10.5)

## 2015-04-25 LAB — BASIC METABOLIC PANEL
ANION GAP: 9 (ref 5–15)
BUN: 8 mg/dL (ref 6–20)
CALCIUM: 8.1 mg/dL — AB (ref 8.9–10.3)
CO2: 25 mmol/L (ref 22–32)
CREATININE: 0.88 mg/dL (ref 0.61–1.24)
Chloride: 103 mmol/L (ref 101–111)
Glucose, Bld: 145 mg/dL — ABNORMAL HIGH (ref 65–99)
Potassium: 3.7 mmol/L (ref 3.5–5.1)
SODIUM: 137 mmol/L (ref 135–145)

## 2015-04-25 LAB — GLUCOSE, CAPILLARY
GLUCOSE-CAPILLARY: 161 mg/dL — AB (ref 65–99)
GLUCOSE-CAPILLARY: 139 mg/dL — AB (ref 65–99)
Glucose-Capillary: 126 mg/dL — ABNORMAL HIGH (ref 65–99)
Glucose-Capillary: 218 mg/dL — ABNORMAL HIGH (ref 65–99)

## 2015-04-25 LAB — MAGNESIUM: MAGNESIUM: 1.9 mg/dL (ref 1.7–2.4)

## 2015-04-25 MED ORDER — AMLODIPINE BESYLATE 5 MG PO TABS
5.0000 mg | ORAL_TABLET | Freq: Every morning | ORAL | Status: DC
  Administered 2015-04-26: 5 mg via ORAL
  Filled 2015-04-25: qty 1

## 2015-04-25 MED ORDER — CHOLECALCIFEROL 10 MCG (400 UNIT) PO TABS
800.0000 [IU] | ORAL_TABLET | Freq: Every day | ORAL | Status: DC
  Filled 2015-04-25 (×2): qty 2

## 2015-04-25 MED ORDER — PRO-STAT SUGAR FREE PO LIQD
30.0000 mL | Freq: Three times a day (TID) | ORAL | Status: DC
  Administered 2015-04-25 – 2015-04-26 (×2): 30 mL via ORAL
  Filled 2015-04-25 (×2): qty 30

## 2015-04-25 NOTE — Care Management Important Message (Signed)
Important Message  Patient Details  Name: Jonathan Richards MRN: [id number] Date of Birth: 12/26/1933   Medicare Important Message Given:  Yes-third notification given    Kyla BalzarineShealy, Carolee Channell Abena 04/25/2015, 10:29 AM

## 2015-04-25 NOTE — Telephone Encounter (Signed)
Spoke with pts son to schedule, interp requested, dpm

## 2015-04-25 NOTE — Progress Notes (Addendum)
Nutrition Follow-up  DOCUMENTATION CODES:   Severe malnutrition in context of acute illness/injury, Underweight  INTERVENTION:   -D/c Glucerna Shake, due to poor acceptance -Increase 30 ml Prostat to TID -Magic Cup TID with meals  NUTRITION DIAGNOSIS:   Increased nutrient needs related to wound healing as evidenced by estimated needs.  Ongoing  GOAL:   Patient will meet greater than or equal to 90% of their needs  Unmet  MONITOR:   PO intake, Supplement acceptance, Labs, Weight trends, Skin, I & O's  REASON FOR ASSESSMENT:   Malnutrition Screening Tool    ASSESSMENT:   81 y.o. male who presents for evaluation of nonhealing ulcers right foot. This Viet[place]ese patient does not speak [place]. He is accompanied by his son and daughter-in-law. Apparently he has had a nonhealing ulcer on the dorsum of the right foot and on the heel for 3-6 months.  S/p Procedure 04/24/15:  Right above knee amputation  [facility]   Pt sitting up in bed, sleeping at time of visit. No family present.   Reviewed SLP note from this AM; pt with oral delays and holding. Diet has been changes to dysphagia 1 with nectar thick liquids. Meal completion variable; PO: 5-65%.   Pt has been accepting Glucerna supplements ~50% of the time; noted opened Glucerna supplement on bedside table. Pt has been [facility]stat [facility] will continue order. Pt would also benefit from [employer] Cup supplement.   CSW following. [facility] once medically stable.   Labs reviewed.  Diet Order:  DIET - DYS 1 Room service appropriate?: Yes; Fluid consistency:: Nectar Thick  Skin:  Wound (see comment) (rt thigh incision)  Last BM:  04/23/15  Height:   Ht Readings from Last 1 Encounters:  04/21/15 5\' 7"  (1.702 m)    Weight:   Wt Readings from Last 1 Encounters:  04/21/15 117 lb 4.6 oz (53.2 kg)    Ideal Body Weight:  61.9 kg  BMI:  Body mass index  is 18.37 kg/(m^2).  Estimated Nutritional Needs:   Kcal:  1550-1750  Protein:  75-85g  Fluid:  1.8L/day  EDUCATION NEEDS:   No education needs identified at this time  Tinsley Lomas A. Mayford KnifeWilliams, RD, LDN, CDE Pager: [phone] After hours Pager: [phone]

## 2015-04-25 NOTE — Progress Notes (Addendum)
  [facility] Progress Note  04/25/2015 8:38 AM POD 1  Subjective:  Denies pain.   Filed Vitals:   04/25/15 0621  BP: 142/63  Pulse: 80  Temp: 99.5 F (37.5 C)  Resp: 18    Physical Exam: Mild edema right AKA.  Staple line clean. No drainage. No bleeding.   CBC    Component Value Date/Time   WBC 15.3* 04/25/2015 0535   RBC 4.24 04/25/2015 0535   HGB 12.4* 04/25/2015 0535   HCT 37.7* 04/25/2015 0535   PLT 295 04/25/2015 0535   MCV 88.9 04/25/2015 0535   MCH 29.2 04/25/2015 0535   MCHC 32.9 04/25/2015 0535   RDW 13.7 04/25/2015 0535   LYMPHSABS 3.8 04/19/2015 1445   MONOABS 1.5* 04/19/2015 1445   EOSABS 0.0 04/19/2015 1445   BASOSABS 0.0 04/19/2015 1445    BMET    Component Value Date/Time   NA 137 04/25/2015 0535   K 3.7 04/25/2015 0535   CL 103 04/25/2015 0535   CO2 25 04/25/2015 0535   GLUCOSE 145* 04/25/2015 0535   BUN 8 04/25/2015 0535   CREATININE 0.88 04/25/2015 0535   CREATININE 1.34 11/25/2013 0946   CALCIUM 8.1* 04/25/2015 0535   GFRNONAA >60 04/25/2015 0535   GFRAA >60 04/25/2015 0535    INR    Component Value Date/Time   INR 1.32 04/24/2015 0539     Intake/Output Summary (Last 24 hours) at 04/25/15 0838 Last data filed at 04/25/15 [id number]  Gross per 24 hour  Intake   1250 ml  Output   2350 ml  Net  -1100 ml     Assessment/Plan:  81 y.o. male is s/p right above knee amputation  POD 1  Patient continuously pulling at dressing.  Dressing off when patient evaluated.  AKA healing well. WBC elevated. Likely reactive from surgery. Blood cultures negative. Ok to discontinue abx today.  Ok to d/c to SNF when bed available.  F/u in 4 weeks with Dr. Darrick PennaFields.    Maris BergerKimberly Trinh, PA-C [facility] Office: [phone] Pager: [phone] 04/25/2015 8:38 AM  Agree with above.  AKA is clean. Keep dry dressing for protection No further need for antibiotics Ok for d/c from our standpoint We will arrange  follow up in 1 month  Fabienne Brunsharles Giovanni Bath, MD [facility] [place] Office: [phone] Pager: [phone]

## 2015-04-25 NOTE — Progress Notes (Signed)
Speech Language Pathology Treatment: Dysphagia  Patient Details Name: Jonathan Richards MRN: [id number] DOB: 03/03/1934 Today's Date: 04/25/2015 Time: [id number] SLP Time Calculation (min) (ACUTE ONLY): 10 min  Assessment / Plan / Recommendation Clinical Impression  Pt familiar to this SLP from previous visits during which he consumed very little due to lethargy and refusal. One trial of ice cream observed with oral delays and mild holding, suspected delayed swallow initiation. Delayed cough with pt "hocking" pharyngeal residuals with ice cream. Pt expectorated second bolus of nectar thick juice and refused further attempts. SLP called surgical PA who agreed to diet modification to Dys 1 and nectar liquids from clear liquids. ST will continue intervention.    HPI Other Pertinent Information: Pt is 81 y.o. male, [place], with hx of CVAs, dementia, admitted with sepsis secondary to nonhealing ulcer of right foot, acute delirium, more difficulty swallowing per dtr-in-law.  Pt with hx of dysphagia: has had at least two MBS studies (1/15, 12/15) which revealed mod dysphagia due to dementia. Pt has been on dysphagia 1, thin liquids subsequently.            Pertinent Vitals Pain Assessment: No/denies pain  SLP Plan  Continue with current plan of care    Recommendations Diet recommendations: Dysphagia 1 (puree);Nectar-thick liquid Liquids provided via: Cup Medication Administration: Crushed with puree Supervision: Staff to assist with self feeding;Full supervision/cueing for compensatory strategies Compensations: Small sips/bites;Slow rate;Check for pocketing;Multiple dry swallows after each bite/sip Postural Changes and/or Swallow Maneuvers: Seated upright 90 degrees              Oral Care Recommendations: Oral care BID Follow up Recommendations: Skilled Nursing facility;24 hour supervision/assistance Plan: Continue with current plan of care    GO     Royce MacadamiaLitaker, Teryn Boerema Willis 04/25/2015, 10:15  AM  Breck CoonsLisa Willis Lonell FaceLitaker M.Ed [employer] Pager [phone]

## 2015-04-25 NOTE — Progress Notes (Signed)
Continues to pull dressing off to right AKA. Attempted to redirect; family explained to pt importance of leaving dressing on. Staples are intact with no redness, swelling, drainage. Will continue to monitor

## 2015-04-25 NOTE — Progress Notes (Signed)
PROGRESS NOTE    Su HoffDai V Vasey WGN:[id number] DOB: 04/30/1934 DOA: 04/19/2015 PCP: Florentina JennyRIPP, HENRY, MD  HPI/Brief narrative 81 year old male, non-English speaking/Vietnamese, history of probable advanced Alzheimer's dementia, HTN, stroke, type II DM, CAD, ALF resident, followed at wound care center for nonhealing right foot wound, sent to [facility] ED on 04/19/15 secondary to tachycardia in the 120s. In the ED, heart rate in the 120s, WBC 18.7, lactic acid 3.08, sodium 151. Limited history due to patient's nonverbal. Hospitalized for sepsis secondary to infected nonhealing right foot wound. Vascular surgery [facility] and status post right AKA on 11/7. Plan is to DC to SNF 11/9 provided he is off restraints/mittens for 24 hours.  Assessment/Plan:  Sepsis - Present on admission. Had sinus tachycardia, leukocytosis and lactic acidosis on arrival. - Due to infected chronic nonhealing ulcer of right foot - Chest x-ray without acute findings. Urine microscopy negative. Blood cultures 2: Negative. Urine culture shows insignificant growth. - Treated empirically with IV vancomycin and Zosyn-will discontinue now that he is being 24 hours post AKA-confirmed with vascular surgery. - Resolved.  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 - As per vascular surgery input, ulceration and right foot was not a salvageable situation due to extensive vascular disease. They recommended AKA for pain control and have discussed with patient's family who agree. - S/P R AKA on 11/7. Mx per surgery. - Discussed with Dr. Darrick PennaFields 11/8: Cleared for discharge to SNF, no need for further antibiotics, follow-up with their office in 1 month for staple removal, daily dry dressing changes as needed.  Acute encephalopathy - Secondary to sepsis and  electrolyte abnormality/hyper natremia complicating advanced dementia. Reduced dose of Seroquel. - Resolved and mental status at baseline as per son.  Dehydration with hypernatremia - Resolved.  Hypokalemia/hypomagnesemia - Replaced.   Essential hypertension - Fluctuating and mildly uncontrolled. Continue metoprolol. Resume Norvasc that had been held. Continue to hold ARB.  Hyperlipidemia - Statins  Type II DM - Reasonable inpatient control. Holding metformin. Continue SSI.  CAD status post CABG 2002 - No report of chest pain. EKG without acute changes.  Dysphagia - Speech therapy input appreciated and continue dysphagia 1 diet and nectar thickened liquids. As per nursing, eat some with family.  Anemia - Stable    DVT prophylaxis: Lovenox  Code Status: Full Family Communication: Discussed with son at bedside on 11/7. None at bedside today. Disposition Plan: DC to SNF 11/9 provided he's been off restraints for 24 hours.   Consultants:  Vascular surgery   Procedures:  Right AKA 11/7.  Antibiotics:  IV vancomycin 11/2 > 11/8   IV Zosyn 11/2 > 11/8  Subjective: Patient nonverbal. Looking around. As per nursing, patient eats when family feeds him. Taking medications.  Objective: Filed Vitals:   04/24/15 1301 04/24/15 2139 04/25/15 0621 04/25/15 1424  BP: 151/69 153/61 142/63 161/71  Pulse: 78 88 80 79  Temp:  99.6 F (37.6 C) 99.5 F (37.5 C) 98.6 F (37 C)  TempSrc:  Oral Oral Oral  Resp:  18 18 16   Height:      Weight:      SpO2: 100% 100% 98% 100%    Intake/Output Summary (Last 24 hours) at 04/25/15 1429 Last data filed at 04/25/15 1013  Gross per 24 hour  Intake    350 ml  Output  1500 ml  Net  -1150 ml   Filed Weights   04/20/15 0400 04/20/15 2028 04/21/15 0600  Weight: 52 kg (114 lb 10.2 oz) 53.4 kg (117 lb 11.6 oz) 53.2 kg (117 lb 4.6 oz)     Exam:  General exam: Chronically ill-looking elderly male lying comfortably supine in bed.   Respiratory system: Clear/Poor inspiratory effort . No increased work of breathing. Cardiovascular system: S1 & S2 heard, RRR. No JVD, murmurs, gallops, clicks or pedal edema.  Gastrointestinal system: Abdomen is nondistended, soft and nontender. Normal bowel sounds heard. Central nervous system: Alert and tracks with eyes but nonverbal and does not follow instructions. No focal neurological deficits. Extremities: spontaneously moves all limbs. Right AKA site dressing clean and dry.   Data Reviewed: Basic Metabolic Panel:  Recent Labs Lab 04/20/15 0340 04/21/15 1107 04/22/15 0450 04/23/15 0320 04/24/15 0539 04/24/15 0952 04/25/15 0535  NA 153* 154* 148* 137 138  --  137  K 2.9* 4.3 4.0 3.0* 3.1*  --  3.7  CL 117* 124* 117* 105 103  --  103  CO2 27 22 21* 23 27  --  25  GLUCOSE 133* 118* 128* 172* 122*  --  145*  BUN --  8  CREATININE 1.03 1.25* 1.03 1.03 1.03  --  0.88  CALCIUM 9.2 8.9 8.5* 7.8* 8.3*  --  8.1*  MG 1.8  --   --   --   --  1.6* 1.9   Liver Function Tests:  Recent Labs Lab 04/19/15 1445 04/20/15 0340 04/21/15 1107 04/22/15 0450  AST 48* 46* 47* 44*  ALT 34 33 36 35  ALKPHOS 132* 122 105 108  BILITOT 1.4* 1.3* 1.1 1.7*  PROT 8.8* 7.6 6.7 6.1*  ALBUMIN 3.4* 2.9* 2.2* 1.9*   No results for input(s): LIPASE, AMYLASE in the last 168 hours. No results for input(s): AMMONIA in the last 168 hours. CBC:  Recent Labs Lab 04/19/15 1445  04/21/15 1107 04/22/15 0450 04/23/15 0320 04/24/15 0539 04/25/15 0535  WBC 18.7*  < > 30.1* 24.6* 18.0* 15.5* 15.3*  NEUTROABS 13.3*  --   --   --   --   --   --   HGB 15.8  < > 13.2 12.4* 11.2* 12.0* 12.4*  HCT 48.7  < > 41.7 38.6* 34.9* 37.3* 37.7*  MCV 93.5  < > 93.5 92.3 90.9 90.1 88.9  PLT 409*  < > 297 239 208 265 295  < > = values in this interval not displayed. Cardiac Enzymes: No results for input(s): CKTOTAL, CKMB, CKMBINDEX, TROPONINI in the last 168 hours. BNP (last 3 results) No  results for input(s): PROBNP in the last 8760 hours. CBG:  Recent Labs Lab 04/24/15 1232 04/24/15 1712 04/24/15 2137 04/25/15 0824 04/25/15 1156  GLUCAP 149* 118* 156* 161* 139*    Recent Results (from the past 240 hour(s))  Culture, blood (routine x 2)     Status: None   Collection Time: 04/19/15  2:45 PM  Result Value Ref Range Status   Specimen Description BLOOD LEFT WRIST  Final   Special Requests BOTTLES DRAWN AEROBIC AND ANAEROBIC  Final   Culture   Final    NO GROWTH 5 DAYS Performed at [facility]    Report Status 04/24/2015 FINAL  Final  Culture, blood (routine x 2)     Status: None   Collection Time: 04/19/15  2:58 PM  Result Value Ref Range Status  Specimen Description BLOOD RIGHT ANTECUBITAL  Final   Special Requests BOTTLES DRAWN AEROBIC ONLY  Final   Culture   Final    NO GROWTH 5 DAYS Performed at [facility]    Report Status 04/24/2015 FINAL  Final  MRSA PCR Screening     Status: Abnormal   Collection Time: 04/19/15  7:30 PM  Result Value Ref Range Status   MRSA by PCR POSITIVE (A) NEGATIVE Final    Comment:        The GeneXpert MRSA Assay (FDA approved for NASAL specimens only), is one component of a comprehensive MRSA colonization surveillance program. It is not intended to diagnose MRSA infection nor to guide or monitor treatment for MRSA infections. RESULT CALLED TO, READ BACK BY AND VERIFIED WITH: CROFTS,S RN [phone] [id number] COVINGTON,N   Urine culture     Status: None   Collection Time: 04/19/15  9:57 PM  Result Value Ref Range Status   Specimen Description URINE, CATHETERIZED  Final   Special Requests NONE  Final   Culture   Final    3,000 COLONIES/mL INSIGNIFICANT GROWTH Performed at [facility]    Report Status 04/21/2015 FINAL  Final  Surgical pcr screen     Status: Abnormal   Collection Time: 04/24/15 12:21 AM  Result Value Ref Range Status   MRSA, PCR POSITIVE (A) NEGATIVE Final    Comment:  RESULT CALLED TO, READ BACK BY AND VERIFIED WITH: WILEY,N RN [phone] 04/24/15 MITCHELL,L    Staphylococcus aureus POSITIVE (A) NEGATIVE Final    Comment:        The Xpert SA Assay (FDA approved for NASAL specimens in patients over 88 years of age), is one component of a comprehensive surveillance program.  Test performance has been validated by [facility] for patients greater than or equal to 44 year old. It is not intended to diagnose infection nor to guide or monitor treatment. RESULT CALLED TO, READ BACK BY AND VERIFIED WITHGlade Nurse RN [phone] 04/24/15 MITCHELL,L            Studies: No results found.      Scheduled Meds: . atorvastatin  20 mg Oral Daily  . collagenase   Topical Daily  . enoxaparin (LOVENOX) injection  40 mg Subcutaneous Q24H  . feeding supplement (GLUCERNA SHAKE)  237 mL Oral TID BM  . feeding supplement (PRO-STAT SUGAR FREE 64)  30 mL Oral BID  . fluocinonide-emollient  1 application Topical TID  . insulin aspart  0-15 Units Subcutaneous TID WC  . memantine  10 mg Oral BID  . metoprolol tartrate  25 mg Oral BID  . QUEtiapine  12.5 mg Oral QHS  . sodium chloride  3 mL Intravenous Q12H   Continuous Infusions: . lactated ringers 10 mL/hr at 04/24/15 8119    Active Problems:   Sepsis (HCC)   Pressure ulcer   Protein-calorie malnutrition, severe    Time spent: 25 minutes.    Marcellus Scott, MD, FACP, FHM. [facility] Pager [phone]  If 7PM-7AM, please contact night-coverage [email or web address] Password [email or web address] 04/25/2015, 2:29 PM    LOS: 6 days

## 2015-04-25 NOTE — Care Management Note (Signed)
Case Management Note  Patient Details  Name: Jonathan Richards MRN: [id number] Date of Birth: 10/10/1933  Subjective/Objective:       Patient is pod 1 aka, patient is for snf , csw following.             Action/Plan:   Expected Discharge Date:   (unknown)               Expected Discharge Plan:  Skilled Nursing Facility  In-House Referral:  Clinical Social Work  Discharge planning Services  CM Consult  Post Acute Care Choice:    Choice offered to:     DME Arranged:    DME Agency:     HH Arranged:    HH Agency:     Status of Service:  Completed, signed off  Medicare Important Message Given:  Yes-third notification given Date Medicare IM Given:    Medicare IM give by:    Date Additional Medicare IM Given:    Additional Medicare Important Message give by:     If discussed at Long Length of Stay Meetings, dates discussed:    Additional Comments:  Leone Havenaylor, Aubriegh Minch [place], RN 04/25/2015, 10:34 AM

## 2015-04-25 NOTE — Care Management Important Message (Signed)
Important Message  Patient Details  Name: Jonathan Richards MRN: [id number] Date of Birth: 02/24/1934   Medicare Important Message Given:  Yes-third notification given    Leone Havenaylor, Lenn Volker [place], RN 04/25/2015, 10:22 AMImportant Message  Patient Details  Name: Jonathan Richards MRN: [id number] Date of Birth: 09/28/1933   Medicare Important Message Given:  Yes-third notification given    Leone Havenaylor, Kinnie Kaupp [place], RN 04/25/2015, 10:22 AM

## 2015-04-25 NOTE — Telephone Encounter (Signed)
-----   Message from Phillips Odorarol S Pullins, RN sent at 04/24/2015 10:32 AM EST ----- Regarding: Joyce GrossKay log; also needs 1 mo f/u with Dr. Darrick PennaFields for staple removal/ INTERPRETER NEEDED   ----- Message -----    From: Sherren Kernsharles E Fields, MD    Sent: 04/24/2015   9:00 AM      To: Vvs Charge Pool  Right AKA Maureen asst  He needs follow up appt in 1 month to take out staples.  He does not speak English so will need interpreter at that appt.  Fabienne Brunsharles Fields

## 2015-04-25 NOTE — Consult Note (Signed)
   [name] CM Inpatient Consult   04/25/2015  Jonathan Richards 12/10/1933 [id number]   EP[facility]. Noted patient is going to SNF at discharge. Will not engage for services at this time. Appreciative of referral.   Raiford Nobletika Kleigh Hoelzer, MSN-Ed, RN,BSN [facility] Care Management Hospital Liaison [phone]

## 2015-04-26 DIAGNOSIS — E43 Unspecified severe protein-calorie malnutrition: Secondary | ICD-10-CM

## 2015-04-26 DIAGNOSIS — T148 Other injury of unspecified body region: Secondary | ICD-10-CM

## 2015-04-26 DIAGNOSIS — E870 Hyperosmolality and hypernatremia: Secondary | ICD-10-CM | POA: Insufficient documentation

## 2015-04-26 DIAGNOSIS — T148XXA Other injury of unspecified body region, initial encounter: Secondary | ICD-10-CM

## 2015-04-26 DIAGNOSIS — F0390 Unspecified dementia without behavioral disturbance: Secondary | ICD-10-CM

## 2015-04-26 DIAGNOSIS — A419 Sepsis, unspecified organism: Principal | ICD-10-CM

## 2015-04-26 DIAGNOSIS — L089 Local infection of the skin and subcutaneous tissue, unspecified: Secondary | ICD-10-CM | POA: Insufficient documentation

## 2015-04-26 LAB — CREATININE, SERUM
Creatinine, Ser: 0.77 mg/dL (ref 0.61–1.24)
GFR calc non Af Amer: >60 mL/min (ref >60)

## 2015-04-26 LAB — GLUCOSE, CAPILLARY
Glucose-Capillary: 131 mg/dL — ABNORMAL HIGH (ref 65–99)
Glucose-Capillary: 131 mg/dL — ABNORMAL HIGH (ref 65–99)

## 2015-04-26 MED ORDER — STARCH (THICKENING) PO POWD: ORAL | Status: AC

## 2015-04-26 MED ORDER — PRO-STAT SUGAR FREE PO LIQD: 30.0000 mL | Freq: Three times a day (TID) | ORAL | Status: DC

## 2015-04-26 MED ORDER — METOPROLOL TARTRATE 25 MG PO TABS: 25.0000 mg | ORAL_TABLET | Freq: Two times a day (BID) | ORAL | Status: AC

## 2015-04-26 NOTE — Progress Notes (Addendum)
Pt prepared for d/c to SNF. IV d/c'd. Skin intact except as charted in most recent assessments. Vitals are stable. Attempted report to receiving facility. Pt to be transported by ambulance service.

## 2015-04-26 NOTE — Progress Notes (Signed)
[facility] Anticipated discharge date:04/26/15 Family notified: daughter in law at bedside [facility] scheduled [facility] 1:30[facility]  CSW signing off.  Merlyn LotJenna Holoman, LCSWA Clinical Social Worker [phone]

## 2015-04-26 NOTE — Progress Notes (Addendum)
  [facility] Progress Note  Subjective  - POD #2  Daughter at bedside feeding patient.   Objective Filed Vitals:   04/26/15 0517  BP: 124/62  Pulse: 80  Temp: 98.2 F (36.8 C)  Resp: 18    Intake/Output Summary (Last 24 hours) at 04/26/15 0807 Last data filed at 04/25/15 2100  Gross per 24 hour  Intake    340 ml  Output    600 ml  Net   -260 ml   Right AKA warm and soft. Incision clean and intact.   Assessment/Planning: 81 y.o. male is s/p: right AKA 2 Days Post-Op   Stump is viable. Informed family to keep stump clean and wrapped.  Ok to d/c to SNF. Follow up set up for 4 weeks with Dr. Darrick PennaFields.   Raymond GurneyKimberly A Trinh 04/26/2015 8:07 AM --  Laboratory CBC    Component Value Date/Time   WBC 15.3* 04/25/2015 0535   HGB 12.4* 04/25/2015 0535   HCT 37.7* 04/25/2015 0535   PLT 295 04/25/2015 0535    BMET    Component Value Date/Time   NA 137 04/25/2015 0535   K 3.7 04/25/2015 0535   CL 103 04/25/2015 0535   CO2 25 04/25/2015 0535   GLUCOSE 145* 04/25/2015 0535   BUN 8 04/25/2015 0535   CREATININE 0.77 04/26/2015 0524   CREATININE 1.34 11/25/2013 0946   CALCIUM 8.1* 04/25/2015 0535   GFRNONAA >60 04/26/2015 0524   GFRAA >60 04/26/2015 0524    COAG Lab Results  Component Value Date   INR 1.32 04/24/2015   INR 1.42 04/19/2015   No results found for: PTT  Antibiotics Anti-infectives    Start     Dose/Rate Route Frequency Ordered Stop   04/21/15 1806  vancomycin (VANCOCIN) IVPB 750 mg/150 ml premix  Status:  Discontinued     750 mg 150 mL/hr over 60 Minutes Intravenous Every 12 hours 04/21/15 1807 04/25/15 1137   04/20/15 0600  vancomycin (VANCOCIN) 500 mg in sodium chloride 0.9 % 100 mL IVPB  Status:  Discontinued     500 mg 100 mL/hr over 60 Minutes Intravenous Every 12 hours 04/19/15 1606 04/21/15 1807   04/19/15 2200  piperacillin-tazobactam (ZOSYN) IVPB 3.375 g  Status:  Discontinued     3.375 g 12.5 mL/hr over 240 Minutes  Intravenous 3 times per day 04/19/15 1606 04/25/15 1137   04/19/15 1545  piperacillin-tazobactam (ZOSYN) IVPB 3.375 g     3.375 g 100 mL/hr over 30 Minutes Intravenous  Once 04/19/15 1531 04/19/15 1636   04/19/15 1545  vancomycin (VANCOCIN) IVPB 1000 mg/200 mL premix     1,000 mg 200 mL/hr over 60 Minutes Intravenous  Once 04/19/15 1531 04/19/15 1741       Maris BergerKimberly Trinh, PA-C [facility] Office: [phone] Pager: [phone] 04/26/2015 8:07 AM     Agree with above DC to SNF today Follow-up with Dr. Darrick Pennafields in 3-4 weeks

## 2015-04-26 NOTE — Discharge Summary (Signed)
Physician Discharge Summary  Jonathan Richards ZOX:[id number] DOB: 1935-08-21NYAIRE [name]  PCP: Florentina Jenny, MD  Admit date: 04/19/2015 Discharge date: 04/26/2015  Recommendations for Outpatient Follow-up:  1. Pt will need to follow up with PCP in 2 weeks post discharge 2. Please obtain BMP in one week  Discharge Diagnoses:  Sepsis - Present on admission. Had sinus tachycardia, leukocytosis and lactic acidosis on arrival. - Due to infected chronic nonhealing ulcer of right foot - Chest x-ray without acute findings. Urine microscopy negative. Blood cultures 2: Negative. Urine culture shows insignificant growth. - Treated empirically with IV vancomycin and Zosyn-will discontinue now that he is being 24 hours post AKA-confirmed with vascular surgery. - Resolved.  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 - As per vascular surgery input, ulceration and right foot was not a salvageable situation due to extensive vascular disease. They recommended AKA for pain control and have discussed with patient's family who agree. - S/P R AKA on 11/7. Mx per surgery. - Discussed with Dr. Darrick Penna 11/8: Cleared for discharge to SNF, no need for further antibiotics, follow-up with their office in 1 month for staple removal, daily dry dressing changes as needed.  Acute encephalopathy - Secondary to sepsis and electrolyte abnormality/hyper natremia complicating advanced dementia. Reduced dose of Seroquel. - Resolved and mental status at baseline as per son.  Dehydration with hypernatremia - Resolved.  Hypokalemia/hypomagnesemia - Replaced.   Essential hypertension - Fluctuating and mildly uncontrolled. Continue metoprolol. Resume Norvasc that had been held. Continue to hold ARB.  Hyperlipidemia - Statins  Type II DM - Reasonable inpatient control. Holding  metformin. Continue SSI. -04/20/2015 hemoglobin A1c 6.8 -Resume metformin after discharge  CAD status post CABG 2002 - No report of chest pain. EKG without acute changes.  Dysphagia - Speech therapy input appreciated and continue dysphagia 1 diet and nectar thickened liquids. As per nursing, eat some with family.  Anemia - Stable  Discharge Condition: stable  Disposition: SNF Follow-up Information    Follow up with Jonathan Bruns, MD In 4 weeks.   Specialties:  Vascular Surgery, Cardiology   Why:  Our office will call you to arrange an appointment (sent)   Contact information:   [address] [place] [address] [phone]       Diet:dysphagia 1 with nectar thicken liquids Wt Readings from Last 3 Encounters:  04/21/15 53.2 kg (117 lb 4.6 oz)  03/23/15 68.04 kg (150 lb)  11/25/13 66.225 kg (146 lb)    History of present illness:  81 year old male, non-English speaking/Vietnamese, history of probable advanced Alzheimer's dementia, HTN, stroke, type II DM, CAD, ALF resident, followed at wound care center for nonhealing right foot wound, sent to [facility] ED on 04/19/15 secondary to tachycardia in the 120s. In the ED, heart rate in the 120s, WBC 18.7, lactic acid 3.08, sodium 151. Limited history due to patient's nonverbal. Hospitalized for sepsis secondary to infected nonhealing right foot wound. Vascular surgery recommended transferring patient to [facility] and status post right AKA on 11/7. Plan is to DC to SNF 11/9 since he is off restraints/mittens for 24 hours.  Consultants: Dr. Hartley [facility]  Discharge Exam: Filed Vitals:   04/26/15 0517  BP: 124/62  Pulse: 80  Temp: 98.2 F (36.8 C)  Resp: 18   Filed Vitals:   04/25/15 0865 04/25/15 1424 04/25/15 2115 04/26/15 7846  BP: 142/63 161/71 138/60 124/62  Pulse: 80 79 87 80  Temp: 99.5 F (37.5 C) 98.6 F (37 C) 98.3 F (36.8 C) 98.2 F (36.8 C)  TempSrc: Oral Oral Oral Oral  Resp: Height:      Weight:      SpO2: 98% 100% 100% 98%   General: Alert and awake, NAD, pleasant, cooperative Cardiovascular: RRR, no rub, no gallop, no S3 Respiratory: CTAB, no wheeze, no rhonchi Abdomen:soft, nontender, nondistended, positive bowel sounds Extremities: R-AKA site without erythema or drainage  Discharge Instructions      Discharge Instructions    Diet - low sodium heart healthy    Complete by:  As directed      Increase activity slowly    Complete by:  As directed             Medication List    STOP taking these medications        losartan 50 MG tablet  Commonly known as:  COZAAR      TAKE these medications        amLODipine 5 MG tablet  Commonly known as:  NORVASC  Take 5 mg by mouth every morning.     atorvastatin 20 MG tablet  Commonly known as:  LIPITOR  Take 20 mg by mouth daily.     cetaphil cream  Apply 1 application topically 2 (two) times daily as needed (for dry skin areas).     cholecalciferol 400 UNITS Tabs tablet  Commonly known as:  VITAMIN D  Take 800 Units by mouth daily.     dexlansoprazole 60 MG capsule  Commonly known as:  DEXILANT  Take 60 mg by mouth every morning.     feeding supplement (PRO-STAT SUGAR FREE 64) Liqd  Take 30 mLs by mouth 3 (three) times daily between meals.     fluocinonide-emollient 0.05 % cream  Commonly known as:  LIDEX-E  Apply 1 application topically 3 (three) times daily.     food thickener Powd  Commonly known as:  THICK IT  Add to thin liquids to make nectar thickened     memantine 10 MG tablet  Commonly known as:  NAMENDA  Take 10 mg by mouth 2 (two) times daily.     metFORMIN 500 MG tablet  Commonly known as:  GLUCOPHAGE  Take 500 mg by mouth at bedtime.     metoprolol tartrate 25 MG tablet  Commonly known as:  LOPRESSOR  Take 1 tablet (25 mg total) by mouth 2 (two) times daily.     QUEtiapine 25 MG tablet  Commonly known as:  SEROQUEL  Take 25 mg by mouth at bedtime.       traMADol 50 MG tablet  Commonly known as:  ULTRAM  Take 50 mg by mouth every 12 (twelve) hours as needed for moderate pain.     triamcinolone cream 0.1 %  Commonly known as:  KENALOG  Apply 1 application topically every 12 (twelve) hours as needed (for dry skin areas).     vitamin A & D ointment  Apply 1 application topically 3 (three) times daily.         The results of significant diagnostics from this hospitalization (including imaging, microbiology, ancillary and laboratory) are listed below for reference.    Significant Diagnostic Studies: Dg Chest 1 View  04/19/2015  CLINICAL DATA:  Tachycardia. Diabetes and hypertension. Nonhealing right foot wound. Coronary artery disease. EXAM: CHEST 1 VIEW COMPARISON:  06/24/2013 FINDINGS: Heart size remains normal. Tortuosity of thoracic aorta is stable. Prior CABG again noted. Both lungs are clear. No evidence of pneumothorax or pleural effusion. IMPRESSION: No active disease. Electronically Signed   By: Myles Rosenthal M.D.   On: 04/19/2015 15:51   Dg Foot 2 Views Right  04/19/2015  CLINICAL DATA:  81 year old male with right-sided foot pain. Nonhealing right foot wound. History of diabetes and hypertension. EXAM: RIGHT FOOT - 2 VIEW COMPARISON:  Right foot radiograph 03/31/2015. FINDINGS: No aggressive appearing areas of osteolytic cysts are noted. No acute displaced fracture, subluxation or dislocation. Multifocal joint space narrowing, subchondral sclerosis, subchondral cyst formation and osteophyte formation, compatible with osteoarthritis, most severe in the first MTP and first interphalangeal joints. IMPRESSION: 1. No acute radiographic abnormality of the right foot. Chronic findings, as above Electronically Signed   By: Trudie Reed M.D.   On: 04/19/2015 15:52   Dg Foot 2 Views Right  03/31/2015  CLINICAL DATA:  Nonhealing ulcer on heel and on top near proximal metatarsals. EXAM: RIGHT FOOT - 2 VIEW COMPARISON:  None. FINDINGS: No  acute bony abnormality. Specifically, no fracture, subluxation, or dislocation. No radiographic changes of osteomyelitis. Plantar and posterior calcaneal spurs noted. Vascular calcifications present. IMPRESSION: No acute bony abnormality. Electronically Signed   By: Charlett Nose M.D.   On: 03/31/2015 14:09     Microbiology: Recent Results (from the past 240 hour(s))  Culture, blood (routine x 2)     Status: None   Collection Time: 04/19/15  2:45 PM  Result Value Ref Range Status   Specimen Description BLOOD LEFT WRIST  Final   Special Requests BOTTLES DRAWN AEROBIC AND ANAEROBIC  Final   Culture   Final    NO GROWTH 5 DAYS Performed at [facility]    Report Status 04/24/2015 FINAL  Final  Culture, blood (routine x 2)     Status: None   Collection Time: 04/19/15  2:58 PM  Result Value Ref Range Status   Specimen Description BLOOD RIGHT ANTECUBITAL  Final   Special Requests BOTTLES DRAWN AEROBIC ONLY  Final   Culture   Final    NO GROWTH 5 DAYS Performed at [facility]    Report Status 04/24/2015 FINAL  Final  MRSA PCR Screening     Status: Abnormal   Collection Time: 04/19/15  7:30 PM  Result Value Ref Range Status   MRSA by PCR POSITIVE (A) NEGATIVE Final    Comment:        The GeneXpert MRSA Assay (FDA approved for NASAL specimens only), is one component of a comprehensive MRSA colonization surveillance program. It is not intended to diagnose MRSA infection nor to guide or monitor treatment for MRSA infections. RESULT CALLED TO, READ BACK BY AND VERIFIED WITH: CROFTS,S RN [phone] [id number] COVINGTON,N   Urine culture     Status: None   Collection Time: 04/19/15  9:57 PM  Result Value Ref Range Status   Specimen Description URINE, CATHETERIZED  Final   Special Requests NONE  Final   Culture   Final    3,000 COLONIES/mL INSIGNIFICANT GROWTH Performed at [facility]    Report Status 04/21/2015 FINAL  Final  Surgical pcr screen     Status:  Abnormal   Collection Time: 04/24/15 12:21 AM  Result Value Ref Range Status   MRSA, PCR POSITIVE (A) NEGATIVE Final    Comment: RESULT CALLED TO, READ BACK BY AND VERIFIED WITH: Glade Nurse RN [phone]  04/24/15 MITCHELL,L    Staphylococcus aureus POSITIVE (A) NEGATIVE Final    Comment:        The Xpert SA Assay (FDA approved for NASAL specimens in patients over 79 years of age), is one component of a comprehensive surveillance program.  Test performance has been validated by [facility] for patients greater than or equal to 79 year old. It is not intended to diagnose infection nor to guide or monitor treatment. RESULT CALLED TO, READ BACK BY AND VERIFIED WITH: WILEY,N RN [phone] 04/24/15 MITCHELL,L      Labs: Basic Metabolic Panel:  Recent Labs Lab 04/20/15 0340 04/21/15 1107 04/22/15 0450 04/23/15 0320 04/24/15 0539 04/24/15 0952 04/25/15 0535 04/26/15 0524  NA 153* 154* 148* 137 138  --  137  --   K 2.9* 4.3 4.0 3.0* 3.1*  --  3.7  --   CL 117* 124* 117* 105 103  --  103  --   CO2 27 22 21* 23 27  --  25  --   GLUCOSE 133* 118* 128* 172* 122*  --  145*  --   BUN 19 12 15 13 11   --  8  --   CREATININE 1.03 1.25* 1.03 1.03 1.03  --  0.88 0.77  CALCIUM 9.2 8.9 8.5* 7.8* 8.3*  --  8.1*  --   MG 1.8  --   --   --   --  1.6* 1.9  --    Liver Function Tests:  Recent Labs Lab 04/19/15 1445 04/20/15 0340 04/21/15 1107 04/22/15 0450  AST 48* 46* 47* 44*  ALT 34 33 36 35  ALKPHOS 132* 122 105 108  BILITOT 1.4* 1.3* 1.1 1.7*  PROT 8.8* 7.6 6.7 6.1*  ALBUMIN 3.4* 2.9* 2.2* 1.9*   No results for input(s): LIPASE, AMYLASE in the last 168 hours. No results for input(s): AMMONIA in the last 168 hours. CBC:  Recent Labs Lab 04/19/15 1445  04/21/15 1107 04/22/15 0450 04/23/15 0320 04/24/15 0539 04/25/15 0535  WBC 18.7*  < > 30.1* 24.6* 18.0* 15.5* 15.3*  NEUTROABS 13.3*  --   --   --   --   --   --   HGB 15.8  < > 13.2 12.4* 11.2* 12.0* 12.4*  HCT 48.7  < > 41.7 38.6*  34.9* 37.3* 37.7*  MCV 93.5  < > 93.5 92.3 90.9 90.1 88.9  PLT 409*  < > 297 239 208 265 295  < > = values in this interval not displayed. Cardiac Enzymes: No results for input(s): CKTOTAL, CKMB, CKMBINDEX, TROPONINI in the last 168 hours. BNP: Invalid input(s): POCBNP CBG:  Recent Labs Lab 04/25/15 0824 04/25/15 1156 04/25/15 1718 04/25/15 2203 04/26/15 0740  GLUCAP 161* 139* 126* 218* 131*    Time coordinating discharge:  Greater than 30 minutes  Signed:  Donevin Sainsbury, DO [facility] Pager: [phone] 04/26/2015, 10:23 AM

## 2015-04-27 ENCOUNTER — Encounter: Payer: Self-pay | Admitting: Internal Medicine

## 2015-04-27 ENCOUNTER — Non-Acute Institutional Stay (SKILLED_NURSING_FACILITY): Payer: Medicare Other | Admitting: Internal Medicine

## 2015-04-27 DIAGNOSIS — D62 Acute posthemorrhagic anemia: Secondary | ICD-10-CM | POA: Diagnosis not present

## 2015-04-27 DIAGNOSIS — F03918 Unspecified dementia, unspecified severity, with other behavioral disturbance: Secondary | ICD-10-CM

## 2015-04-27 DIAGNOSIS — L97519 Non-pressure chronic ulcer of other part of right foot with unspecified severity: Secondary | ICD-10-CM

## 2015-04-27 DIAGNOSIS — E1151 Type 2 diabetes mellitus with diabetic peripheral angiopathy without gangrene: Secondary | ICD-10-CM | POA: Diagnosis not present

## 2015-04-27 DIAGNOSIS — I10 Essential (primary) hypertension: Secondary | ICD-10-CM

## 2015-04-27 DIAGNOSIS — F0391 Unspecified dementia with behavioral disturbance: Secondary | ICD-10-CM | POA: Diagnosis not present

## 2015-04-27 DIAGNOSIS — G9340 Encephalopathy, unspecified: Secondary | ICD-10-CM | POA: Diagnosis not present

## 2015-04-27 DIAGNOSIS — R1310 Dysphagia, unspecified: Secondary | ICD-10-CM

## 2015-04-27 DIAGNOSIS — E11621 Type 2 diabetes mellitus with foot ulcer: Secondary | ICD-10-CM | POA: Diagnosis not present

## 2015-04-27 DIAGNOSIS — E785 Hyperlipidemia, unspecified: Secondary | ICD-10-CM | POA: Diagnosis not present

## 2015-04-27 DIAGNOSIS — L97509 Non-pressure chronic ulcer of other part of unspecified foot with unspecified severity: Secondary | ICD-10-CM

## 2015-04-27 DIAGNOSIS — A419 Sepsis, unspecified organism: Secondary | ICD-10-CM | POA: Diagnosis not present

## 2015-04-27 NOTE — Assessment & Plan Note (Addendum)
Stable A1c 6.8 on metformin 500 mg daily; pt on statin ( was on  ARB being held 2/2 renal fx );Plan - continue all current meds

## 2015-04-27 NOTE — Assessment & Plan Note (Signed)
Secondary to sepsis and electrolyte abnormality/hyper natremia complicating advanced dementia. Reduced dose of Seroquel. - Resolved and mental status at baseline as per son

## 2015-04-27 NOTE — Assessment & Plan Note (Signed)
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 - As per vascular surgery input, ulceration and right foot was not a salvageable situation due to extensive vascular disease. They recommended AKA for pain control and have discussed with patient's family who agree. - S/P R AKA on 11/7. Mx per surgery. - Discussed with Dr. Darrick PennaFields 11/8: Cleared for discharge to SNF, no need for further antibiotics, follow-up with their office in 1 month for staple removal, daily dry dressing changes as needed.  SNF - wound care and DM2 managment , see below

## 2015-04-27 NOTE — Progress Notes (Signed)
MRN: [id number] Name: EUEL CASTILE  Sex: male Age: 81 y.o. DOB: 04-Oct-1933  PSC #: [facility] Persons Facility/Room: Level Of Care: SNF Provider: Merrilee Seashore D Emergency Contacts: Extended Emergency Contact Information Primary Emergency Contact: Jacobo [place] Mobile Phone: [phone] Relation: Son  Code Status:   Allergies: Psychiatric nurse Complaint  Patient presents with  . New Admit To SNF    HPI: Patient is 81 y.o. male with h/o dementia, HTN, stroke, noninsulin dependent diabetes, sent to [facility] ED from assisted living facility due to tachycardia, patient has been treated for nonhealing right foot wound at wound care center, and he is referred to vascular surgery due to nonhealing wound, but has not been evaluated by vascular surgery yet. He has home health nurse coming in to visit him three time a week, today he was found to be tachycardia in to the 120's, he is sent to [facility] ED. In the ED, his heart rate initial on presentation in the 120's, bp stable, initial labs showed leukocytosis of 18.7 , lactic acidosis 3.08, hypernatremia, na 151 and mild . Pt was admitted to hospital from 11/2-9, where he was tx for sepsis from ulcers and where pt underwent an AKA on 11/7 for non healing ulcers 2/2 diabetic PVD. Hospital course was complicated by encaphalopathy, hypernatremia and other electrolyte abnormalities . Pt is admitted to SNF with generalized weakness and for OT/PT. While at SNF pt will be followed for DM2, tx with glucophage, HLD, tx with lipitor and HTN, tx with norvasc and metoprolol.   Past Medical History  Diagnosis Date  . Diabetes mellitus without complication (HCC)   . Hypertension   . Alzheimer disease   . CAD (coronary artery disease)   . Stroke [facility])     Past Surgical History  Procedure Laterality Date  . Coronary artery bypass graft    . Amputation Right 04/24/2015    Procedure: AMPUTATION ABOVE KNEE;  Surgeon: Sherren Kerns, MD;  Location: [facility] OR;   Service: Vascular;  Laterality: Right;      Medication List       This list is accurate as of: 04/27/15 11:59 PM.  Always use your most recent med list.               amLODipine 5 MG tablet  Commonly known as:  NORVASC  Take 5 mg by mouth every morning.     atorvastatin 20 MG tablet  Commonly known as:  LIPITOR  Take 20 mg by mouth daily.     cetaphil cream  Apply 1 application topically 2 (two) times daily as needed (for dry skin areas).     cholecalciferol 400 UNITS Tabs tablet  Commonly known as:  VITAMIN D  Take 800 Units by mouth daily.     dexlansoprazole 60 MG capsule  Commonly known as:  DEXILANT  Take 60 mg by mouth every morning.     feeding supplement (PRO-STAT SUGAR FREE 64) Liqd  Take 30 mLs by mouth 3 (three) times daily between meals.     fluocinonide-emollient 0.05 % cream  Commonly known as:  LIDEX-E  Apply 1 application topically 3 (three) times daily.     food thickener Powd  Commonly known as:  THICK IT  Add to thin liquids to make nectar thickened     memantine 10 MG tablet  Commonly known as:  NAMENDA  Take 10 mg by mouth 2 (two) times daily.     metFORMIN 500 MG tablet  Commonly known as:  GLUCOPHAGE  Take 500 mg by mouth at bedtime.     metoprolol tartrate 25 MG tablet  Commonly known as:  LOPRESSOR  Take 1 tablet (25 mg total) by mouth 2 (two) times daily.     QUEtiapine 25 MG tablet  Commonly known as:  SEROQUEL  Take 25 mg by mouth at bedtime.     traMADol 50 MG tablet  Commonly known as:  ULTRAM  Take 50 mg by mouth every 12 (twelve) hours as needed for moderate pain.     triamcinolone cream 0.1 %  Commonly known as:  KENALOG  Apply 1 application topically every 12 (twelve) hours as needed (for dry skin areas).     vitamin A & D ointment  Apply 1 application topically 3 (three) times daily.        No orders of the defined types were placed in this encounter.    Immunization History  Administered Date(s)  Administered  . PPD Test 12/14/2012  . Tdap 06/24/2013    Social History  Substance Use Topics  . Smoking status: Never Smoker   . Smokeless tobacco: Not on file  . Alcohol Use: No    Family history is + MI , son   Review of Systems  DATA OBTAINED: from nurse GENERAL:  no fevers, fatigue, appetite changes SKIN: No itching, rash or wounds EYES: No eye pain, redness, discharge EARS: No earache, tinnitus, change in hearing NOSE: No congestion, drainage or bleeding  MOUTH/THROAT: No mouth or tooth pain, No sore throat RESPIRATORY: No cough, wheezing, SOB CARDIAC: No chest pain, palpitations, lower extremity edema  GI: No abdominal pain, No N/V/D or constipation, No heartburn or reflux  GU: No dysuria, frequency or urgency, or incontinence  MUSCULOSKELETAL: No unrelieved bone/joint pain NEUROLOGIC: No headache, dizziness or focal weakness PSYCHIATRIC: No c/o anxiety or sadness   Filed Vitals:   04/30/15 2104  BP: 135/55  Pulse: 88  Temp: 95.1 F (35.1 C)  Resp: 16    SpO2 Readings from Last 1 Encounters:  04/26/15 98%        Physical Exam  GENERAL APPEARANCE: Alert, min conversant, vietnamese speaking  No acute distress.  SKIN: No diaphoresis rash HEAD: Normocephalic, atraumatic  EYES: Conjunctiva/lids clear. Pupils round, reactive. EOMs intact.  EARS: External exam WNL, canals clear. Hearing grossly normal.  NOSE: No deformity or discharge.  MOUTH/THROAT: Lips w/o lesions  RESPIRATORY: Breathing is even, unlabored. Lung sounds are clear   CARDIOVASCULAR: Heart RRR no murmurs, rubs or gallops. No peripheral edema.   GASTROINTESTINAL: Abdomen is soft, non-tender, not distended w/ normal bowel sounds. GENITOURINARY: Bladder non tender, not distended  MUSCULOSKELETAL: R AKA, stump with dressing ;no heat or redness to area NEUROLOGIC:  Cranial nerves 2-12 grossly intact. Moves all extremities  PSYCHIATRIC: Mood and affect appear appropriate to situation, no  behavioral issues  Patient Active Problem List   Diagnosis Date Noted  . Diabetic foot ulcer (HCC) 04/27/2015  . Acute encephalopathy 04/27/2015  . Dysphagia 04/27/2015  . Hyperlipidemia 04/27/2015  . Postoperative anemia due to acute blood loss 04/27/2015  . Hypernatremia   . Wound infection (HCC)   . Protein-calorie malnutrition, severe 04/20/2015  . Sepsis (HCC) 04/19/2015  . Pressure ulcer 04/19/2015  . Acute intracranial hemorrhage (HCC) 06/24/2013  . Hypertension 10/22/2012  . Dementia with behavioral disturbance 10/22/2012  . DM (diabetes mellitus), type 2 with peripheral vascular complications (HCC) 10/22/2012  . Incontinence 10/22/2012  . Psoriasis of scalp 10/22/2012  CBC    Component Value Date/Time   WBC 15.3* 04/25/2015 0535   RBC 4.24 04/25/2015 0535   HGB 12.4* 04/25/2015 0535   HCT 37.7* 04/25/2015 0535   PLT 295 04/25/2015 0535   MCV 88.9 04/25/2015 0535   LYMPHSABS 3.8 04/19/2015 1445   MONOABS 1.5* 04/19/2015 1445   EOSABS 0.0 04/19/2015 1445   BASOSABS 0.0 04/19/2015 1445    CMP     Component Value Date/Time   NA 137 04/25/2015 0535   K 3.7 04/25/2015 0535   CL 103 04/25/2015 0535   CO2 25 04/25/2015 0535   GLUCOSE 145* 04/25/2015 0535   BUN 8 04/25/2015 0535   CREATININE 0.77 04/26/2015 0524   CREATININE 1.34 11/25/2013 0946   CALCIUM 8.1* 04/25/2015 0535   PROT 6.1* 04/22/2015 0450   ALBUMIN 1.9* 04/22/2015 0450   AST 44* 04/22/2015 0450   ALT 35 04/22/2015 0450   ALKPHOS 108 04/22/2015 0450   BILITOT 1.7* 04/22/2015 0450   GFRNONAA >60 04/26/2015 0524   GFRAA >60 04/26/2015 0524    Lab Results  Component Value Date   HGBA1C 6.8* 04/20/2015     Dg Chest 1 View  04/19/2015  CLINICAL DATA:  Tachycardia. Diabetes and hypertension. Nonhealing right foot wound. Coronary artery disease. EXAM: CHEST 1 VIEW COMPARISON:  06/24/2013 FINDINGS: Heart size remains normal. Tortuosity of thoracic aorta is stable. Prior CABG again noted. Both  lungs are clear. No evidence of pneumothorax or pleural effusion. IMPRESSION: No active disease. Electronically Signed   By: Myles Rosenthal M.D.   On: 04/19/2015 15:51   Dg Foot 2 Views Right  04/19/2015  CLINICAL DATA:  81 year old male with right-sided foot pain. Nonhealing right foot wound. History of diabetes and hypertension. EXAM: RIGHT FOOT - 2 VIEW COMPARISON:  Right foot radiograph 03/31/2015. FINDINGS: No aggressive appearing areas of osteolytic cysts are noted. No acute displaced fracture, subluxation or dislocation. Multifocal joint space narrowing, subchondral sclerosis, subchondral cyst formation and osteophyte formation, compatible with osteoarthritis, most severe in the first MTP and first interphalangeal joints. IMPRESSION: 1. No acute radiographic abnormality of the right foot. Chronic findings, as above Electronically Signed   By: Trudie Reed M.D.   On: 04/19/2015 15:52    Not all labs, radiology exams or other studies done during hospitalization come through on my EPIC note; however they are reviewed by me.    Assessment and Plan  Sepsis (HCC) Present on admission. Had sinus tachycardia, leukocytosis and lactic acidosis on arrival. - Due to infected chronic nonhealing ulcer of right foot - Chest x-ray without acute findings. Urine microscopy negative. Blood cultures 2: Negative. Urine culture shows insignificant growth. - Treated empirically with IV vancomycin and Zosyn-will discontinue now that he is being 24 hours post AKA-confirmed with vascular surgery. - Resolved.  Diabetic foot ulcer (HCC) Infected chronic nonhealing right foot ulcers, s/p right AKA on 11/7 - Complicating underlying PAD - As per chart review, nonambulatory due to previous CVA and chronic right hemiparesis. - As per vascular surgery input, ulceration and right foot was not a salvageable situation due to extensive vascular disease. They recommended AKA for pain control and have discussed with  patient's family who agree. - S/P R AKA on 11/7. Mx per surgery. - Discussed with Dr. Darrick Penna 11/8: Cleared for discharge to SNF, no need for further antibiotics, follow-up with their office in 1 month for staple removal, daily dry dressing changes as needed.  SNF - wound care and DM2 managment , see below  DM (diabetes mellitus), type 2 with peripheral vascular complications (HCC) Stable A1c 6.8 on metformin 500 mg daily; pt on statin ( was on  ARB being held 2/2 renal fx );Plan - continue all current meds  Acute encephalopathy Secondary to sepsis and electrolyte abnormality/hyper natremia complicating advanced dementia. Reduced dose of Seroquel. - Resolved and mental status at baseline as per son  Hypertension Fluctuating and mildly uncontrolled.SNF -  Continue metoprolol. Resume Norvasc that had been held. Continue to hold ARB.   Dysphagia Speech therapy input appreciated and continue dysphagia 1 diet and nectar thickened liquids. As per nursing, eat some with family.  Dementia with behavioral disturbance Chronic and currently stable- plan - cont namenda 10 mg BID and seroquel 25 mg q HS  Hyperlipidemia SNF -controlled , with DM2 want LDL < 70 ideally;LDL 64, HDL 36; Plan - cont lipitor 20 mg   Postoperative anemia due to acute blood loss SNF - d/c Hb 12.4 ; will check CBC   Time spent > 45 min;> 50% of time with patient was spent reviewing records, labs, tests and studies, counseling and developing plan of care  Margit HanksALEXANDER, ANNE D, MD

## 2015-04-27 NOTE — Assessment & Plan Note (Signed)
Chronic and currently stable- plan - cont namenda 10 mg BID and seroquel 25 mg q HS

## 2015-04-27 NOTE — Assessment & Plan Note (Signed)
Speech therapy input appreciated and continue dysphagia 1 diet and nectar thickened liquids. As per nursing, eat some with family.

## 2015-04-27 NOTE — Assessment & Plan Note (Signed)
SNF -controlled , with DM2 want LDL < 70 ideally;LDL 64, HDL 36; Plan - cont lipitor 20 mg

## 2015-04-27 NOTE — Assessment & Plan Note (Signed)
Present on admission. Had sinus tachycardia, leukocytosis and lactic acidosis on arrival. - Due to infected chronic nonhealing ulcer of right foot - Chest x-ray without acute findings. Urine microscopy negative. Blood cultures 2: Negative. Urine culture shows insignificant growth. - Treated empirically with IV vancomycin and Zosyn-will discontinue now that he is being 24 hours post AKA-confirmed with vascular surgery. - Resolved.

## 2015-04-27 NOTE — Assessment & Plan Note (Signed)
SNF - d/c Hb 12.4 ; will check CBC

## 2015-04-27 NOTE — Assessment & Plan Note (Signed)
Fluctuating and mildly uncontrolled.SNF -  Continue metoprolol. Resume Norvasc that had been held. Continue to hold ARB.

## 2015-04-28 ENCOUNTER — Encounter (HOSPITAL_BASED_OUTPATIENT_CLINIC_OR_DEPARTMENT_OTHER): Payer: Medicare Other

## 2015-04-30 ENCOUNTER — Encounter: Payer: Self-pay | Admitting: Internal Medicine

## 2015-05-17 ENCOUNTER — Non-Acute Institutional Stay (SKILLED_NURSING_FACILITY): Payer: Medicare Other | Admitting: Adult Health

## 2015-05-17 DIAGNOSIS — T814XXA Infection following a procedure, initial encounter: Secondary | ICD-10-CM

## 2015-05-17 DIAGNOSIS — IMO0001 Reserved for inherently not codable concepts without codable children: Secondary | ICD-10-CM

## 2015-05-19 ENCOUNTER — Encounter: Payer: Self-pay | Admitting: Vascular Surgery

## 2015-05-21 ENCOUNTER — Encounter: Payer: Self-pay | Admitting: Adult Health

## 2015-05-21 DIAGNOSIS — T8149XA Infection following a procedure, other surgical site, initial encounter: Secondary | ICD-10-CM | POA: Insufficient documentation

## 2015-05-21 MED ORDER — TRAMADOL HCL 50 MG PO TABS: 50.0000 mg | ORAL_TABLET | Freq: Four times a day (QID) | ORAL | Status: DC

## 2015-05-21 NOTE — Progress Notes (Signed)
Patient ID: Jonathan Richards, male   DOB: 11/23/1933, 81 y.o.   MRN: [id number]    Facility:  [facility]      Allergies  Allergen Reactions  . Asa [Aspirin] Other (See Comments)    Per daughter, pt is not allergic to ASA, but chart has this listed as an allergy.    Chief Complaint  Patient presents with  . Acute Visit    incision infection     HPI:  Staff reports that his incision line is red hot and inflamed. There is concern that he is developing a cellulitis on his incision line. There are no reports of fever present. He is unable to participate in the hpi or ros.   Past Medical History  Diagnosis Date  . Diabetes mellitus without complication ([place])   . Hypertension   . Alzheimer disease   . CAD (coronary artery disease)   . Stroke [facility])     Past Surgical History  Procedure Laterality Date  . Coronary artery bypass graft    . Amputation Right 04/24/2015    Procedure: AMPUTATION ABOVE KNEE;  Surgeon: Elam Dutch, MD;  Location: [facility] OR;  Service: Vascular;  Laterality: Right;    VITAL SIGNS BP 110/67 mmHg  Pulse 69  Ht 5' 6"  (1.676 m)  Wt 100 lb (45.36 kg)  BMI 16.15 kg/m2  Patient's Medications  New Prescriptions   No medications on file  Previous Medications   AMINO ACIDS-PROTEIN HYDROLYS (FEEDING SUPPLEMENT, PRO-STAT SUGAR FREE 64,) LIQD    Take 30 mLs by mouth 3 (three) times daily between meals.   AMLODIPINE (NORVASC) 5 MG TABLET    Take 5 mg by mouth every morning.   ATORVASTATIN (LIPITOR) 20 MG TABLET    Take 20 mg by mouth daily.   CETAPHIL (CETAPHIL) CREAM    Apply 1 application topically 2 (two) times daily as needed (for dry skin areas).    CHOLECALCIFEROL (VITAMIN D) 400 UNITS TABS TABLET    Take 800 Units by mouth daily.   DEXLANSOPRAZOLE (DEXILANT) 60 MG CAPSULE    Take 60 mg by mouth every morning.   FLUOCINONIDE-EMOLLIENT (LIDEX-E) 0.05 % CREAM    Apply 1 application topically 3 (three) times daily.   FOOD THICKENER (THICK IT) POWD    Add to thin  liquids to make nectar thickened   MEMANTINE (NAMENDA) 10 MG TABLET    Take 10 mg by mouth 2 (two) times daily.    METFORMIN (GLUCOPHAGE) 500 MG TABLET    Take 500 mg by mouth at bedtime.   METOPROLOL TARTRATE (LOPRESSOR) 25 MG TABLET    Take 1 tablet (25 mg total) by mouth 2 (two) times daily.   QUETIAPINE (SEROQUEL) 25 MG TABLET    Take 25 mg by mouth at bedtime.   TRAMADOL (ULTRAM) 50 MG TABLET    Take 50 mg by mouth every 12 (twelve) hours as needed for moderate pain.    TRIAMCINOLONE CREAM (KENALOG) 0.1 %    Apply 1 application topically every 12 (twelve) hours as needed (for dry skin areas).   VITAMINS A & D (VITAMIN A & D) OINTMENT    Apply 1 application topically 3 (three) times daily.  Modified Medications   No medications on file  Discontinued Medications   No medications on file     SIGNIFICANT DIAGNOSTIC EXAMS  04-19-15: chest x-ray: No active disease.   LABS REVIEWED:   04-19-15; wbc 18.7; hgb 15.8; hct 48.7; mcv 93.5; plt 409; glucose 184;  bun 27; creat 1.19; k+ 3.8; na++151; t protein 8.8; albumin 3.4; ast 48; alt 34; alk phos 132; t bili 1.4  blood and urine culture: no growth  04-20-15; wbc 18.5; hgb 14.4; hct 44.9; mcv 93.7; plt 384; glucose 133; bun 19; creat 1.03; k+ 2.9; na++153; t protein 7.6; albumin 2.9; ast 46; alt 33; alk phos 122; t bili 1.3; tsh 0.976; hgb a1c 6.8  mag 1.8 chol 123; ldl 64; trig 117; hdl 36 04-25-15: wbc 15.3; hgb 12.45; hct 37.7; mcv 88.9 ;plt 295; glucose 145; bun 8; creat 0.88; k+ 3.7; na++137; mag 1.9  05-01-15: wbc 13.5; hgb 15.1; hct 51.0; mcv 90.4; plt 584; glucose 142; bun 11; creat 0.79; k+ 4.6; na++141     Review of Systems  Unable to perform ROS: other     Physical Exam  Constitutional: No distress.  Frail   Eyes: Conjunctivae are normal.  Neck: Neck supple. No JVD present. No thyromegaly present.  Cardiovascular: Normal rate, regular rhythm and intact distal pulses.   Respiratory: Effort normal and breath sounds normal. No  respiratory distress. He has no wheezes.  GI: Soft. Bowel sounds are normal. He exhibits no distension. There is no tenderness.  Musculoskeletal: He exhibits no edema.  Able to move all extremities  Right aka   Lymphadenopathy:    He has no cervical adenopathy.  Neurological: He is alert.  Skin: Skin is warm and dry. He is not diaphoretic.  Right aka stump incision line with staples intact there redness inflammation mild swelling present; and scant drainage present. Area is  Tender to touch   Psychiatric: He has a normal mood and affect.       ASSESSMENT/ PLAN:  1. Cellulitis of incision line: will begin doxycycline 100 gm twice daily for 3 weeks with florastor twice daily for 3 weeks.  Will change the ultram 50 mg every 6 hours routinely      Ok Edwards NP [facility] Adult Medicine  Contact [phone] Monday through Friday 8am- 5pm  After hours call [phone]

## 2015-05-23 ENCOUNTER — Non-Acute Institutional Stay (SKILLED_NURSING_FACILITY): Payer: Medicare Other | Admitting: Adult Health

## 2015-05-23 DIAGNOSIS — K219 Gastro-esophageal reflux disease without esophagitis: Secondary | ICD-10-CM | POA: Diagnosis not present

## 2015-05-23 DIAGNOSIS — I10 Essential (primary) hypertension: Secondary | ICD-10-CM | POA: Diagnosis not present

## 2015-05-23 DIAGNOSIS — R1310 Dysphagia, unspecified: Secondary | ICD-10-CM

## 2015-05-23 DIAGNOSIS — F0391 Unspecified dementia with behavioral disturbance: Secondary | ICD-10-CM | POA: Diagnosis not present

## 2015-05-23 DIAGNOSIS — E1151 Type 2 diabetes mellitus with diabetic peripheral angiopathy without gangrene: Secondary | ICD-10-CM | POA: Diagnosis not present

## 2015-05-23 DIAGNOSIS — T814XXA Infection following a procedure, initial encounter: Secondary | ICD-10-CM

## 2015-05-23 DIAGNOSIS — R627 Adult failure to thrive: Secondary | ICD-10-CM

## 2015-05-23 DIAGNOSIS — IMO0001 Reserved for inherently not codable concepts without codable children: Secondary | ICD-10-CM

## 2015-05-23 DIAGNOSIS — F03918 Unspecified dementia, unspecified severity, with other behavioral disturbance: Secondary | ICD-10-CM

## 2015-05-24 LAB — BASIC METABOLIC PANEL
BUN: 11 mg/dL (ref 4–21)
CREATININE: 0.7 mg/dL (ref 0.6–1.3)
Glucose: 93 mg/dL
Potassium: 4.3 mmol/L (ref 3.4–5.3)
SODIUM: 141 mmol/L (ref 137–147)

## 2015-05-24 LAB — HEPATIC FUNCTION PANEL
ALK PHOS: 154 U/L — AB (ref 25–125)
ALT: 8 U/L — AB (ref 10–40)
AST: 16 U/L (ref 14–40)

## 2015-05-24 LAB — CBC AND DIFFERENTIAL
HCT: 43 % (ref 41–53)
Hemoglobin: 13.2 g/dL — AB (ref 13.5–17.5)
Platelets: 393 10*3/uL (ref 150–399)
WBC: 11.8 10^3/mL

## 2015-05-25 ENCOUNTER — Non-Acute Institutional Stay (SKILLED_NURSING_FACILITY): Payer: Medicare Other | Admitting: Adult Health

## 2015-05-25 ENCOUNTER — Encounter: Payer: Self-pay | Admitting: Adult Health

## 2015-05-25 ENCOUNTER — Encounter: Payer: Medicare Other | Admitting: Vascular Surgery

## 2015-05-25 DIAGNOSIS — R627 Adult failure to thrive: Secondary | ICD-10-CM | POA: Insufficient documentation

## 2015-05-25 DIAGNOSIS — R1310 Dysphagia, unspecified: Secondary | ICD-10-CM | POA: Diagnosis not present

## 2015-05-25 DIAGNOSIS — I739 Peripheral vascular disease, unspecified: Secondary | ICD-10-CM | POA: Insufficient documentation

## 2015-05-25 DIAGNOSIS — IMO0001 Reserved for inherently not codable concepts without codable children: Secondary | ICD-10-CM

## 2015-05-25 DIAGNOSIS — T814XXS Infection following a procedure, sequela: Secondary | ICD-10-CM

## 2015-05-25 DIAGNOSIS — K219 Gastro-esophageal reflux disease without esophagitis: Secondary | ICD-10-CM | POA: Insufficient documentation

## 2015-05-25 MED ORDER — QUETIAPINE FUMARATE 25 MG PO TABS: 12.5000 mg | ORAL_TABLET | Freq: Every day | ORAL | Status: DC

## 2015-05-25 MED ORDER — GABAPENTIN 100 MG PO CAPS: 100.0000 mg | ORAL_CAPSULE | Freq: Every day | ORAL | Status: AC

## 2015-05-25 MED ORDER — OXYCODONE HCL 5 MG PO TABS: 5.0000 mg | ORAL_TABLET | Freq: Four times a day (QID) | ORAL | Status: AC

## 2015-05-25 MED ORDER — MIRTAZAPINE 7.5 MG PO TABS: 7.5000 mg | ORAL_TABLET | Freq: Every day | ORAL | Status: DC

## 2015-05-25 NOTE — Progress Notes (Signed)
Patient ID: Jonathan Richards, male   DOB: 10-07-1933, 81 y.o.   MRN: [id number]    Facility:  [facility]      Allergies  Allergen Reactions  . Asa [Aspirin] Other (See Comments)    Per daughter, pt is not allergic to ASA, but chart has this listed as an allergy.    Chief Complaint  Patient presents with  . Acute Visit    family meeting     HPI:  We have come together to discuss his overalls status. He is not eating well; he will spit out foods if there is anything solid present; he will drink; he does require thickened liquids. His daughter tells me that she does not know why he is on seroquel. He will get angry with pain. She feels as though his pain is not being adequately managed. She is concerned about his left toe. She is interested in a palliative care consult.     Past Medical History  Diagnosis Date  . Diabetes mellitus without complication ([place])   . Hypertension   . Alzheimer disease   . CAD (coronary artery disease)   . Stroke [facility])     Past Surgical History  Procedure Laterality Date  . Coronary artery bypass graft    . Amputation Right 04/24/2015    Procedure: AMPUTATION ABOVE KNEE;  Surgeon: Elam Dutch, MD;  Location: [facility] OR;  Service: Vascular;  Laterality: Right;    VITAL SIGNS BP 119/79 mmHg  Pulse 70  Ht 5' 6"  (1.676 m)  Wt 100 lb (45.36 kg)  BMI 16.15 kg/m2  Patient's Medications  New Prescriptions   No medications on file  Previous Medications   AMINO ACIDS-PROTEIN HYDROLYS (FEEDING SUPPLEMENT, PRO-STAT SUGAR FREE 64,) LIQD    Take 30 mLs by mouth 3 (three) times daily between meals.   AMLODIPINE (NORVASC) 5 MG TABLET    Take 5 mg by mouth every morning.   CETAPHIL (CETAPHIL) CREAM    Apply 1 application topically 2 (two) times daily as needed (for dry skin areas).    CHOLECALCIFEROL (VITAMIN D) 400 UNITS TABS TABLET    Take 800 Units by mouth daily.   DEXLANSOPRAZOLE (DEXILANT) 60 MG CAPSULE    Take 60 mg by mouth every morning.   FLUOCINONIDE-EMOLLIENT (LIDEX-E) 0.05 % CREAM    Apply 1 application topically 3 (three) times daily.   FOOD THICKENER (THICK IT) POWD    Add to thin liquids to make nectar thickened   MEMANTINE (NAMENDA) 10 MG TABLET    Take 10 mg by mouth 2 (two) times daily.    METOPROLOL TARTRATE (LOPRESSOR) 25 MG TABLET    Take 1 tablet (25 mg total) by mouth 2 (two) times daily.   MIRTAZAPINE (REMERON) 7.5 MG TABLET    Take 1 tablet (7.5 mg total) by mouth at bedtime.   QUETIAPINE (SEROQUEL) 25 MG TABLET    Take 25 mg by mouth at bedtime.   TRAMADOL (ULTRAM) 50 MG TABLET    Take 1 tablet (50 mg total) by mouth 4 (four) times daily.   TRIAMCINOLONE CREAM (KENALOG) 0.1 %    Apply 1 application topically every 12 (twelve) hours as needed (for dry skin areas).   VITAMINS A & D (VITAMIN A & D) OINTMENT    Apply 1 application topically 3 (three) times daily.  Modified Medications   No medications on file  Discontinued Medications   No medications on file     SIGNIFICANT DIAGNOSTIC EXAMS   04-19-15:  chest x-ray: No active disease.   LABS REVIEWED:   04-19-15; wbc 18.7; hgb 15.8; hct 48.7; mcv 93.5; plt 409; glucose 184; bun 27; creat 1.19; k+ 3.8; na++151; t protein 8.8; albumin 3.4; ast 48; alt 34; alk phos 132; t bili 1.4  blood and urine culture: no growth  04-20-15; wbc 18.5; hgb 14.4; hct 44.9; mcv 93.7; plt 384; glucose 133; bun 19; creat 1.03; k+ 2.9; na++153; t protein 7.6; albumin 2.9; ast 46; alt 33; alk phos 122; t bili 1.3; tsh 0.976; hgb a1c 6.8  mag 1.8 chol 123; ldl 64; trig 117; hdl 36 04-25-15: wbc 15.3; hgb 12.45; hct 37.7; mcv 88.9 ;plt 295; glucose 145; bun 8; creat 0.88; k+ 3.7; na++137; mag 1.9  05-01-15: wbc 13.5; hgb 15.1; hct 51.0; mcv 90.4; plt 584; glucose 142; bun 11; creat 0.79; k+ 4.6; na++141     Review of Systems  Unable to perform ROS: other     Physical Exam  Constitutional: No distress.  Frail   Eyes: Conjunctivae are normal.  Neck: Neck supple. No JVD present.  No thyromegaly present.  Cardiovascular: Normal rate, regular rhythm and intact distal pulses.   Respiratory: Effort normal and breath sounds normal. No respiratory distress. He has no wheezes.  GI: Soft. Bowel sounds are normal. He exhibits no distension. There is no tenderness.  Musculoskeletal: He exhibits no edema.  Able to move all extremities  Right aka   Lymphadenopathy:    He has no cervical adenopathy.  Neurological: He is alert.  Skin: Skin is warm and dry. He is not diaphoretic.  Right aka stump incision line with staples intact there is less inflammation present there is no drainage present. Area does remain tender to touch  left foot 4th toe is discolored and tender to touch  Psychiatric: He has a normal mood and affect.     ASSESSMENT/ PLAN:  1. CVA 2. Dementia 3. FTT 4. Dysphagia 5. PAD  Will lower his seroquel to 12.5 mg nightly for one week then stop Will complete remeron for appetite Will continue doxycycline Will stop the ultram Will start oxycodone 5 mg every 6 hours routinely hold for sedation Will start neurontin 100 mg nightly for pain management  Will get ABI on left lower extremity  Will setup palliative care consult    Time spent with patient  45  minutes >50% time spent counseling; reviewing medical record; tests; labs; and developing future plan of care      Ok Edwards NP [facility] Adult Medicine  Contact [phone] Monday through Friday 8am- 5pm  After hours call [phone]

## 2015-05-25 NOTE — Progress Notes (Signed)
Patient ID: Jonathan Richards, male   DOB: Apr 04, 1934, 81 y.o.   MRN: [id number]   Facility:  [facility]      Allergies  Allergen Reactions  . Asa [Aspirin] Other (See Comments)    Per daughter, pt is not allergic to ASA, but chart has this listed as an allergy.    Chief Complaint  Patient presents with  . Medical Management of Chronic Issues    HPI:  He is a resident of this facility being seen for the management of his chronic illnesses. Staff reports that his po intake is poor. He is not eating much food; will drink somewhat better. He is unable to participate in the hpi or ros. We will need to have a care plan meeting for him.    Past Medical History  Diagnosis Date  . Diabetes mellitus without complication ([place])   . Hypertension   . Alzheimer disease   . CAD (coronary artery disease)   . Stroke [facility])     Past Surgical History  Procedure Laterality Date  . Coronary artery bypass graft    . Amputation Right 04/24/2015    Procedure: AMPUTATION ABOVE KNEE;  Surgeon: Elam Dutch, MD;  Location: [facility] OR;  Service: Vascular;  Laterality: Right;    VITAL SIGNS BP 110/64 mmHg  Pulse 68  Ht 5' 6"  (1.676 m)  Wt 100 lb (45.36 kg)  BMI 16.15 kg/m2  Patient's Medications  New Prescriptions   No medications on file  Previous Medications   AMINO ACIDS-PROTEIN HYDROLYS (FEEDING SUPPLEMENT, PRO-STAT SUGAR FREE 64,) LIQD    Take 30 mLs by mouth 3 (three) times daily between meals.   AMLODIPINE (NORVASC) 5 MG TABLET    Take 5 mg by mouth every morning.   ATORVASTATIN (LIPITOR) 20 MG TABLET    Take 20 mg by mouth daily.   CETAPHIL (CETAPHIL) CREAM    Apply 1 application topically 2 (two) times daily as needed (for dry skin areas).    CHOLECALCIFEROL (VITAMIN D) 400 UNITS TABS TABLET    Take 800 Units by mouth daily.   DEXLANSOPRAZOLE (DEXILANT) 60 MG CAPSULE    Take 60 mg by mouth every morning.   FLUOCINONIDE-EMOLLIENT (LIDEX-E) 0.05 % CREAM    Apply 1 application topically 3  (three) times daily.   FOOD THICKENER (THICK IT) POWD    Add to thin liquids to make nectar thickened   MEMANTINE (NAMENDA) 10 MG TABLET    Take 10 mg by mouth 2 (two) times daily.    METFORMIN (GLUCOPHAGE) 500 MG TABLET    Take 500 mg by mouth at bedtime.   METOPROLOL TARTRATE (LOPRESSOR) 25 MG TABLET    Take 1 tablet (25 mg total) by mouth 2 (two) times daily.   QUETIAPINE (SEROQUEL) 25 MG TABLET    Take 25 mg by mouth at bedtime.   TRAMADOL (ULTRAM) 50 MG TABLET    Take 1 tablet (50 mg total) by mouth 4 (four) times daily.    TRIAMCINOLONE CREAM (KENALOG) 0.1 %    Apply 1 application topically every 12 (twelve) hours as needed (for dry skin areas).   VITAMINS A & D (VITAMIN A & D) OINTMENT    Apply 1 application topically 3 (three) times daily.  Modified Medications   No medications on file  Discontinued Medications   No medications on file     SIGNIFICANT DIAGNOSTIC EXAMS   04-19-15: chest x-ray: No active disease.   LABS REVIEWED:   04-19-15; wbc 18.7;  hgb 15.8; hct 48.7; mcv 93.5; plt 409; glucose 184; bun 27; creat 1.19; k+ 3.8; na++151; t protein 8.8; albumin 3.4; ast 48; alt 34; alk phos 132; t bili 1.4  blood and urine culture: no growth  04-20-15; wbc 18.5; hgb 14.4; hct 44.9; mcv 93.7; plt 384; glucose 133; bun 19; creat 1.03; k+ 2.9; na++153; t protein 7.6; albumin 2.9; ast 46; alt 33; alk phos 122; t bili 1.3; tsh 0.976; hgb a1c 6.8  mag 1.8 chol 123; ldl 64; trig 117; hdl 36 04-25-15: wbc 15.3; hgb 12.45; hct 37.7; mcv 88.9 ;plt 295; glucose 145; bun 8; creat 0.88; k+ 3.7; na++137; mag 1.9  05-01-15: wbc 13.5; hgb 15.1; hct 51.0; mcv 90.4; plt 584; glucose 142; bun 11; creat 0.79; k+ 4.6; na++141     Review of Systems  Unable to perform ROS: other     Physical Exam  Constitutional: No distress.  Frail   Eyes: Conjunctivae are normal.  Neck: Neck supple. No JVD present. No thyromegaly present.  Cardiovascular: Normal rate, regular rhythm and intact distal pulses.    Respiratory: Effort normal and breath sounds normal. No respiratory distress. He has no wheezes.  GI: Soft. Bowel sounds are normal. He exhibits no distension. There is no tenderness.  Musculoskeletal: He exhibits no edema.  Able to move all extremities  Right aka   Lymphadenopathy:    He has no cervical adenopathy.  Neurological: He is alert.  Skin: Skin is warm and dry. He is not diaphoretic.  Right aka stump incision line with staples intact there is less inflammation present there is no drainage present. Area does remain tender to touch Psychiatric: He has a normal mood and affect.       ASSESSMENT/ PLAN:  1. Cellulitis of incision line: will continue doxycycline 100 gm twice daily to complete  3 weeks with florastor twice daily for 3 weeks.  Will continue ultram 50 mg every 6 hours routinely for pain management   2. Diabetes: due to his poor appetite will stop the metformin at this time; his hgb a1c is 6.8  3. Hypertension: will continue norvasc 5 mg daily lopressor 25 mg twice daily   4. GERD: will continue dexilant 60 mg daily   5. Dyslipidemia: will stop the lipitor due to his poor appetite will monitor  6. Dysphagia: no signs of aspiration present; will continue nectar thick liquids; will monitor  7. Dementia: his appetite remains poor will continue namenda 10 mg twice daily; will start remeron 7.5 mg nightly for 30 days and will monitor  8. FTT: his current weight is 100 pounds; will start remeron 7.5 mg nightly to help with appetite and will monitor his status.   9. Psychosis: will continue seroquel 25 mg nightly     Will check cbc; cmp      Ok Edwards NP [facility]  Contact [phone] Monday through Friday 8am- 5pm  After hours call [phone]

## 2015-06-23 ENCOUNTER — Encounter: Payer: Self-pay | Admitting: Vascular Surgery

## 2015-06-28 ENCOUNTER — Non-Acute Institutional Stay (SKILLED_NURSING_FACILITY): Payer: Medicare Other | Admitting: Adult Health

## 2015-06-28 ENCOUNTER — Encounter: Payer: Self-pay | Admitting: Adult Health

## 2015-06-28 ENCOUNTER — Encounter: Payer: Medicare Other | Admitting: Vascular Surgery

## 2015-06-28 DIAGNOSIS — E1151 Type 2 diabetes mellitus with diabetic peripheral angiopathy without gangrene: Secondary | ICD-10-CM

## 2015-06-28 DIAGNOSIS — I739 Peripheral vascular disease, unspecified: Secondary | ICD-10-CM | POA: Diagnosis not present

## 2015-06-28 DIAGNOSIS — I10 Essential (primary) hypertension: Secondary | ICD-10-CM

## 2015-06-28 DIAGNOSIS — R1310 Dysphagia, unspecified: Secondary | ICD-10-CM

## 2015-06-28 DIAGNOSIS — E785 Hyperlipidemia, unspecified: Secondary | ICD-10-CM | POA: Diagnosis not present

## 2015-06-28 DIAGNOSIS — F0391 Unspecified dementia with behavioral disturbance: Secondary | ICD-10-CM

## 2015-06-28 DIAGNOSIS — F03918 Unspecified dementia, unspecified severity, with other behavioral disturbance: Secondary | ICD-10-CM

## 2015-06-28 DIAGNOSIS — E43 Unspecified severe protein-calorie malnutrition: Secondary | ICD-10-CM

## 2015-06-28 DIAGNOSIS — K219 Gastro-esophageal reflux disease without esophagitis: Secondary | ICD-10-CM

## 2015-06-28 NOTE — Progress Notes (Signed)
Patient ID: Jonathan Richards, male   DOB: 02-Nov-1933, 82 y.o.   MRN: [id number]    Facility:  [facility]      Allergies  Allergen Reactions  . Asa [Aspirin] Other (See Comments)    Per daughter, pt is not allergic to ASA, but chart has this listed as an allergy.    Chief Complaint  Patient presents with  . Medical Management of Chronic Issues    HPI:  He is a long term resident of this facility being seen for the management of his chronic illnesses. Staff reports that he is eating since stopping his antipsychotic medication. He is unable to participate in the hpi or ros. There are no nursing concerns at this time. His current weight 100 pounds.    Past Medical History  Diagnosis Date  . Diabetes mellitus without complication ([place])   . Hypertension   . Alzheimer disease   . CAD (coronary artery disease)   . Stroke [facility])     Past Surgical History  Procedure Laterality Date  . Coronary artery bypass graft    . Amputation Right 04/24/2015    Procedure: AMPUTATION ABOVE KNEE;  Surgeon: Elam Dutch, MD;  Location: [facility] OR;  Service: Vascular;  Laterality: Right;    VITAL SIGNS BP 113/83 mmHg  Pulse 90  Ht 5' 6"  (1.676 m)  Wt 100 lb (45.36 kg)  BMI 16.15 kg/m2  SpO2 98%  Patient's Medications  New Prescriptions   No medications on file  Previous Medications   CHOLECALCIFEROL (VITAMIN D) 400 UNITS TABS TABLET    Take 800 Units by mouth daily.   DEXLANSOPRAZOLE (DEXILANT) 60 MG CAPSULE    Take 60 mg by mouth every morning.   FOOD THICKENER (THICK IT) POWD    Add to thin liquids to make nectar thickened   GABAPENTIN (NEURONTIN) 100 MG CAPSULE    Take 1 capsule (100 mg total) by mouth at bedtime.   MEMANTINE (NAMENDA) 10 MG TABLET    Take 10 mg by mouth 2 (two) times daily.    METOPROLOL TARTRATE (LOPRESSOR) 25 MG TABLET    Take 1 tablet (25 mg total) by mouth 2 (two) times daily.   NUTRITIONAL SUPPLEMENTS (NUTRITIONAL SUPPLEMENT PO)    Take 90 mLs by mouth 3 (three) times  daily. 2-cal   OXYCODONE (ROXICODONE) 5 MG IMMEDIATE RELEASE TABLET    Take 1 tablet (5 mg total) by mouth every 6 (six) hours.   TRIAMCINOLONE CREAM (KENALOG) 0.1 %    Apply 1 application topically every 12 (twelve) hours as needed (for dry skin areas).  Modified Medications   No medications on file  Discontinued Medications     SIGNIFICANT DIAGNOSTIC EXAMS   04-19-15: chest x-ray: No active disease.   LABS REVIEWED:   04-19-15; wbc 18.7; hgb 15.8; hct 48.7; mcv 93.5; plt 409; glucose 184; bun 27; creat 1.19; k+ 3.8; na++151; t protein 8.8; albumin 3.4; ast 48; alt 34; alk phos 132; t bili 1.4  blood and urine culture: no growth  04-20-15; wbc 18.5; hgb 14.4; hct 44.9; mcv 93.7; plt 384; glucose 133; bun 19; creat 1.03; k+ 2.9; na++153; t protein 7.6; albumin 2.9; ast 46; alt 33; alk phos 122; t bili 1.3; tsh 0.976; hgb a1c 6.8  mag 1.8 chol 123; ldl 64; trig 117; hdl 36 04-25-15: wbc 15.3; hgb 12.45; hct 37.7; mcv 88.9 ;plt 295; glucose 145; bun 8; creat 0.88; k+ 3.7; na++137; mag 1.9  05-01-15: wbc 13.5; hgb 15.1; hct  51.0; mcv 90.4; plt 584; glucose 142; bun 11; creat 0.79; k+ 4.6; na++141  05-24-15: wbc 11.8; hgb 13.2; hct 42.9; mcv 90.4; plt 393; glucose 93; bun 11.2; creat 0.73; k+ 4.3; na++141; liver normal albumin 2.7     Review of Systems  Unable to perform ROS: other     Physical Exam  Constitutional: No distress.  Frail   Eyes: Conjunctivae are normal.  Neck: Neck supple. No JVD present. No thyromegaly present.  Cardiovascular: Normal rate, regular rhythm and intact distal pulses.   Respiratory: Effort normal and breath sounds normal. No respiratory distress. He has no wheezes.  GI: Soft. Bowel sounds are normal. He exhibits no distension. There is no tenderness.  Musculoskeletal: He exhibits no edema.  Able to move all extremities  Right aka   Lymphadenopathy:    He has no cervical adenopathy.  Neurological: He is alert.  Skin: Skin is warm and dry. He is not  diaphoretic.  Right aka stump incision line with staples intact there is less inflammation present there is no drainage present. Area does remain tender to touch Psychiatric: He has a normal mood and affect.       ASSESSMENT/ PLAN:  1. Diabetes:  his hgb a1c is 6.8 he is presently not on medications; will not make changes will monitor  2. Hypertension: will continue  lopressor 25 mg twice daily   3. GERD: will continue dexilant 60 mg daily   4. Dyslipidemia: is presently not on medications; will not make changes   5. Dysphagia: no signs of aspiration present; will continue nectar thick liquids; will monitor  6. Dementia: will continue his namenda 10 mg twice daily; his weight is presently without change at 100 pounds.   7. FTT: his current weight is 100 pounds; will continue supplements per facility protocol; his albumin 2.7   8. Psychosis: his seroquel has been stopped; there are no reports of behavioral issues present.   9. PAD: is status post right aka; has oxycodone 5 mg every 6 hours as needed for pain will monitor       Ok Edwards NP [facility]  Contact [phone] Monday through Friday 8am- 5pm  After hours call [phone]

## 2015-06-29 ENCOUNTER — Encounter: Payer: Medicare Other | Admitting: Vascular Surgery

## 2015-07-26 ENCOUNTER — Non-Acute Institutional Stay (SKILLED_NURSING_FACILITY): Payer: Medicare Other | Admitting: Adult Health

## 2015-07-26 ENCOUNTER — Encounter: Payer: Self-pay | Admitting: Adult Health

## 2015-07-26 DIAGNOSIS — E785 Hyperlipidemia, unspecified: Secondary | ICD-10-CM

## 2015-07-26 DIAGNOSIS — R1310 Dysphagia, unspecified: Secondary | ICD-10-CM | POA: Diagnosis not present

## 2015-07-26 DIAGNOSIS — I739 Peripheral vascular disease, unspecified: Secondary | ICD-10-CM

## 2015-07-26 DIAGNOSIS — E1151 Type 2 diabetes mellitus with diabetic peripheral angiopathy without gangrene: Secondary | ICD-10-CM

## 2015-07-26 DIAGNOSIS — R627 Adult failure to thrive: Secondary | ICD-10-CM

## 2015-07-26 DIAGNOSIS — E43 Unspecified severe protein-calorie malnutrition: Secondary | ICD-10-CM | POA: Diagnosis not present

## 2015-07-26 DIAGNOSIS — K219 Gastro-esophageal reflux disease without esophagitis: Secondary | ICD-10-CM

## 2015-07-26 DIAGNOSIS — I10 Essential (primary) hypertension: Secondary | ICD-10-CM

## 2015-07-26 DIAGNOSIS — F0391 Unspecified dementia with behavioral disturbance: Secondary | ICD-10-CM | POA: Diagnosis not present

## 2015-07-26 DIAGNOSIS — F03918 Unspecified dementia, unspecified severity, with other behavioral disturbance: Secondary | ICD-10-CM

## 2015-07-26 MED ORDER — NUTRITIONAL SUPPLEMENT PO LIQD: 120.0000 mL | Freq: Three times a day (TID) | ORAL | Status: AC

## 2015-07-26 MED ORDER — DEXLANSOPRAZOLE 30 MG PO CPDR: 30.0000 mg | DELAYED_RELEASE_CAPSULE | Freq: Every day | ORAL | Status: AC

## 2015-07-26 NOTE — Progress Notes (Signed)
Patient ID: Jonathan Richards, male   DOB: March 21, 1934, 82 y.o.   MRN: [id number]    Facility:  [facility]       Allergies  Allergen Reactions  . Asa [Aspirin] Other (See Comments)    Per daughter, pt is not allergic to ASA, but chart has this listed as an allergy.    Chief Complaint  Patient presents with  . Medical Management of Chronic Issues    HPI:  He is a long term resident of this facility being seen for the management of his chronic illnesses. He is unable to participate in the hpi or ros. There are no nursing concerns at this time. Left 4th toe is red and inflamed with possible tophi present there are no open areas present. There are no indications of pain present; there are no reports of fever present. His last body temp was low. His last documented weight in Nov was 100 pounds. He looks frail; I am not certain if he has lost further weight.    Past Medical History  Diagnosis Date  . Diabetes mellitus without complication ([place])   . Hypertension   . Alzheimer disease   . CAD (coronary artery disease)   . Stroke [facility])     Past Surgical History  Procedure Laterality Date  . Coronary artery bypass graft    . Amputation Right 04/24/2015    Procedure: AMPUTATION ABOVE KNEE;  Surgeon: Elam Dutch, MD;  Location: [facility] OR;  Service: Vascular;  Laterality: Right;    VITAL SIGNS BP 133/76 mmHg  Pulse 77  Ht 5' 6"  (1.676 m)  Wt 100 lb (45.36 kg)  BMI 16.15 kg/m2  SpO2 98%  Patient's Medications  New Prescriptions   No medications on file  Previous Medications   CHOLECALCIFEROL (VITAMIN D) 400 UNITS TABS TABLET    Take 800 Units by mouth daily.   DEXLANSOPRAZOLE (DEXILANT) 60 MG CAPSULE    Take 60 mg by mouth every morning.   FOOD THICKENER (THICK IT) POWD    Add to thin liquids to make nectar thickened   GABAPENTIN (NEURONTIN) 100 MG CAPSULE    Take 1 capsule (100 mg total) by mouth at bedtime.   MEMANTINE (NAMENDA) 10 MG TABLET    Take 10 mg by mouth 2 (two) times  daily.    METOPROLOL TARTRATE (LOPRESSOR) 25 MG TABLET    Take 1 tablet (25 mg total) by mouth 2 (two) times daily.   NUTRITIONAL SUPPLEMENTS (NUTRITIONAL SUPPLEMENT PO)    Take 90 mLs by mouth 3 (three) times daily. 2-cal   OXYCODONE (OXY IR/ROXICODONE) 5 MG IMMEDIATE RELEASE TABLET    Take 5 mg by mouth every 6 (six) hours as needed for severe pain.   OXYCODONE (ROXICODONE) 5 MG IMMEDIATE RELEASE TABLET    Take 1 tablet (5 mg total) by mouth every 6 (six) hours.   TRIAMCINOLONE CREAM (KENALOG) 0.1 %    Apply 1 application topically every 12 (twelve) hours as needed (for dry skin areas).  Modified Medications   No medications on file  Discontinued Medications   No medications on file     SIGNIFICANT DIAGNOSTIC EXAMS   04-19-15: chest x-ray: No active disease.   LABS REVIEWED:   04-19-15; wbc 18.7; hgb 15.8; hct 48.7; mcv 93.5; plt 409; glucose 184; bun 27; creat 1.19; k+ 3.8; na++151; t protein 8.8; albumin 3.4; ast 48; alt 34; alk phos 132; t bili 1.4  blood and urine culture: no growth  04-20-15; wbc 18.5;  hgb 14.4; hct 44.9; mcv 93.7; plt 384; glucose 133; bun 19; creat 1.03; k+ 2.9; na++153; t protein 7.6; albumin 2.9; ast 46; alt 33; alk phos 122; t bili 1.3; tsh 0.976; hgb a1c 6.8  mag 1.8 chol 123; ldl 64; trig 117; hdl 36 04-25-15: wbc 15.3; hgb 12.45; hct 37.7; mcv 88.9 ;plt 295; glucose 145; bun 8; creat 0.88; k+ 3.7; na++137; mag 1.9  05-01-15: wbc 13.5; hgb 15.1; hct 51.0; mcv 90.4; plt 584; glucose 142; bun 11; creat 0.79; k+ 4.6; na++141  05-24-15: wbc 11.8; hgb 13.2; hct 42.9; mcv 90.4; plt 393; glucose 93; bun 11.2; creat 0.73; k+ 4.3; na++141; liver normal albumin 2.7     Review of Systems  Unable to perform ROS: other     Physical Exam  Constitutional: No distress.  Frail   Eyes: Conjunctivae are normal.  Neck: Neck supple. No JVD present. No thyromegaly present.  Cardiovascular: Normal rate, regular rhythm and intact distal pulses.   Respiratory: Effort normal  and breath sounds normal. No respiratory distress. He has no wheezes.  GI: Soft. Bowel sounds are normal. He exhibits no distension. There is no tenderness.  Musculoskeletal: He exhibits no edema.  Able to move all extremities  Right aka   Lymphadenopathy:    He has no cervical adenopathy.  Neurological: He is alert.  Skin: Skin is warm and dry. He is not diaphoretic.  Right aka stump without signs of infection present Left fourth toe is red hot swollen with possible tophi present. There is no skin breakdown present.  Pedal pulse is faint  Psychiatric: He has a normal mood and affect.       ASSESSMENT/ PLAN:  1. Diabetes:  his hgb a1c is 6.8 he is presently not on medications; will not make changes will monitor  2. Hypertension: will continue  lopressor 25 mg twice daily   3. GERD: will lower dexilant to 30 mg dailly   4. Dyslipidemia: is presently not on medications; will not make changes   5. Dysphagia: no signs of aspiration present; will continue nectar thick liquids; will monitor  6. Dementia: will continue his namenda 10 mg twice daily; his weight is presently without change at 100 pounds.   7. FTT: his current weight is 100 pounds; his albumin 2.7 will increase his supplement to 120 cc three times daily   8. Psychosis: his seroquel has been stopped; there are no reports of behavioral issues present.   9. PAD: is status post right aka; has oxycodone 5 mg every 6 hours and every 6 hours as needed for pain will monitor   10. Gout versus cellulitis: will check uric acid level; will begin doxycycline 100 mg twice daily for 14 days with florastor.    Will check uric acid; cbc; cmp; mag vit b12      Ok Edwards NP [facility]  Contact [phone] Monday through Friday 8am- 5pm  After hours call [phone]

## 2015-08-02 ENCOUNTER — Encounter: Payer: Self-pay | Admitting: Adult Health

## 2015-08-02 NOTE — Progress Notes (Signed)
Patient ID: Jonathan Richards, male   DOB: [birth year], 82 y.o.   MRN: [id number]  Facility: [facility]       Allergies  Allergen Reactions  . Asa [Aspirin] Other (See Comments)    Per daughter, pt is not allergic to ASA, but chart has this listed as an allergy.    Chief Complaint  Patient presents with  . Acute Visit    Follow up    HPI:    Past Medical History  Diagnosis Date  . Diabetes mellitus without complication ([place])   . Hypertension   . Alzheimer disease   . CAD (coronary artery disease)   . Stroke [facility]     Past Surgical History  Procedure Laterality Date  . Coronary artery bypass graft    . Amputation Right 04/24/2015    Procedure: AMPUTATION ABOVE KNEE;  Surgeon: Elam Dutch, MD;  Location: [facility] OR;  Service: Vascular;  Laterality: Right;    VITAL SIGNS BP 120/68 mmHg  Pulse 60  Temp(Src) 98 F (36.7 C) (Oral)  Resp 17  Ht _0  (1.676 m)  Wt 101 lb (45.813 kg)  BMI 16.31 kg/m2  SpO2 98%  Patient's Medications  New Prescriptions   No medications on file  Previous Medications   CHOLECALCIFEROL (VITAMIN D) 400 UNITS TABS TABLET    Take 800 Units by mouth daily.   DEXLANSOPRAZOLE (DEXILANT) 30 MG CAPSULE    Take 1 capsule (30 mg total) by mouth daily.   DOXYCYCLINE (VIBRAMYCIN) 100 MG CAPSULE    Take 100 mg by mouth 2 (two) times daily. For left 4th toe for 14 days with florastor   FOOD THICKENER (THICK IT) POWD    Add to thin liquids to make nectar thickened   GABAPENTIN (NEURONTIN) 100 MG CAPSULE    Take 1 capsule (100 mg total) by mouth at bedtime.   LACTOBACILLUS (ACIDOPHILUS) TABS    Take 1 tablet by mouth 2 (two) times daily. For two weeks   MEMANTINE (NAMENDA) 10 MG TABLET    Take 10 mg by mouth 2 (two) times daily.    METOPROLOL TARTRATE (LOPRESSOR) 25 MG TABLET    Take 1 tablet (25 mg total) by mouth 2 (two) times daily.   NUTRITIONAL SUPPLEMENT LIQD    Take 120 mLs by mouth 3 (three) times daily.   OXYCODONE (OXY IR/ROXICODONE) 5 MG IMMEDIATE  RELEASE TABLET    Take 5 mg by mouth every 6 (six) hours as needed for severe pain.   OXYCODONE (ROXICODONE) 5 MG IMMEDIATE RELEASE TABLET    Take 1 tablet (5 mg total) by mouth every 6 (six) hours.   TRIAMCINOLONE CREAM (KENALOG) 0.1 %    Apply 1 application topically every 12 (twelve) hours as needed (for dry skin areas).  Modified Medications   No medications on file  Discontinued Medications   No medications on file     SIGNIFICANT DIAGNOSTIC EXAMS 04-19-15: chest x-ray: No active disease.   LABS REVIEWED:   04-19-15; wbc 18.7; hgb 15.8; hct 48.7; mcv 93.5; plt 409; glucose 184; bun 27; creat 1.19; k+ 3.8; na++151; t protein 8.8; albumin 3.4; ast 48; alt 34; alk phos 132; t bili 1.4  blood and urine culture: no growth  04-20-15; wbc 18.5; hgb 14.4; hct 44.9; mcv 93.7; plt 384; glucose 133; bun 19; creat 1.03; k+ 2.9; na++153; t protein 7.6; albumin 2.9; ast 46; alt 33; alk phos 122; t bili 1.3; tsh 0.976; hgb a1c 6.8  mag 1.8 chol 123;  ldl 64; trig 117; hdl 36 04-25-15: wbc 15.3; hgb 12.45; hct 37.7; mcv 88.9 ;plt 295; glucose 145; bun 8; creat 0.88; k+ 3.7; na++137; mag 1.9  05-01-15: wbc 13.5; hgb 15.1; hct 51.0; mcv 90.4; plt 584; glucose 142; bun 11; creat 0.79; k+ 4.6; na++141  05-24-15: wbc 11.8; hgb 13.2; hct 42.9; mcv 90.4; plt 393; glucose 93; bun 11.2; creat 0.73; k+ 4.3; na++141; liver normal albumin 2.7     Review of Systems  Unable to perform ROS: other     Physical Exam  Constitutional: No distress.  Frail   Eyes: Conjunctivae are normal.  Neck: Neck supple. No JVD present. No thyromegaly present.  Cardiovascular: Normal rate, regular rhythm and intact distal pulses.   Respiratory: Effort normal and breath sounds normal. No respiratory distress. He has no wheezes.  GI: Soft. Bowel sounds are normal. He exhibits no distension. There is no tenderness.  Musculoskeletal: He exhibits no edema.  Able to move all extremities  Right aka   Lymphadenopathy:    He has no  cervical adenopathy.  Neurological: He is alert.  Skin: Skin is warm and dry. He is not diaphoretic.  Right aka stump without signs of infection present Left fourth toe is red hot swollen with possible tophi present. There is no skin breakdown present.  Pedal pulse is faint  Psychiatric: He has a normal mood and affect.       ASSESSMENT/ PLAN:  1. Diabetes:  his hgb a1c is 6.8 he is presently not on medications; will not make changes will monitor  2. Hypertension: will continue  lopressor 25 mg twice daily   3. GERD: will lower dexilant to 30 mg dailly   4. Dyslipidemia: is presently not on medications; will not make changes   5. Dysphagia: no signs of aspiration present; will continue nectar thick liquids; will monitor  6. Dementia: will continue his namenda 10 mg twice daily; his weight is presently without change at 100 pounds.   7. FTT: his current weight is 100 pounds; his albumin 2.7 will increase his supplement to 120 cc three times daily   8. Psychosis: his seroquel has been stopped; there are no reports of behavioral issues present.   9. PAD: is status post right aka; has oxycodone 5 mg every 6 hours and every 6 hours as needed for pain will monitor   10. Gout versus cellulitis: will check uric acid level; will begin doxycycline 100 mg twice daily for 14 days with florastor.    Will check uric acid; cbc; cmp; mag vit b12               Ok Edwards NP [facility]  Contact [phone] Monday through Friday 8am- 5pm  After hours call [phone]

## 2015-08-18 ENCOUNTER — Emergency Department (HOSPITAL_COMMUNITY): Payer: Medicare Other

## 2015-08-18 ENCOUNTER — Inpatient Hospital Stay (HOSPITAL_COMMUNITY)
Admission: EM | Admit: 2015-08-18 | Discharge: 2015-09-16 | DRG: 871 | Disposition: E | Payer: Medicare Other | Attending: Internal Medicine | Admitting: Internal Medicine

## 2015-08-18 ENCOUNTER — Encounter (HOSPITAL_COMMUNITY): Payer: Self-pay

## 2015-08-18 ENCOUNTER — Inpatient Hospital Stay (HOSPITAL_COMMUNITY): Payer: Medicare Other

## 2015-08-18 DIAGNOSIS — F0391 Unspecified dementia with behavioral disturbance: Secondary | ICD-10-CM | POA: Diagnosis not present

## 2015-08-18 DIAGNOSIS — K5909 Other constipation: Secondary | ICD-10-CM | POA: Diagnosis present

## 2015-08-18 DIAGNOSIS — F0281 Dementia in other diseases classified elsewhere with behavioral disturbance: Secondary | ICD-10-CM | POA: Diagnosis present

## 2015-08-18 DIAGNOSIS — Z79899 Other long term (current) drug therapy: Secondary | ICD-10-CM | POA: Diagnosis not present

## 2015-08-18 DIAGNOSIS — G309 Alzheimer's disease, unspecified: Secondary | ICD-10-CM | POA: Diagnosis present

## 2015-08-18 DIAGNOSIS — F03918 Unspecified dementia, unspecified severity, with other behavioral disturbance: Secondary | ICD-10-CM | POA: Diagnosis present

## 2015-08-18 DIAGNOSIS — R0902 Hypoxemia: Secondary | ICD-10-CM

## 2015-08-18 DIAGNOSIS — Z681 Body mass index (BMI) 19 or less, adult: Secondary | ICD-10-CM | POA: Diagnosis not present

## 2015-08-18 DIAGNOSIS — E43 Unspecified severe protein-calorie malnutrition: Secondary | ICD-10-CM | POA: Diagnosis present

## 2015-08-18 DIAGNOSIS — Z8249 Family history of ischemic heart disease and other diseases of the circulatory system: Secondary | ICD-10-CM

## 2015-08-18 DIAGNOSIS — I2510 Atherosclerotic heart disease of native coronary artery without angina pectoris: Secondary | ICD-10-CM | POA: Diagnosis present

## 2015-08-18 DIAGNOSIS — Z951 Presence of aortocoronary bypass graft: Secondary | ICD-10-CM

## 2015-08-18 DIAGNOSIS — Z66 Do not resuscitate: Secondary | ICD-10-CM | POA: Diagnosis present

## 2015-08-18 DIAGNOSIS — R64 Cachexia: Secondary | ICD-10-CM | POA: Diagnosis present

## 2015-08-18 DIAGNOSIS — Z4659 Encounter for fitting and adjustment of other gastrointestinal appliance and device: Secondary | ICD-10-CM

## 2015-08-18 DIAGNOSIS — A419 Sepsis, unspecified organism: Principal | ICD-10-CM | POA: Diagnosis present

## 2015-08-18 DIAGNOSIS — L89159 Pressure ulcer of sacral region, unspecified stage: Secondary | ICD-10-CM | POA: Diagnosis present

## 2015-08-18 DIAGNOSIS — J690 Pneumonitis due to inhalation of food and vomit: Secondary | ICD-10-CM | POA: Diagnosis present

## 2015-08-18 DIAGNOSIS — E1151 Type 2 diabetes mellitus with diabetic peripheral angiopathy without gangrene: Secondary | ICD-10-CM | POA: Diagnosis present

## 2015-08-18 DIAGNOSIS — Z89611 Acquired absence of right leg above knee: Secondary | ICD-10-CM | POA: Diagnosis not present

## 2015-08-18 DIAGNOSIS — N179 Acute kidney failure, unspecified: Secondary | ICD-10-CM | POA: Diagnosis present

## 2015-08-18 DIAGNOSIS — I10 Essential (primary) hypertension: Secondary | ICD-10-CM | POA: Diagnosis present

## 2015-08-18 DIAGNOSIS — R6521 Severe sepsis with septic shock: Secondary | ICD-10-CM | POA: Diagnosis present

## 2015-08-18 DIAGNOSIS — Z79891 Long term (current) use of opiate analgesic: Secondary | ICD-10-CM | POA: Diagnosis not present

## 2015-08-18 DIAGNOSIS — Z515 Encounter for palliative care: Secondary | ICD-10-CM | POA: Insufficient documentation

## 2015-08-18 DIAGNOSIS — R0602 Shortness of breath: Secondary | ICD-10-CM | POA: Diagnosis present

## 2015-08-18 DIAGNOSIS — I739 Peripheral vascular disease, unspecified: Secondary | ICD-10-CM | POA: Diagnosis present

## 2015-08-18 DIAGNOSIS — Z8673 Personal history of transient ischemic attack (TIA), and cerebral infarction without residual deficits: Secondary | ICD-10-CM | POA: Diagnosis not present

## 2015-08-18 DIAGNOSIS — J9601 Acute respiratory failure with hypoxia: Secondary | ICD-10-CM | POA: Diagnosis present

## 2015-08-18 DIAGNOSIS — Z886 Allergy status to analgesic agent status: Secondary | ICD-10-CM | POA: Diagnosis not present

## 2015-08-18 DIAGNOSIS — E876 Hypokalemia: Secondary | ICD-10-CM | POA: Diagnosis present

## 2015-08-18 DIAGNOSIS — R627 Adult failure to thrive: Secondary | ICD-10-CM | POA: Diagnosis present

## 2015-08-18 DIAGNOSIS — R6520 Severe sepsis without septic shock: Secondary | ICD-10-CM | POA: Diagnosis present

## 2015-08-18 DIAGNOSIS — L8990 Pressure ulcer of unspecified site, unspecified stage: Secondary | ICD-10-CM | POA: Insufficient documentation

## 2015-08-18 LAB — URINALYSIS, ROUTINE W REFLEX MICROSCOPIC
Bilirubin Urine: NEGATIVE
Glucose, UA: NEGATIVE mg/dL
KETONES UR: NEGATIVE mg/dL
LEUKOCYTES UA: NEGATIVE
NITRITE: NEGATIVE
PH: 7 (ref 5.0–8.0)
PROTEIN: 30 mg/dL — AB
Specific Gravity, Urine: 1.025 (ref 1.005–1.030)

## 2015-08-18 LAB — CBC
HCT: 44.3 % (ref 39.0–52.0)
Hemoglobin: 14.6 g/dL (ref 13.0–17.0)
MCH: 31.3 pg (ref 26.0–34.0)
MCHC: 33 g/dL (ref 30.0–36.0)
MCV: 95.1 fL (ref 78.0–100.0)
PLATELETS: 266 10*3/uL (ref 150–400)
RBC: 4.66 MIL/uL (ref 4.22–5.81)
RDW: 14.2 % (ref 11.5–15.5)
WBC: 23.8 10*3/uL — AB (ref 4.0–10.5)

## 2015-08-18 LAB — PROCALCITONIN: PROCALCITONIN: 4.59 ng/mL

## 2015-08-18 LAB — PROTIME-INR
INR: 1.17 (ref 0.00–1.49)
Prothrombin Time: 15 seconds (ref 11.6–15.2)

## 2015-08-18 LAB — URINE MICROSCOPIC-ADD ON

## 2015-08-18 LAB — LACTIC ACID, PLASMA: Lactic Acid, Venous: 5.7 mmol/L (ref 0.5–2.0)

## 2015-08-18 LAB — COMPREHENSIVE METABOLIC PANEL
ALK PHOS: 84 U/L (ref 38–126)
ALT: <5 U/L — ABNORMAL LOW (ref 17–63)
ANION GAP: 14 (ref 5–15)
AST: 34 U/L (ref 15–41)
Albumin: 3.5 g/dL (ref 3.5–5.0)
BUN: 22 mg/dL — ABNORMAL HIGH (ref 6–20)
CALCIUM: 9.2 mg/dL (ref 8.9–10.3)
CO2: 21 mmol/L — AB (ref 22–32)
Chloride: 105 mmol/L (ref 101–111)
Creatinine, Ser: 1.22 mg/dL (ref 0.61–1.24)
GFR, EST NON AFRICAN AMERICAN: 55 mL/min — AB (ref >60)
Glucose, Bld: 183 mg/dL — ABNORMAL HIGH (ref 65–99)
Potassium: 4 mmol/L (ref 3.5–5.1)
SODIUM: 140 mmol/L (ref 135–145)
TOTAL PROTEIN: 7 g/dL (ref 6.5–8.1)
Total Bilirubin: 0.9 mg/dL (ref 0.3–1.2)

## 2015-08-18 LAB — I-STAT CG4 LACTIC ACID, ED
LACTIC ACID, VENOUS: 7.13 mmol/L — AB (ref 0.5–2.0)
Lactic Acid, Venous: 7.46 mmol/L (ref 0.5–2.0)

## 2015-08-18 LAB — APTT: aPTT: 35 seconds (ref 24–37)

## 2015-08-18 LAB — TSH: TSH: 2.426 u[IU]/mL (ref 0.350–4.500)

## 2015-08-18 MED ORDER — HYDROMORPHONE HCL 1 MG/ML IJ SOLN
0.5000 mg | INTRAMUSCULAR | Status: DC | PRN
  Administered 2015-08-18: 0.5 mg via INTRAVENOUS
  Filled 2015-08-18: qty 1

## 2015-08-18 MED ORDER — VANCOMYCIN HCL IN DEXTROSE 750-5 MG/150ML-% IV SOLN
750.0000 mg | INTRAVENOUS | Status: DC
  Filled 2015-08-18: qty 150

## 2015-08-18 MED ORDER — GABAPENTIN 100 MG PO CAPS: 100.0000 mg | ORAL_CAPSULE | Freq: Every day | ORAL | Status: DC

## 2015-08-18 MED ORDER — ACETAMINOPHEN 325 MG PO TABS: 650.0000 mg | ORAL_TABLET | Freq: Four times a day (QID) | ORAL | Status: DC | PRN

## 2015-08-18 MED ORDER — POLYETHYLENE GLYCOL 3350 17 G PO PACK
17.0000 g | PACK | Freq: Every day | ORAL | Status: DC | PRN
  Filled 2015-08-18: qty 1

## 2015-08-18 MED ORDER — PIPERACILLIN-TAZOBACTAM 3.375 G IVPB
3.3750 g | Freq: Three times a day (TID) | INTRAVENOUS | Status: DC
  Administered 2015-08-19 (×4): 3.375 g via INTRAVENOUS
  Filled 2015-08-18 (×3): qty 50

## 2015-08-18 MED ORDER — INSULIN ASPART 100 UNIT/ML ~~LOC~~ SOLN
0.0000 [IU] | SUBCUTANEOUS | Status: DC
  Administered 2015-08-19: 1 [IU] via SUBCUTANEOUS

## 2015-08-18 MED ORDER — SODIUM CHLORIDE 0.9 % IV SOLN
INTRAVENOUS | Status: AC
  Administered 2015-08-18: 125 mL/h via INTRAVENOUS

## 2015-08-18 MED ORDER — VANCOMYCIN HCL 1000 MG IV SOLR: 20.0000 mg/kg | Freq: Once | INTRAVENOUS | Status: DC

## 2015-08-18 MED ORDER — BISACODYL 10 MG RE SUPP: 10.0000 mg | Freq: Every day | RECTAL | Status: DC | PRN

## 2015-08-18 MED ORDER — SODIUM CHLORIDE 0.9% FLUSH
3.0000 mL | Freq: Two times a day (BID) | INTRAVENOUS | Status: DC
  Administered 2015-08-18 – 2015-08-19 (×3): 3 mL via INTRAVENOUS

## 2015-08-18 MED ORDER — VANCOMYCIN HCL IN DEXTROSE 1-5 GM/200ML-% IV SOLN
1000.0000 mg | Freq: Once | INTRAVENOUS | Status: AC
  Administered 2015-08-18: 1000 mg via INTRAVENOUS
  Filled 2015-08-18: qty 200

## 2015-08-18 MED ORDER — PANTOPRAZOLE SODIUM 40 MG PO TBEC: 40.0000 mg | DELAYED_RELEASE_TABLET | Freq: Every day | ORAL | Status: DC

## 2015-08-18 MED ORDER — METOPROLOL TARTRATE 12.5 MG HALF TABLET: 12.5000 mg | ORAL_TABLET | Freq: Two times a day (BID) | ORAL | Status: DC

## 2015-08-18 MED ORDER — ENSURE ENLIVE PO LIQD
120.0000 mL | Freq: Three times a day (TID) | ORAL | Status: DC
  Filled 2015-08-18 (×2): qty 237

## 2015-08-18 MED ORDER — SODIUM CHLORIDE 0.9 % IV BOLUS (SEPSIS)
30.0000 mL/kg | Freq: Once | INTRAVENOUS | Status: AC
  Administered 2015-08-18: 1350 mL via INTRAVENOUS

## 2015-08-18 MED ORDER — ONDANSETRON HCL 4 MG PO TABS: 4.0000 mg | ORAL_TABLET | Freq: Four times a day (QID) | ORAL | Status: DC | PRN

## 2015-08-18 MED ORDER — PIPERACILLIN-TAZOBACTAM 3.375 G IVPB 30 MIN
3.3750 g | Freq: Once | INTRAVENOUS | Status: AC
  Administered 2015-08-18: 3.375 g via INTRAVENOUS
  Filled 2015-08-18: qty 50

## 2015-08-18 MED ORDER — ACETAMINOPHEN 650 MG RE SUPP
650.0000 mg | Freq: Four times a day (QID) | RECTAL | Status: DC | PRN
  Administered 2015-08-19: 650 mg via RECTAL
  Filled 2015-08-18: qty 1

## 2015-08-18 MED ORDER — OXYCODONE HCL 5 MG PO TABS: 5.0000 mg | ORAL_TABLET | Freq: Four times a day (QID) | ORAL | Status: DC | PRN

## 2015-08-18 MED ORDER — SODIUM CHLORIDE 0.9 % IV BOLUS (SEPSIS)
1000.0000 mL | Freq: Once | INTRAVENOUS | Status: AC
  Administered 2015-08-18: 1000 mL via INTRAVENOUS

## 2015-08-18 MED ORDER — ONDANSETRON HCL 4 MG/2ML IJ SOLN
4.0000 mg | Freq: Four times a day (QID) | INTRAMUSCULAR | Status: DC | PRN
  Administered 2015-08-18: 4 mg via INTRAVENOUS
  Filled 2015-08-18: qty 2

## 2015-08-18 MED ORDER — HEPARIN SODIUM (PORCINE) 5000 UNIT/ML IJ SOLN
5000.0000 [IU] | Freq: Three times a day (TID) | INTRAMUSCULAR | Status: DC
  Administered 2015-08-19 (×3): 5000 [IU] via SUBCUTANEOUS
  Filled 2015-08-18 (×7): qty 1

## 2015-08-18 MED ORDER — TRIAMCINOLONE ACETONIDE 0.1 % EX CREA
1.0000 "application " | TOPICAL_CREAM | Freq: Two times a day (BID) | CUTANEOUS | Status: DC
  Administered 2015-08-19: 1 via TOPICAL
  Filled 2015-08-18: qty 15

## 2015-08-18 MED ORDER — CHOLECALCIFEROL 10 MCG (400 UNIT) PO TABS
800.0000 [IU] | ORAL_TABLET | Freq: Every day | ORAL | Status: DC
  Filled 2015-08-18 (×2): qty 2

## 2015-08-18 MED ORDER — MEMANTINE HCL 5 MG PO TABS
10.0000 mg | ORAL_TABLET | Freq: Two times a day (BID) | ORAL | Status: DC
  Filled 2015-08-18 (×2): qty 1

## 2015-08-18 NOTE — H&P (Signed)
[facility] History and Physical  Jonathan HoffDai V Simerly ZOX:[id number] DOB: 10/30/1933 DOA: 08/25/2015  Referring physician: ED physician PCP: Margit HanksALEXANDER, ANNE D, MD  Specialists:  None listed   Chief Complaint:  Lethargy, respiratory distress  HPI: Jonathan Richards is a 82 y.o. male with PMH of advanced Alzheimer dementia, peripheral arterial disease, hypertension, and diabetes who presents to the ED from his SNF with lethargy and respiratory distress of 1 day duration. Patient has advanced dementia, but at baseline he makes eye contact with visitors to his room, recognizes his family, and occasionally says a few words in his primary language. Per report of SNF personnel, patient was at his baseline yesterday, but was noted to be lethargic today and in respiratory distress with tachypnea and increased work of breathing. Of note, the patient had been admitted to this institution in November 2016 for sepsis attributed to a right foot ulcer. Patient ultimately underwent above-the-knee amputation of the right leg on 04/24/2015 prior to discharge back to SNF. He had reportedly been quite stable at the nursing facility until development of acute illness today. EMS was activated for transport to the hospital and O2 saturation was reportedly 82% on room air on the scene.  In ED, patient was found to have temperature 37.9 C, saturating in the 80s on room air, tachypneic with a rate of 30, tachycardic to 120s, and was stable blood pressure. Chest x-ray was obtained and negative for acute cardiopulmonary disease. EKG feature sinus tachycardia with diffuse repolarization abnormalities. Urine was obtained for analysis and features rare bacteria with negative leukocyte and negative nitrites. Basic blood work is sent off and returns notable for a leukocytosis to 24,000 and lactic acid of 7.13. Serum creatinine is 1.22, up from an apparent baseline of ~0.7. Blood and urine was sent for culture and 2350 cc NS was bolused. Empiric  vancomycin and Zosyn were administered in the emergency department for suspected sepsis of unknown source. Despite the fluid bolus, lactate continued to climb, reaching 7.46 while still in the ED. Patient did remain hemodynamically stable however, and will be admitted to the stepdown unit for ongoing evaluation and management of severe sepsis with etiology not yet known.  Where does patient live?  SNF     Can patient participate in ADLs?  Barely        Review of Systems:  Unable to obtain ROS secondary to patient's clinical condition with dementia and non-verbal state.     Allergy:  Allergies  Allergen Reactions  . Asa [Aspirin] Other (See Comments)    Per daughter, pt is not allergic to ASA, but chart has this listed as an allergy.    Past Medical History  Diagnosis Date  . Diabetes mellitus without complication (HCC)   . Hypertension   . Alzheimer disease   . CAD (coronary artery disease)   . Stroke [facility](HCC)     Past Surgical History  Procedure Laterality Date  . Coronary artery bypass graft    . Amputation Right 04/24/2015    Procedure: AMPUTATION ABOVE KNEE;  Surgeon: Sherren Kernsharles E Fields, MD;  [facility];  Service: Vascular;  Laterality: Right;    Social History:  reports that he has never smoked. He does not have any smokeless tobacco history on file. He reports that he does not drink alcohol or use illicit drugs.  Family History:  Family History  Problem Relation Age of Onset  . Heart attack Son      Prior to Admission medications  Medication Sig Start Date End Date Taking? Authorizing Provider  cholecalciferol (VITAMIN D) 400 UNITS TABS tablet Take 800 Units by mouth daily.   Yes Historical Provider, MD  colchicine 0.6 MG tablet Take 0.6 mg by mouth daily.   Yes Historical Provider, MD  Dexlansoprazole (DEXILANT) 30 MG capsule Take 1 capsule (30 mg total) by mouth daily. 07/26/15  Yes Sharee Holster, NP  gabapentin (NEURONTIN) 100 MG capsule Take 1 capsule (100 mg  total) by mouth at bedtime. 05/25/15  Yes Sharee Holster, NP  memantine (NAMENDA) 10 MG tablet Take 10 mg by mouth 2 (two) times daily.    Yes Historical Provider, MD  metoprolol tartrate (LOPRESSOR) 25 MG tablet Take 1 tablet (25 mg total) by mouth 2 (two) times daily. 04/26/15  Yes Catarina Hartshorn, MD  NUTRITIONAL SUPPLEMENT LIQD Take 120 mLs by mouth 3 (three) times daily. 07/26/15  Yes Sharee Holster, NP  oxyCODONE (OXY IR/ROXICODONE) 5 MG immediate release tablet Take 5 mg by mouth every 6 (six) hours as needed for moderate pain or severe pain.    Yes Historical Provider, MD  oxyCODONE (ROXICODONE) 5 MG immediate release tablet Take 1 tablet (5 mg total) by mouth every 6 (six) hours. 05/25/15  Yes Sharee Holster, NP  triamcinolone cream (KENALOG) 0.1 % Apply 1 application topically every 12 (twelve) hours as needed (for dry skin areas).   Yes Historical Provider, MD  food thickener (THICK IT) POWD Add to thin liquids to make nectar thickened Patient not taking: Reported on 2015-08-19 04/26/15   Catarina Hartshorn, MD    Physical Exam: Filed Vitals:   2015-08-19 1730 Aug 19, 2015 1800 08-19-2015 1900 August 19, 2015 2003  BP: 132/74 154/95 117/72 103/67  Pulse: 127 110  120  Temp:    101.4 F (38.6 C)  TempSrc:    Oral  Resp: 30 27 30 30   Height:    5\' 6"  (1.676 m)  Weight:    45.813 kg (101 lb)  SpO2: 96% 98%  96%   General: In acute respiratory distress with tachypnea and recruitment of neck and abd muscles.  HEENT:       Eyes: PERRL, EOMI, no scleral icterus or conjunctival pallor.       ENT: No discharge from the ears or nose, no pharyngeal ulcers, Oral mucosa dry.        Neck: No JVD, no bruit, no appreciable mass Heme: No cervical adenopathy, no pallor Cardiac: S1/S2, RRR, soft systolic murmur at lower LSB, No gallops or rubs. Pulm: Good air movement bilaterally. Coarse ronchi throughout. Abd: Soft, nondistended, nontender, no rebound pain or gaurding, BS present. Ext: No LE edema bilaterally. S/p right AKA,  no left foot wounds appreciated Musculoskeletal: No gross deformity, no red, hot, swollen joints   Skin:  Erythematous plaques with silvery scale overlie extremities, and trunk, face, scalp to lessor extent  Neuro: Obtunded, arousable with difficulty, PERRL, no gross facial asymmetry, tone symmetric throughout, Babinski down-going on left Psych:  Unable to assess given the clinical scenario.  Labs on Admission:  Basic Metabolic Panel:  Recent Labs Lab August 19, 2015 1438  NA 140  K 4.0  CL 105  CO2 21*  GLUCOSE 183*  BUN 22*  CREATININE 1.22  CALCIUM 9.2   Liver Function Tests:  Recent Labs Lab 08-19-2015 1438  AST 34  ALT <5*  ALKPHOS 84  BILITOT 0.9  PROT 7.0  ALBUMIN 3.5   No results for input(s): LIPASE, AMYLASE in the last 168 hours. No  results for input(s): AMMONIA in the last 168 hours. CBC:  Recent Labs Lab 09/13/2015 1438  WBC 23.8*  HGB 14.6  HCT 44.3  MCV 95.1  PLT 266   Cardiac Enzymes: No results for input(s): CKTOTAL, CKMB, CKMBINDEX, TROPONINI in the last 168 hours.  BNP (last 3 results) No results for input(s): BNP in the last 8760 hours.  ProBNP (last 3 results) No results for input(s): PROBNP in the last 8760 hours.  CBG: No results for input(s): GLUCAP in the last 168 hours.  Radiological Exams on Admission: Dg Chest Port 1 View  08/20/2015  CLINICAL DATA:  Pt with SOB and increased weakness starting today. Facility reports that the patient was normal yesterday and lethargic today. H/o diabetes, HTN, CAD, Stroke, and alzheimers disease. Surgical h/o CABG.Nonsmoker. EXAM: PORTABLE CHEST 1 VIEW COMPARISON:  04/19/2015 FINDINGS: Changes from CABG surgery are stable. Cardiac silhouette is normal in size and configuration. No mediastinal or hilar masses or convincing adenopathy. Clear lungs.  No pleural effusion or pneumothorax. Bony thorax is diffusely demineralized but grossly intact. IMPRESSION: No acute cardiopulmonary disease. Electronically Signed    By: Amie Portland M.D.   On: 08/17/2015 15:58    EKG: Independently reviewed.  Abnormal findings:  Sinus tachycardia (rate 115), repolarization abnormality in diffuse leads  Assessment/Plan  1. Acute hypoxic respiratory failure  - Saturating 82% on rm air at SNF per EMS; 88% on rm air here, improved with 4 Lpm [place]  - CXR is negative for acute cardiopulmonary disease, but suspicion for aspiration pneumonitis remains  - Covering with vanc and Zosyn empirically, blood cultures pending  - Continuous pulse oximetry, titrate FiO2 to maintain sat >92%  - Culture sputum if pt able to provide sample  - Check urine for strep pneumo antigen  - Consider repeat CXR in am if clinical uncertainty remains   2. Severe sepsis - Meets criteria on admission with tachycardia, tachypnea, leukocytosis, elevated lactate, AKI, suspected pulmonary source  - Blood and urine cultures incubating, will follow  - 30 cc/kg NS bolus given in ED, but lactate continued to climb 7.13 -> 7.46 - Source not yet determined, aspiration pneumonia is a leading consideration  - CXR with apparently clear lungs, though ronchorous breath sounds on exam and acute hypoxic resp failure  - UA grossly negative, culture pending  - Consider CT abdomen for further eval given critical illness and lack of clear source  - Has MRSA screen positivity  - Empiric vanc and Zosyn initiated in ED, will continue while awaiting culture data  - Trend lactate, procalcitonin   3. Acute kidney injury  - SCr 1.22 on admission, up from apparent baseline of ~0.7 - Likely a prerenal azotemia in setting of sepsis and poor perfusion; ATN is possible  - Anticipate improvement with IVF resuscitation - Continue IVF with NS at 125 cc/kg overnight  - Repeat chem panel in am  - If fails to resolve as expected, may need extend w/u to include urine studies and imaging   4. Hypertension - Has been at goal here  - Hesitant to continue home metoprolol in setting of  severe sepsis with concern for progression to shock; on the other hand, this introduces risk of catecholamine surge hemodynamic collapse from beta-blocker withdrawal  - Plan to cautiously resume metoprolol tomorrow if condition improving  - Monitor   5. Type II DM - Not currently on medications for this at SNF  - Suspect glycemic control may not be a problem for this  pt any longer  - Follow CBG q4h while NPO, consider d/c or space out to qAM if consistently normal  - Check A1c, pending   6. Dementia  - Advanced Alzheimer disease per report  - Continue Namenda when safely taking PO  - Per chart review and discussion with pt's daughter, Mr. Kotarski has continued progressive decline in functional status   7. PAD  - Underwent right AKA November '16 for non-healing ulcer with sepsis  - Stump looks good, left foot warm and pink with no wounds - Aggressive treatment of sepsis to maintain perfusion     DVT ppx: SQ Heparin     Code Status: Full code Family Communication:  Yes, patient's daughter and son-in-law at bed side Disposition Plan: Admit to inpatient   Date of Service 09/03/2015    Briscoe Deutscher, MD [facility] Pager [phone]  If 7PM-7AM, please contact night-coverage [email or web address] Password [facility] 08/30/2015, 8:48 PM

## 2015-08-18 NOTE — ED Notes (Signed)
MD Adela LankFloyd aware of lab result

## 2015-08-18 NOTE — ED Provider Notes (Addendum)
I have performed a medical screening exam on this patient. He is resting with eyes closed. Heart rate 98, respiratory rate of 18. Ronchorous breath sounds, right greater than left.  Pulse ox 88 on room air. 96 on 2 L. He is nonverbal.  No family accompanies him here. Reportedly was 82% on room air at his care facility. Asked for sepsis labs and chest x-ray as well as rectal temperature, EKG,  and cath urine.  Rolland PorterMark Nishan Ovens, MD Aug 30, 2015 1449  Rolland PorterMark Jameia Makris, MD Aug 30, 2015 1450  Rolland PorterMark Austyn Seier, MD Aug 30, 2015 [phone]

## 2015-08-18 NOTE — ED Notes (Signed)
Bed: WA05 Expected date:  Expected time:  Means of arrival:  Comments: EMS 

## 2015-08-18 NOTE — ED Notes (Addendum)
Per EMS[facility] c/o SO[facility] and increased weakness starting today.  Facility reports that the patient was normal yesterday and lethargic today.  Pt does not speak English and only communicated w/ gestures.  Hx of DM, HTN, alzheimer's disease and stroke.      Pt found to be 82% on RA and 95% 4L [place].

## 2015-08-18 NOTE — ED Notes (Signed)
Attempted in and out cathter and there was no urine return.

## 2015-08-18 NOTE — ED Notes (Addendum)
Pt critical lactate level reported to pt primary rn, [employer] [place] [employer]

## 2015-08-18 NOTE — ED Provider Notes (Addendum)
CSN: [id number]     Arrival date & time 09-12-15  1327 History   First MD Initiated Contact with Patient 09-12-15 1354     Chief Complaint  Patient presents with  . Shortness of Breath  . Weakness     (Consider location/radiation/quality/duration/timing/severity/associated sxs/prior Treatment) Patient is a 82 y.o. male presenting with shortness of breath, weakness, and general illness. The history is provided by the patient and the nursing home.  Shortness of Breath Associated symptoms: no abdominal pain, no chest pain, no fever, no headaches, no rash and no vomiting   Weakness Associated symptoms include shortness of breath. Pertinent negatives include no chest pain, no abdominal pain and no headaches.  Illness Severity:  Severe Onset quality:  Sudden Duration:  1 day Timing:  Constant Progression:  Unchanged Chronicity:  New Associated symptoms: shortness of breath   Associated symptoms: no abdominal pain, no chest pain, no congestion, no diarrhea, no fever, no headaches, no myalgias, no rash and no vomiting    82 yo M with altered mental status and hypoxia. This is noted today by the nursing home. Patient has a history of a stroke and has not been communicated with the family in the past 15 years. Level V caveat patient nonverbal.  Discussed with multiple family members via phone and in person.  Limited hx.  Patient is currently at baseline.   Past Medical History  Diagnosis Date  . Diabetes mellitus without complication (HCC)   . Hypertension   . Alzheimer disease   . CAD (coronary artery disease)   . Stroke [facility])    Past Surgical History  Procedure Laterality Date  . Coronary artery bypass graft    . Amputation Right 04/24/2015    Procedure: AMPUTATION ABOVE KNEE;  Surgeon: Sherren Kerns, MD;  Location: [facility] OR;  Service: Vascular;  Laterality: Right;   Family History  Problem Relation Age of Onset  . Heart attack Son    Social History  Substance Use Topics  .  Smoking status: Never Smoker   . Smokeless tobacco: None  . Alcohol Use: No    Review of Systems  Unable to perform ROS: Patient nonverbal  Constitutional: Negative for fever and chills.  HENT: Negative for congestion and facial swelling.   Eyes: Negative for discharge and visual disturbance.  Respiratory: Positive for shortness of breath.   Cardiovascular: Negative for chest pain and palpitations.  Gastrointestinal: Negative for vomiting, abdominal pain and diarrhea.  Musculoskeletal: Negative for myalgias and arthralgias.  Skin: Negative for color change and rash.  Neurological: Positive for weakness. Negative for tremors, syncope and headaches.  Psychiatric/Behavioral: Negative for confusion and dysphoric mood.      Allergies  Asa  Home Medications   Prior to Admission medications   Medication Sig Start Date End Date Taking? Authorizing Provider  cholecalciferol (VITAMIN D) 400 UNITS TABS tablet Take 800 Units by mouth daily.   Yes Historical Provider, MD  colchicine 0.6 MG tablet Take 0.6 mg by mouth daily.   Yes Historical Provider, MD  Dexlansoprazole (DEXILANT) 30 MG capsule Take 1 capsule (30 mg total) by mouth daily. 07/26/15  Yes Sharee Holster, NP  gabapentin (NEURONTIN) 100 MG capsule Take 1 capsule (100 mg total) by mouth at bedtime. 05/25/15  Yes Sharee Holster, NP  memantine (NAMENDA) 10 MG tablet Take 10 mg by mouth 2 (two) times daily.    Yes Historical Provider, MD  metoprolol tartrate (LOPRESSOR) 25 MG tablet Take 1 tablet (25 mg total)  by mouth 2 (two) times daily. 04/26/15  Yes Catarina Hartshornavid Tat, MD  NUTRITIONAL SUPPLEMENT LIQD Take 120 mLs by mouth 3 (three) times daily. 07/26/15  Yes Sharee Holstereborah S Green, NP  oxyCODONE (OXY IR/ROXICODONE) 5 MG immediate release tablet Take 5 mg by mouth every 6 (six) hours as needed for moderate pain or severe pain.    Yes Historical Provider, MD  oxyCODONE (ROXICODONE) 5 MG immediate release tablet Take 1 tablet (5 mg total) by mouth every  6 (six) hours. 05/25/15  Yes Sharee Holstereborah S Green, NP  triamcinolone cream (KENALOG) 0.1 % Apply 1 application topically every 12 (twelve) hours as needed (for dry skin areas).   Yes Historical Provider, MD  food thickener (THICK IT) POWD Add to thin liquids to make nectar thickened Patient not taking: Reported on 08/26/2015 04/26/15   Onalee Huaavid Tat, MD   BP 165/90 mmHg  Pulse 114  Temp(Src) 100.2 F (37.9 C) (Rectal)  Resp 25  SpO2 89% Physical Exam  Constitutional: He appears cachectic. He appears toxic. He has a sickly appearance.  HENT:  Head: Normocephalic and atraumatic.  Eyes: EOM are normal. Pupils are equal, round, and reactive to light.  Neck: Normal range of motion. Neck supple. No JVD present.  Cardiovascular: Normal rate and regular rhythm.  Exam reveals no gallop and no friction rub.   No murmur heard. Pulmonary/Chest: No respiratory distress. He has no wheezes.  Abdominal: He exhibits no distension. There is no tenderness. There is no rebound and no guarding.  Musculoskeletal: Normal range of motion.  R aka  Neurological: He is unresponsive. GCS eye subscore is 2. GCS verbal subscore is 2. GCS motor subscore is 1.  Stiff upper and lower extremities  Skin: No rash noted. No pallor.  Psychiatric: He has a normal mood and affect. His behavior is normal.  Nursing note and vitals reviewed.   ED Course  .Intubation Date/Time: 08/19/2015 4:27 PM Performed by: Adela LankFLOYD, Idalie Canto Authorized by: Melene PlanFLOYD, Daphnie Venturini Consent: Verbal consent obtained. Risks and benefits: risks, benefits and alternatives were discussed Consent given by: guardian Required items: required blood products, implants, devices, and special equipment available Time out: Immediately prior to procedure a "time out" was called to verify the correct patient, procedure, equipment, support staff and site/side marked as required. Indications: respiratory failure and  airway protection Intubation method: direct Sedatives:  etomidate Paralytic: rocuronium Laryngoscope size: Mac 4 Tube size: 7.5 mm Tube type: cuffed Number of attempts: 1 Ventilation between attempts: BVM Cricoid pressure: no Cords visualized: yes Post-procedure assessment: chest rise and ETCO2 monitor Breath sounds: equal and absent over the epigastrium Cuff inflated: yes ETT to lip: 22 cm Tube secured with: ETT holder and adhesive tape Chest x-ray interpreted by me. Chest x-ray findings: endotracheal tube in appropriate position Patient tolerance: Patient tolerated the procedure well with no immediate complications   (including critical care time) Labs Review Labs Reviewed  CBC - Abnormal; Notable for the following:    WBC 23.8 (*)    All other components within normal limits  I-STAT CG4 LACTIC ACID, ED - Abnormal; Notable for the following:    Lactic Acid, Venous 7.13 (*)    All other components within normal limits  CULTURE, BLOOD (ROUTINE X 2)  CULTURE, BLOOD (ROUTINE X 2)  URINE CULTURE  COMPREHENSIVE METABOLIC PANEL  URINALYSIS, ROUTINE W REFLEX MICROSCOPIC (NOT AT [facility])    Imaging Review No results found. I have personally reviewed and evaluated these images and lab results as part of my medical decision-making.  EKG Interpretation None      MDM   Final diagnoses:  Aspiration pneumonia, unspecified aspiration pneumonia type, unspecified laterality, unspecified part of lung (HCC)  Septic shock (HCC)  Acute respiratory failure with hypoxia (HCC)    82 yo M with multiple medical problems have hemorrhagic complaint of altered mental status and hypoxia. Likely aspiration pneumonia based on history. Limited secondary to patient not being normal. Initial lactate was 7. Code sepsis called, abx, 30cc/kg iv fluids.   Lactic continues to rise.  Given another bolus.  Discussed code status with family, feel that he is still full code.  As he is at baseline will not intubate at this time.  Admit step down.   Patient had  aspiration event while waiting for a bed.  O2 sat into the 60's, bp in the 50's. Improved with iv fluids and nrb mask.  Discussed again with the family want him intubated at this time.  [facility] consulted for admission.   CRITICAL CARE Performed by: Rae Roam   Total critical care time: 76 minutes  Critical care time was exclusive of separately billable procedures and treating other patients.  Critical care was necessary to treat or prevent imminent or life-threatening deterioration.  Critical care was time spent personally by me on the following activities: development of treatment plan with patient and/or surrogate as well as nursing, discussions with consultants, evaluation of patient's response to treatment, examination of patient, obtaining history from patient or surrogate, ordering and performing treatments and interventions, ordering and review of laboratory studies, ordering and review of radiographic studies, pulse oximetry and re-evaluation of patient's condition.   The patients results and plan were reviewed and discussed.   Any x-rays performed were independently reviewed by myself.   Differential diagnosis were considered with the presenting HPI.  Medications  pantoprazole (PROTONIX) EC tablet 40 mg (0 mg Oral Hold 09/04/2015 2240)  NUTRITIONAL SUPPLEMENT LIQD 120 mL (not administered)  oxyCODONE (Oxy IR/ROXICODONE) immediate release tablet 5 mg (not administered)  gabapentin (NEURONTIN) capsule 100 mg (not administered)  cholecalciferol (VITAMIN D) tablet 800 Units (not administered)  metoprolol tartrate (LOPRESSOR) tablet 12.5 mg (not administered)  triamcinolone cream (KENALOG) 0.1 % 1 application (not administered)  memantine (NAMENDA) tablet 10 mg (not administered)  insulin aspart (novoLOG) injection 0-9 Units (not administered)  heparin injection 5,000 Units (not administered)  sodium chloride flush (NS) 0.9 % injection 3 mL (3 mLs Intravenous Given 09/02/2015 2218)   0.9 %  sodium chloride infusion (not administered)  acetaminophen (TYLENOL) tablet 650 mg (not administered)    Or  acetaminophen (TYLENOL) suppository 650 mg (not administered)  HYDROmorphone (DILAUDID) injection 0.5 mg (0.5 mg Intravenous Given 09/11/2015 2239)  polyethylene glycol (MIRALAX / GLYCOLAX) packet 17 g (not administered)  bisacodyl (DULCOLAX) suppository 10 mg (not administered)  ondansetron (ZOFRAN) tablet 4 mg ( Oral See Alternative 08/29/2015 2237)    Or  ondansetron (ZOFRAN) injection 4 mg (4 mg Intravenous Given 08/26/2015 2237)  vancomycin (VANCOCIN) IVPB 1000 mg/200 mL premix (not administered)  vancomycin (VANCOCIN) IVPB 750 mg/150 ml premix (not administered)  piperacillin-tazobactam (ZOSYN) IVPB 3.375 g (not administered)  sodium chloride 0.9 % bolus 1,350 mL (0 mL/kg  45 kg (Order-Specific) Intravenous Stopped 08/20/2015 1638)  piperacillin-tazobactam (ZOSYN) IVPB 3.375 g (0 g Intravenous Stopped 08/29/2015 1626)  vancomycin (VANCOCIN) IVPB 1000 mg/200 mL premix (0 mg Intravenous Stopped 08/23/2015 1905)  sodium chloride 0.9 % bolus 1,000 mL (0 mLs Intravenous Stopped 09/05/2015 2021)  piperacillin-tazobactam (ZOSYN) IVPB 3.375 g (  3.375 g Intravenous New Bag/Given 09/07/2015 2216)    Filed Vitals:   09/09/2015 2003 09/10/2015 2100 08/28/2015 2130 08/30/2015 2214  BP: 103/67 97/75 158/86 98/57  Pulse: 120 114 136 118  Temp: 101.4 F (38.6 C)     TempSrc: Oral     Resp: 30 30 34 30  Height: 5\' 6"  (1.676 m)     Weight: 101 lb (45.813 kg)     SpO2: 96% 94% 93% 93%    Final diagnoses:  Aspiration pneumonia, unspecified aspiration pneumonia type, unspecified laterality, unspecified part of lung (HCC)    Admission/ observation were discussed with the admitting physician, patient and/or family and they are comfortable with the plan.    Melene Plan, DO 08/23/2015 2255  Melene Plan, DO 08/19/15 [phone]

## 2015-08-18 NOTE — Clinical Social Work Note (Signed)
Clinical Social Work Assessment  Patient Details  Name: Jonathan Richards MRN: [id number] Date of Birth: 12/27/1933  Date of referral:  08/28/2015               Reason for consult:   (Patient is from facility. [facility].)                Permission sought to share information with:   (None.) Permission granted to share information::  No  Name::        Agency::     Relationship::     Contact Information:     Housing/Transportation Living arrangements for the past 2 months:  Assisted Living Facility Source of Information:  Adult Children (Son/Snow [phone]) Patient Interpreter Needed:  None Criminal Activity/Legal Involvement Pertinent to Current Situation/Hospitalization:  No - Comment as needed Significant Relationships:  Adult Children Lives with:  Facility Resident Do you feel safe going back to the place where you live?  Yes Need for family participation in patient care:  Yes (Comment) (Son states he is patient's primary support.)  Care giving concerns:  There are no care giving concerns at this time. Son states staff assist patient with ADL's while at facility. According to son, patient is bed bound and cannot walk. Son states patient has 1 leg, which is his L leg.   Social Worker assessment / plan:  CSW attempted to meet with patient at bedside. However, patient was asleep. Son was present. [facility].   Son states patient does not fall often. Son states that patient is bed bound. Son informed CSW that he is patient's primary support.   Son/Snow [phone]    Employment status:  [employer] Insurance information:  Medicare PT Recommendations:  Not assessed at this time Information / Referral to community resources:   (Patient is a resident of facility. )  Patient/Family's Response to care:  Son is aware that patient will be admitted and has no questions for CSW at this time.  Patient/Family's Understanding of and Emotional  Response to Diagnosis, Current Treatment, and Prognosis:  Son has no questions for CSW.  Emotional Assessment Appearance:  Appears stated age Attitude/Demeanor/Rapport:   (Asleep.) Affect (typically observed):   (Unable to assess due to patient being asleep.) Orientation:   (Unable to assess.) Alcohol / Substance use:  Not Applicable Psych involvement (Current and /or in the community):  No (Comment)  Discharge Needs  Concerns to be addressed:  Adjustment to Illness Readmission within the last 30 days:  No Current discharge risk:  None Barriers to Discharge:  No Barriers Identified   Alene MiresWhitaker, Aulton Routt R, LCSW 08/17/2015, 5:48 PM

## 2015-08-18 NOTE — Progress Notes (Signed)
Pharmacy Antibiotic Note  Jonathan Richards is a 82 y.o. male admitted on 09/09/2015 with sepsis.  Pharmacy has been consulted for Vanc/Zosyn dosing.  Plan: Vancomycin 1g IV x 1 then 750mg   IV every 24 hours.  Goal trough 15-20 mcg/mL. Zosyn 3.375g IV q8h (4 hour infusion).  Height: 5\' 6"  (167.6 cm) Weight: 101 lb (45.813 kg) IBW/kg (Calculated) : 63.8  Temp (24hrs), Avg:100.1 F (37.8 C), Min:98.6 F (37 C), Max:101.4 F (38.6 C)   Recent Labs Lab 09/01/2015 1438 09/03/2015 1454 08/16/2015 1748  WBC 23.8*  --   --   CREATININE 1.22  --   --   LATICACIDVEN  --  7.13* 7.46*    Estimated Creatinine Clearance: 31.8 mL/min (by C-G formula based on Cr of 1.22).    Allergies  Allergen Reactions  . Asa [Aspirin] Other (See Comments)    Per daughter, pt is not allergic to ASA, but chart has this listed as an allergy.    Hessie KnowsJustin M Moroni Nester, PharmD, BCPS Pager [phone] 08/19/2015 8:55 PM

## 2015-08-19 ENCOUNTER — Inpatient Hospital Stay (HOSPITAL_COMMUNITY): Payer: Medicare Other

## 2015-08-19 DIAGNOSIS — A419 Sepsis, unspecified organism: Secondary | ICD-10-CM | POA: Diagnosis present

## 2015-08-19 DIAGNOSIS — J9601 Acute respiratory failure with hypoxia: Secondary | ICD-10-CM

## 2015-08-19 DIAGNOSIS — L8990 Pressure ulcer of unspecified site, unspecified stage: Secondary | ICD-10-CM | POA: Insufficient documentation

## 2015-08-19 DIAGNOSIS — R6521 Severe sepsis with septic shock: Secondary | ICD-10-CM

## 2015-08-19 DIAGNOSIS — Z515 Encounter for palliative care: Secondary | ICD-10-CM | POA: Insufficient documentation

## 2015-08-19 LAB — CBC WITH DIFFERENTIAL/PLATELET
BASOS PCT: 0 %
Basophils Absolute: 0 10*3/uL (ref 0.0–0.1)
EOS ABS: 0 10*3/uL (ref 0.0–0.7)
EOS PCT: 0 %
HCT: 39.3 % (ref 39.0–52.0)
HEMOGLOBIN: 12.9 g/dL — AB (ref 13.0–17.0)
LYMPHS ABS: 2.9 10*3/uL (ref 0.7–4.0)
Lymphocytes Relative: 20 %
MCH: 30.6 pg (ref 26.0–34.0)
MCHC: 32.8 g/dL (ref 30.0–36.0)
MCV: 93.1 fL (ref 78.0–100.0)
MONO ABS: 1.5 10*3/uL — AB (ref 0.1–1.0)
MONOS PCT: 10 %
Neutro Abs: 10.1 10*3/uL — ABNORMAL HIGH (ref 1.7–7.7)
Neutrophils Relative %: 70 %
PLATELETS: 259 10*3/uL (ref 150–400)
RBC: 4.22 MIL/uL (ref 4.22–5.81)
RDW: 14.4 % (ref 11.5–15.5)
WBC: 14.5 10*3/uL — ABNORMAL HIGH (ref 4.0–10.5)

## 2015-08-19 LAB — BLOOD GAS, ARTERIAL
ACID-BASE DEFICIT: 5.4 mmol/L — AB (ref 0.0–2.0)
ACID-BASE DEFICIT: 6.7 mmol/L — AB (ref 0.0–2.0)
Bicarbonate: 17.5 mEq/L — ABNORMAL LOW (ref 20.0–24.0)
Bicarbonate: 16.1 mEq/L — ABNORMAL LOW (ref 20.0–24.0)
DRAWN BY: 422461
DRAWN BY: 295031
FIO2: 0.5
FIO2: 1
LHR: 18 {breaths}/min
MECHVT: 500 mL
MECHVT: 500 mL
O2 Saturation: 92.9 %
O2 Saturation: 98.5 %
PATIENT TEMPERATURE: 98.6
PATIENT TEMPERATURE: 98.6
PCO2 ART: 27.4 mmHg — AB (ref 35.0–45.0)
PCO2 ART: 25.5 mmHg — AB (ref 35.0–45.0)
PEEP/CPAP: 5 cmH2O
PEEP/CPAP: 5 cmH2O
PH ART: 7.417 (ref 7.350–7.450)
PO2 ART: 67.6 mmHg — AB (ref 80.0–100.0)
PO2 ART: 129 mmHg — AB (ref 80.0–100.0)
RATE: 18 resp/min
TCO2: 15.9 mmol/L (ref 0–100)
TCO2: 14.6 mmol/L (ref 0–100)
pH, Arterial: 7.422 (ref 7.350–7.450)

## 2015-08-19 LAB — BASIC METABOLIC PANEL
Anion gap: 10 (ref 5–15)
BUN: 15 mg/dL (ref 6–20)
CALCIUM: 8.2 mg/dL — AB (ref 8.9–10.3)
CHLORIDE: 112 mmol/L — AB (ref 101–111)
CO2: 18 mmol/L — AB (ref 22–32)
CREATININE: 0.77 mg/dL (ref 0.61–1.24)
GFR calc non Af Amer: >60 mL/min (ref >60)
GLUCOSE: 142 mg/dL — AB (ref 65–99)
Potassium: 2.9 mmol/L — ABNORMAL LOW (ref 3.5–5.1)
Sodium: 140 mmol/L (ref 135–145)

## 2015-08-19 LAB — GLUCOSE, CAPILLARY
GLUCOSE-CAPILLARY: 97 mg/dL (ref 65–99)
Glucose-Capillary: 96 mg/dL (ref 65–99)
Glucose-Capillary: 130 mg/dL — ABNORMAL HIGH (ref 65–99)

## 2015-08-19 LAB — CBG MONITORING, ED
GLUCOSE-CAPILLARY: 106 mg/dL — AB (ref 65–99)
GLUCOSE-CAPILLARY: 112 mg/dL — AB (ref 65–99)
Glucose-Capillary: 110 mg/dL — ABNORMAL HIGH (ref 65–99)

## 2015-08-19 LAB — LACTIC ACID, PLASMA: Lactic Acid, Venous: 4.9 mmol/L (ref 0.5–2.0)

## 2015-08-19 LAB — POTASSIUM: Potassium: 3.7 mmol/L (ref 3.5–5.1)

## 2015-08-19 LAB — MRSA PCR SCREENING: MRSA BY PCR: NEGATIVE

## 2015-08-19 MED ORDER — POTASSIUM CHLORIDE 20 MEQ/15ML (10%) PO SOLN
40.0000 meq | Freq: Once | ORAL | Status: AC
  Administered 2015-08-19: 40 meq via ORAL
  Filled 2015-08-19: qty 30

## 2015-08-19 MED ORDER — MIDAZOLAM HCL 2 MG/2ML IJ SOLN
1.0000 mg | INTRAMUSCULAR | Status: DC | PRN
  Administered 2015-08-19: 1 mg via INTRAVENOUS
  Filled 2015-08-19 (×2): qty 2

## 2015-08-19 MED ORDER — PANTOPRAZOLE SODIUM 40 MG IV SOLR
40.0000 mg | INTRAVENOUS | Status: DC
  Administered 2015-08-19: 40 mg via INTRAVENOUS
  Filled 2015-08-19: qty 40

## 2015-08-19 MED ORDER — FENTANYL CITRATE (PF) 100 MCG/2ML IJ SOLN
100.0000 ug | Freq: Once | INTRAMUSCULAR | Status: AC
  Administered 2015-08-19: 100 ug via INTRAVENOUS
  Filled 2015-08-19: qty 2

## 2015-08-19 MED ORDER — FENTANYL CITRATE (PF) 100 MCG/2ML IJ SOLN
50.0000 ug | INTRAMUSCULAR | Status: DC | PRN
  Administered 2015-08-19 (×3): 50 ug via INTRAVENOUS
  Filled 2015-08-19 (×2): qty 2

## 2015-08-19 MED ORDER — SODIUM CHLORIDE 0.9 % IV BOLUS (SEPSIS)
1000.0000 mL | Freq: Once | INTRAVENOUS | Status: AC
  Administered 2015-08-19: 1000 mL via INTRAVENOUS

## 2015-08-19 MED ORDER — ROCURONIUM BROMIDE 50 MG/5ML IV SOLN
INTRAVENOUS | Status: DC | PRN
  Administered 2015-08-18 (×2): 100 mg via INTRAVENOUS

## 2015-08-19 MED ORDER — MAGNESIUM CITRATE PO SOLN: 1.0000 | Freq: Once | ORAL | Status: DC

## 2015-08-19 MED ORDER — FENTANYL CITRATE (PF) 100 MCG/2ML IJ SOLN
50.0000 ug | INTRAMUSCULAR | Status: DC | PRN
  Administered 2015-08-19 (×2): 50 ug via INTRAVENOUS
  Filled 2015-08-19 (×5): qty 2

## 2015-08-19 MED ORDER — VANCOMYCIN HCL IN DEXTROSE 1-5 GM/200ML-% IV SOLN
1000.0000 mg | INTRAVENOUS | Status: DC
  Administered 2015-08-19: 1000 mg via INTRAVENOUS
  Filled 2015-08-19: qty 200

## 2015-08-19 MED ORDER — POLYETHYLENE GLYCOL 3350 17 G PO PACK
17.0000 g | PACK | Freq: Three times a day (TID) | ORAL | Status: DC
  Filled 2015-08-19 (×3): qty 1

## 2015-08-19 MED ORDER — CHLORHEXIDINE GLUCONATE 0.12% ORAL RINSE (MEDLINE KIT)
15.0000 mL | Freq: Two times a day (BID) | OROMUCOSAL | Status: DC
  Administered 2015-08-19 (×2): 15 mL via OROMUCOSAL

## 2015-08-19 MED ORDER — MIDAZOLAM HCL 2 MG/2ML IJ SOLN: 4.0000 mg | Freq: Once | INTRAMUSCULAR | Status: DC

## 2015-08-19 MED ORDER — ETOMIDATE 2 MG/ML IV SOLN
INTRAVENOUS | Status: AC | PRN
  Administered 2015-08-18: 10 mg via INTRAVENOUS

## 2015-08-19 MED ORDER — BISACODYL 10 MG RE SUPP: 10.0000 mg | Freq: Once | RECTAL | Status: DC

## 2015-08-19 MED ORDER — ANTISEPTIC ORAL RINSE SOLUTION (CORINZ)
7.0000 mL | Freq: Four times a day (QID) | OROMUCOSAL | Status: DC
  Administered 2015-08-19 – 2015-08-21 (×7): 7 mL via OROMUCOSAL

## 2015-08-19 MED ORDER — MIDAZOLAM HCL 2 MG/2ML IJ SOLN
1.0000 mg | INTRAMUSCULAR | Status: DC | PRN
  Administered 2015-08-19: 1 mg via INTRAVENOUS
  Filled 2015-08-19: qty 2

## 2015-08-19 MED ORDER — SODIUM CHLORIDE 0.9 % IV SOLN
0.0000 ug/min | INTRAVENOUS | Status: DC
  Administered 2015-08-19 (×2): 30 ug/min via INTRAVENOUS
  Administered 2015-08-19: 40 ug/min via INTRAVENOUS
  Filled 2015-08-19 (×3): qty 1

## 2015-08-19 NOTE — Progress Notes (Signed)
Received patient from ED with BP 71/38 and repeat BP's 59/36 and 68/35. Patient is responsive to pain with signs of decerebrate posturing. HR in the 90's, on ventilator at 40% with oxygen saturations ranging from 85-88%. MD made aware of V/S with orders to follow.

## 2015-08-19 NOTE — ED Notes (Signed)
Patient blood pressure was 76/35. Patients O2 sats was 47 on 2L. Patient was put on 10 Liters [place] and MD was called. IV Fluids was open wide open. Patient O2 Sat went to 95%, MD at bedside with charge nurse, and patient was transferred to a non re-breather. Patient was also transferred to the Recess Rooms with family.

## 2015-08-19 NOTE — ED Notes (Addendum)
Per [facility] hold 1000 PO medications but give protonix IV; [facility] aware of trending VS and related interventions.

## 2015-08-19 NOTE — Progress Notes (Signed)
Pharmacy Antibiotic Note  Jonathan Richards is a 82 y.o. male admitted on 08/27/2015 with sepsis.  Pharmacy has been consulted for Vanc/Zosyn dosing.  PCT/LA elevated  Today, 08/19/2015: Temp: 101.4 yesterday, now afebrile WBC: elevated but improving Renal: AKI resolved; CrCl 49 CG  Plan:  Increase vancomycin to 1000 mg IV q24 hr with improve renal function; Goal trough 15-20 mcg/mL  Continue Zosyn 3.375 g IV given every 8 hrs by 4-hr infusion   Height: 5\' 6"  (167.6 cm) Weight: 101 lb (45.813 kg) IBW/kg (Calculated) : 63.8  Temp (24hrs), Avg:99.5 F (37.5 C), Min:97.9 F (36.6 C), Max:101.4 F (38.6 C)   Recent Labs Lab 06-23-2015 1438 06-23-2015 1454 06-23-2015 1748 06-23-2015 2057 06-23-2015 2309 08/19/15 0442  WBC 23.8*  --   --   --   --  14.5*  CREATININE 1.22  --   --   --   --  0.77  LATICACIDVEN  --  7.13* 7.46* 5.7* 4.9*  --     Estimated Creatinine Clearance: 48.5 mL/min (by C-G formula based on Cr of 0.77).    Allergies  Allergen Reactions  . Asa [Aspirin] Other (See Comments)    Per daughter, pt is not allergic to ASA, but chart has this listed as an allergy.    Antimicrobials this admission: Vanc 3/3 >>  Zosyn 3/3 >>   Dose adjustments this admission:  Microbiology results: 3/3 BCx: ngtd 3/3 UCx: sent   Bernadene Personrew Jordanne Elsbury, PharmD, BCPS Pager: [phone] 08/19/2015, 1:08 PM

## 2015-08-19 NOTE — Consult Note (Signed)
Consultation Note Date: 08/19/2015   Patient Name: Jonathan Richards  DOB: [birth year]  MRN: [id number]  Age / Sex: 82 y.o., male  PCP: Hennie Duos, MD Referring Physician: Raylene Miyamoto, MD  Reason for Consultation: Establishing goals of care, Non pain symptom management, Terminal Care and Withdrawal of life-sustaining treatment    Clinical Assessment/Narrative: Patient is a 82 year old [place] man with a previous medical history of advanced Alzheimer's dementia, peripheral vascular disease hypertension, diabetes who presented to the emergency room from a skilled nursing facility with lethargy, respiratory distress of a 1 day duration. Patient has been living in a skilled nursing facility for one year because of his dementia. At baseline he makes eye contact with visitors, per family appears to recognize them and will occasionally say a word in [place]. EMS was called to this skilled nursing facility, his O2 saturations were 82% on room air he was hypothermic at 37.9C, tachypnea with  respiratory rate of 30, tachycardic into the 120s. He became hypotensive and received, fluid bolus support totaling 4 L since admission. His lactic acid level was initially 7.13 at approximately 1:30 AM this morning patient developed respiratory distress and was intubated and transferred to the ICU. After being admitted to the ICU he continued to be quite labile with his blood pressure and pressure support was initiated. He has only occasionally initiated of breath above the ventilator. They have attempted to wean down on pressor support but his blood pressure rapidly would deteriorate. He responds minimally to painful stimuli. His hands are beginning to mottle. Met with patient's family who consisted of his wife and 2 daughters 2 sons numerous grandchildren as well as great grandchildren nieces and nephews. There is one daughter who is  coming from out of town and expected to arrive in [place] at [facility]. Chart reviewed clinical condition explained to family in depth regarding sepsis. Family recognizes that their loved one is acutely ill, too weak to fight, and they no longer wish to see him suffer. His son reports seen him in a nursing home for the past year has been very hard on the family. He also reports that his father has suffered with chronic pain for the past 10 years. After much discussion family is in agreement that they would like to pursue a terminal wean after his other daughter arrives to in town, and after Buddhist monk  comes to chat prayers for the dead. We also discussed CPR measures as he is still a full code and family in agreement to not perform CPR or defibrillation.  Contacts/Participants in Discussion: Primary Decision Maker: Children and wife; decisions are made collectively as a family   Relationship to Patient n/a HCPOA: yes  Met with multiple children his spouse grandchildren nieces and nephews  SUMMARY OF RECOMMENDATIONS Family waiting for an additional sibling to come into town approximately at 21 PM tonight as well as Buddhist monk to come off her prayers for the dead. At that point anticipate family will want terminal extubation Explained in detail how that is done in terms of starting medication to ensure his comfort and that he will not have any respiratory distress. Spoke to Dr. Elsworth Soho in ICU who is prepared to implement terminal wean medication order set if family does elect to go forward in the middle of the night. Family does not want CPR or defibrillation performed. We'll leave orders in place for pressors as well as ventilator until given notification to proceed and comfort medicines on board  Code Status/Advance Care Planning: Limited code    Code Status Orders        Start     Ordered   08/19/15 1749  Limited resuscitation (code)   Continuous    Question Answer Comment  In the  event of cardiac or respiratory ARREST: Initiate Code Blue, Call Rapid Response No   In the event of cardiac or respiratory ARREST: Perform CPR No   In the event of cardiac or respiratory ARREST: Perform Intubation/Mechanical Ventilation Yes   In the event of cardiac or respiratory ARREST: Use NIPPV/BiPAp only if indicated No   In the event of cardiac or respiratory ARREST: Administer ACLS medications if indicated Yes   In the event of cardiac or respiratory ARREST: Perform Defibrillation or Cardioversion if indicated No      08/19/15 1749    Code Status History    Date Active Date Inactive Code Status Order ID Comments User Context   09/14/2015  8:48 PM 08/19/2015  5:48 PM Full Code [id number]  Vianne Bulls, MD ED   04/19/2015  7:03 PM 04/26/2015  5:17 PM Full Code [id number]  Florencia Reasons, MD Inpatient   06/24/2013  5:13 PM 06/25/2013  8:44 PM Full Code [id number]  Timmothy Euler, MD ED        Symptom Management:   Dyspnea: We'll defer  critical care medicine when family begins terminal wean.   Pain: We'll defer to critical care medicine. No current nonverbal signs and symptoms of pain  Palliative Prophylaxis:   Delirium Protocol, Frequent Pain Assessment and Turn Reposition  Additional Recommendations (Limitations, Scope, Preferences):  No Artificial Feeding, No Blood Transfusions, No Chemotherapy, No Diagnostics, No Hemodialysis, No Radiation, No Surgical Procedures and No Tracheostomy  Psycho-social/Spiritual:  Support System: Strong Desire for further Chaplaincy support:no   Prognosis: Hours - Days  Discharge Planning: Anticipate hospital death   Chief Complaint/ Primary Diagnoses: Present on Admission:  . Acute respiratory failure with hypoxia ([place]) . Protein-calorie malnutrition, severe . PAD (peripheral artery disease) ([place]) . Hypertension . Failure to thrive in adult . DM (diabetes mellitus), type 2 with peripheral vascular complications ([place]) . Dementia with  behavioral disturbance . Severe sepsis ([place]) . Acute kidney injury ([place]) . Septic shock ([place])  I have reviewed the medical record, interviewed the patient and family, and examined the patient. The following aspects are pertinent.  Past Medical History  Diagnosis Date  . Diabetes mellitus without complication ([place])   . Hypertension   . Alzheimer disease   . CAD (coronary artery disease)   . Stroke [facility])    Social History   Social History  . Marital Status: Married    Spouse Name: N/A  . Number of Children: N/A  . Years of Education: N/A   Social History Main Topics  . Smoking status: Never Smoker   . Smokeless tobacco: None  . Alcohol Use: No  . Drug Use: No  . Sexual Activity: Not Currently   Other Topics Concern  . None   Social History Narrative   Family History  Problem Relation Age of Onset  . Heart attack Son    Scheduled Meds: . antiseptic oral rinse  7 mL Mouth Rinse QID  . bisacodyl  10 mg Rectal Once  . chlorhexidine gluconate  15 mL Mouth Rinse BID  . cholecalciferol  800 Units Oral Daily  . feeding supplement (ENSURE ENLIVE)  120 mL Oral TID  . gabapentin  100 mg Oral QHS  . heparin  5,000 Units Subcutaneous 3 times per day  . insulin aspart  0-9 Units Subcutaneous 6 times per day  . magnesium citrate  1 Bottle Oral Once  . memantine  10 mg Oral BID  . metoprolol tartrate  12.5 mg Oral BID  . midazolam  4 mg Intravenous Once  . pantoprazole (PROTONIX) IV  40 mg Intravenous Q24H  . piperacillin-tazobactam (ZOSYN)  IV  3.375 g Intravenous Q8H  . polyethylene glycol  17 g Oral TID  . sodium chloride flush  3 mL Intravenous Q12H  . triamcinolone cream  1 application Topical BID  . vancomycin  1,000 mg Intravenous Q24H   Continuous Infusions: . phenylephrine (NEO-SYNEPHRINE) Adult infusion 20 mcg/min (08/19/15 1843)   PRN Meds:.acetaminophen **OR** acetaminophen, bisacodyl, fentaNYL (SUBLIMAZE) injection, fentaNYL (SUBLIMAZE) injection,  HYDROmorphone (DILAUDID) injection, midazolam, midazolam, ondansetron **OR** ondansetron (ZOFRAN) IV, oxyCODONE, polyethylene glycol, rocuronium Medications Prior to Admission:  Prior to Admission medications   Medication Sig Start Date End Date Taking? Authorizing Provider  cholecalciferol (VITAMIN D) 400 UNITS TABS tablet Take 800 Units by mouth daily.   Yes Historical Provider, MD  colchicine 0.6 MG tablet Take 0.6 mg by mouth daily.   Yes Historical Provider, MD  Dexlansoprazole (DEXILANT) 30 MG capsule Take 1 capsule (30 mg total) by mouth daily. 07/26/15  Yes Gerlene Fee, NP  gabapentin (NEURONTIN) 100 MG capsule Take 1 capsule (100 mg total) by mouth at bedtime. 05/25/15  Yes Gerlene Fee, NP  memantine (NAMENDA) 10 MG tablet Take 10 mg by mouth 2 (two) times daily.    Yes Historical Provider, MD  metoprolol tartrate (LOPRESSOR) 25 MG tablet Take 1 tablet (25 mg total) by mouth 2 (two) times daily. 04/26/15  Yes Orson Eva, MD  NUTRITIONAL SUPPLEMENT LIQD Take 120 mLs by mouth 3 (three) times daily. 07/26/15  Yes Gerlene Fee, NP  oxyCODONE (OXY IR/ROXICODONE) 5 MG immediate release tablet Take 5 mg by mouth every 6 (six) hours as needed for moderate pain or severe pain.    Yes Historical Provider, MD  oxyCODONE (ROXICODONE) 5 MG immediate release tablet Take 1 tablet (5 mg total) by mouth every 6 (six) hours. 05/25/15  Yes Gerlene Fee, NP  triamcinolone cream (KENALOG) 0.1 % Apply 1 application topically every 12 (twelve) hours as needed (for dry skin areas).   Yes Historical Provider, MD  food thickener (THICK IT) POWD Add to thin liquids to make nectar thickened Patient not taking: Reported on 09/15/2015 04/26/15   Orson Eva, MD   Allergies  Allergen Reactions  . Asa [Aspirin] Other (See Comments)    Per daughter, pt is not allergic to ASA, but chart has this listed as an allergy.    Review of Systems  Unable to perform ROS   Physical Exam  Constitutional:  Cachetic,  elderly, ventilated  Cardiovascular:  Irrg, tachy  Respiratory:  ventilated  Neurological:  Unresponsive on ventilator  Skin:  Cool, mottled    Vital Signs: BP 179/93 mmHg  Pulse 125  Temp(Src) 99.7 F (37.6 C) (Axillary)  Resp 26  Ht _0  (1.6 m)  Wt 47.3 kg (104 lb 4.4 oz)  BMI 18.48 kg/m2  SpO2 100%  SpO2: SpO2: 100 % O2 Device:SpO2: 100 % O2 Flow Rate: .O2 Flow Rate (L/min): 2 L/min  IO: Intake/output summary:  Intake/Output Summary (Last 24 hours) at 08/19/15 1853 Last data filed at 08/19/15 1700  Gross per 24 hour  Intake 2677.38 ml  Output   1775 ml  Net 902.38 ml    LBM: Last BM Date: 08/19/15 Baseline Weight: Weight: 45.813 kg (101 lb) Most recent weight: Weight: 47.3 kg (104 lb 4.4 oz)      Palliative Assessment/Data:  Flowsheet Rows        Most Recent Value   Intake Tab    Referral Department  Hospitalist   Unit at Time of Referral  ICU   Palliative Care Primary Diagnosis  Sepsis/Infectious Disease   Date Notified  08/30/2015   Palliative Care Type  New Palliative care   Reason for referral  Clarify Goals of Care, Counsel Regarding Hospice   Date of Admission  09/04/2015   Date first seen by Palliative Care  08/19/15   # of days Palliative referral response time  1 Day(s)   # of days IP prior to Palliative referral  0   Clinical Assessment    Palliative Performance Scale Score  10%   Pain Max last 24 hours  Not able to report   Pain Min Last 24 hours  Not able to report   Dyspnea Max Last 24 Hours  Not able to report   Dyspnea Min Last 24 hours  Not able to report   Nausea Max Last 24 Hours  Not able to report   Nausea Min Last 24 Hours  Not able to report   Anxiety Max Last 24 Hours  Not able to report   Anxiety Min Last 24 Hours  Not able to report   Other Max Last 24 Hours  Not able to report   Psychosocial & Spiritual Assessment    Palliative Care Outcomes    Patient/Family meeting held?  Yes   Who was at the meeting?  wife, 2 sons, 2  daughters, grandchildren and great grandchildren   Patient/Family wishes: Interventions discontinued/not started   Trach, Hemodialysis, Transfusion, PEG   Palliative Care follow-up planned  Yes, Facility      Additional Data Reviewed:  CBC:    Component Value Date/Time   WBC 14.5* 08/19/2015 0442   WBC 11.8 05/24/2015   HGB 12.9* 08/19/2015 0442   HCT 39.3 08/19/2015 0442   PLT 259 08/19/2015 0442   MCV 93.1 08/19/2015 0442   NEUTROABS 10.1* 08/19/2015 0442   LYMPHSABS 2.9 08/19/2015 0442   MONOABS 1.5* 08/19/2015 0442   EOSABS 0.0 08/19/2015 0442   BASOSABS 0.0 08/19/2015 0442   Comprehensive Metabolic Panel:    Component Value Date/Time   NA 140 08/19/2015 0442   NA 141 05/24/2015   K 2.9* 08/19/2015 0442   CL 112* 08/19/2015 0442   CO2 18* 08/19/2015 0442   BUN 15 08/19/2015 0442   BUN 11 05/24/2015   CREATININE 0.77 08/19/2015 0442   CREATININE 0.7 05/24/2015   CREATININE 1.34 11/25/2013 0946   GLUCOSE 142* 08/19/2015 0442   CALCIUM 8.2* 08/19/2015 0442   AST 34 08/17/2015 1438   ALT <5* 09/02/2015 1438   ALKPHOS 84 08/20/2015 1438   BILITOT 0.9 08/17/2015 1438   PROT 7.0 08/31/2015 1438   ALBUMIN 3.5 09/03/2015 1438     Time In: 1630 Time Out: 1800 Time Total: 90 min Greater than 50%  of this time was spent counseling and coordinating care related to the above assessment and plan.Staffed with Dr. Elsworth Soho  Signed by: Dory Horn, NP  Dory Horn, NP  08/19/2015, 6:53 PM  Please contact Palliative Medicine Team phone at [phone] for questions and concerns.

## 2015-08-19 NOTE — H&P (Signed)
PULMONARY / CRITICAL CARE MEDICINE HISTORY AND PHYSICAL EXAMINATION   Name: Jonathan Richards MRN: [id number] DOB: 09/20/1933    ADMISSION DATE:  05-Sep-2015  PRIMARY SERVICE: PCCM  CHIEF COMPLAINT:  Fever  BRIEF PATIENT DESCRIPTION: 82 y/o man with advanced dementia who developed aspiration pneumonitis with hypoxia and severe sepsis.  SIGNIFICANT EVENTS / STUDIES:  Patient intubated in the ED for worsening hypoxia and sepsis.  LINES / TUBES: 7.5 m OETT - 3/4 PIV x3 - 3/4 Foley - 3/4  CULTURES: Blood cx 3/4 >> Urine cx 3/4 >> Lower Resp Cx 3/4 >>  ANTIBIOTICS: Zosyn 3/4 >> Vanc 3/4 >>  HISTORY OF PRESENT ILLNESS:   See full note by Dr. Antionette Char for full details, but in brief, Jonathan Richards is a 82 y/o man with advanced dementia, PAD, HTN, and DM2 who presents with septic shock following an aspiration event.    PAST MEDICAL HISTORY :  Past Medical History  Diagnosis Date  . Diabetes mellitus without complication (HCC)   . Hypertension   . Alzheimer disease   . CAD (coronary artery disease)   . Stroke [facility])    Past Surgical History  Procedure Laterality Date  . Coronary artery bypass graft    . Amputation Right 04/24/2015    Procedure: AMPUTATION ABOVE KNEE;  Surgeon: Sherren Kerns, MD;  Location[facility];  Service: Vascular;  Laterality: Right;   Prior to Admission medications   Medication Sig Start Date End Date Taking? Authorizing Provider  cholecalciferol (VITAMIN D) 400 UNITS TABS tablet Take 800 Units by mouth daily.   Yes Historical Provider, MD  colchicine 0.6 MG tablet Take 0.6 mg by mouth daily.   Yes Historical Provider, MD  Dexlansoprazole (DEXILANT) 30 MG capsule Take 1 capsule (30 mg total) by mouth daily. 07/26/15  Yes Sharee Holster, NP  gabapentin (NEURONTIN) 100 MG capsule Take 1 capsule (100 mg total) by mouth at bedtime. 05/25/15  Yes Sharee Holster, NP  memantine (NAMENDA) 10 MG tablet Take 10 mg by mouth 2 (two) times daily.    Yes Historical Provider, MD   metoprolol tartrate (LOPRESSOR) 25 MG tablet Take 1 tablet (25 mg total) by mouth 2 (two) times daily. 04/26/15  Yes Catarina Hartshorn, MD  NUTRITIONAL SUPPLEMENT LIQD Take 120 mLs by mouth 3 (three) times daily. 07/26/15  Yes Sharee Holster, NP  oxyCODONE (OXY IR/ROXICODONE) 5 MG immediate release tablet Take 5 mg by mouth every 6 (six) hours as needed for moderate pain or severe pain.    Yes Historical Provider, MD  oxyCODONE (ROXICODONE) 5 MG immediate release tablet Take 1 tablet (5 mg total) by mouth every 6 (six) hours. 05/25/15  Yes Sharee Holster, NP  triamcinolone cream (KENALOG) 0.1 % Apply 1 application topically every 12 (twelve) hours as needed (for dry skin areas).   Yes Historical Provider, MD  food thickener (THICK IT) POWD Add to thin liquids to make nectar thickened Patient not taking: Reported on 09/05/2015 04/26/15   Catarina Hartshorn, MD   Allergies  Allergen Reactions  . Asa [Aspirin] Other (See Comments)    Per daughter, pt is not allergic to ASA, but chart has this listed as an allergy.    FAMILY HISTORY:  Family History  Problem Relation Age of Onset  . Heart attack Son    SOCIAL HISTORY:  reports that he has never smoked. He does not have any smokeless tobacco history on file. He reports that he does not drink alcohol or  use illicit drugs.  REVIEW OF SYSTEMS:  Unable to assess due to AMS.  SUBJECTIVE:   VITAL SIGNS: Temp:  [98.6 F (37 C)-101.4 F (38.6 C)] 101.4 F (38.6 C) (03/03 2003) Pulse Rate:  [83-136] 96 (03/04 0445) Resp:  [12-35] 18 (03/04 0445) BP: (65-173)/(50-96) 152/71 mmHg (03/04 0445) SpO2:  [88 %-100 %] 97 % (03/04 0445) FiO2 (%):  [50 %-100 %] 50 % (03/04 0420) Weight:  [101 lb (45.813 kg)] 101 lb (45.813 kg) (03/03 2003) HEMODYNAMICS:   VENTILATOR SETTINGS: Vent Mode:  [-] PRVC FiO2 (%):  [50 %-100 %] 50 % Set Rate:  [18 bmp] 18 bmp Vt Set:  [500 mL] 500 mL PEEP:  [5 cmH20] 5 cmH20 Plateau Pressure:  [19 cmH20] 19 cmH20 INTAKE /  OUTPUT: Intake/Output      03/03 0701 - 03/04 0700   I.V. (mL/kg) 3 (0.1)   IV Piggyback 250   Total Intake(mL/kg) 253 (5.5)   Urine (mL/kg/hr) 1200   Total Output 1200   Net -947         PHYSICAL EXAMINATION: General:  Very frail appearing elderly man in NAD Neuro:  Intubated and sedated HEENT:  Dry MM Neck: No LAD Cardiovascular:  2/VI SEM, abd bruit,  Lungs:  CTAB Abdomen:  Soft, high-pitched bowel sounds Musculoskeletal:  No swollen joints Skin:  Rash  LABS:  CBC  Recent Labs Lab 08/27/2015 1438 08/19/15 0442  WBC 23.8* 14.5*  HGB 14.6 12.9*  HCT 44.3 39.3  PLT 266 259   Coag's  Recent Labs Lab 08/23/2015 2112  APTT 35  INR 1.17   BMET  Recent Labs Lab 09/12/2015 1438 08/19/15 0442  NA 140 140  K 4.0 2.9*  CL 105 112*  CO2 21* 18*  BUN 22* 15  CREATININE 1.22 0.77  GLUCOSE 183* 142*   Electrolytes  Recent Labs Lab 09/09/2015 1438 08/19/15 0442  CALCIUM 9.2 8.2*   Sepsis Markers  Recent Labs Lab 08/16/2015 1748 09/13/2015 2057 08/30/2015 2112 08/26/2015 2309  LATICACIDVEN 7.46* 5.7*  --  4.9*  PROCALCITON  --   --  4.59  --    ABG  Recent Labs Lab 08/19/15 0130  PHART 7.422  PCO2ART 27.4*  PO2ART 67.6*   Liver Enzymes  Recent Labs Lab 08/20/2015 1438  AST 34  ALT <5*  ALKPHOS 84  BILITOT 0.9  ALBUMIN 3.5   Cardiac Enzymes No results for input(s): TROPONINI, PROBNP in the last 168 hours. Glucose  Recent Labs Lab 08/19/15 0311 08/19/15 0605  GLUCAP 106* 110*    Imaging Ct Abdomen Pelvis Wo Contrast  08/16/2015  CLINICAL DATA:  Shortness of breath and increased weakness today, severe sepsis EXAM: CT ABDOMEN AND PELVIS WITHOUT CONTRAST TECHNIQUE: Multidetector CT imaging of the abdomen and pelvis was performed following the standard protocol without IV contrast. COMPARISON:  06/24/2013 chest radiograph FINDINGS: Lower chest: Extensive consolidation bilateral lung bases right worse than left. Findings consistent with bilateral  pneumonia or pneumonitis. Hepatobiliary: Numerous small calcified gallstones. All of these measure less than a cm. No pericholecystic inflammation. Liver is normal. Pancreas: Negative Spleen: Negative Adrenals/Urinary Tract: Adrenal glands are normal. There are no significant abnormalities involving the kidneys. There is a catheter in the bladder which likely accounts for numerous air bubbles within the bladder. There is also calcified material layering dependently within the bladder. The bladder is decompressed. But there is a suggestion of bladder wall thickening. Stomach/Bowel: Nonobstructive bowel gas pattern. Moderate constipation. Rectum is distended to 7 cm by  stool. Vascular/Lymphatic: Heavy atherosclerotic calcification of the aortoiliac vessels. Mild infrarenal abdominal aortic aneurysm to a diameter of 2.6 cm. Reproductive: No acute findings Other: No ascites Musculoskeletal: No acute abnormalities IMPRESSION: 1. Evidence of bilateral lower lobe pneumonia or pneumonitis right worse than left 2. Cholelithiasis 3. Fecal impaction in the rectum Electronically Signed   By: Esperanza Heir M.D.   On: 08/20/2015 22:00   Dg Chest Port 1 View  08/19/2015  CLINICAL DATA:  82 year old male with sepsis status post intubation EXAM: PORTABLE CHEST 1 VIEW COMPARISON:  Set radiograph dated 08/22/2015 FINDINGS: There has been interval placement of an endotracheal tube with tip approximately 4 cm above the carina. An enteric tube is noted extending into the epigastric area with tip over the gastric bubble. Single-view of the chest demonstrate increased interstitial markings predominantly at the right lung base, likely atelectatic changes. Pneumonia is not excluded. There is no focal consolidation, pleural effusion, or pneumothorax. Median sternotomy wires and CABG vascular clips noted. Cardiac silhouette is within normal limits. No acute osseous pathology. IMPRESSION: Endotracheal tube above the carina. Electronically  Signed   By: Elgie Collard M.D.   On: 08/19/2015 00:53   Dg Chest Port 1 View  09/03/2015  CLINICAL DATA:  Pt with SOB and increased weakness starting today. Facility reports that the patient was normal yesterday and lethargic today. H/o diabetes, HTN, CAD, Stroke, and alzheimers disease. Surgical h/o CABG.Nonsmoker. EXAM: PORTABLE CHEST 1 VIEW COMPARISON:  04/19/2015 FINDINGS: Changes from CABG surgery are stable. Cardiac silhouette is normal in size and configuration. No mediastinal or hilar masses or convincing adenopathy. Clear lungs.  No pleural effusion or pneumothorax. Bony thorax is diffusely demineralized but grossly intact. IMPRESSION: No acute cardiopulmonary disease. Electronically Signed   By: Amie Portland M.D.   On: 08/20/2015 15:58    EKG: Sinus Tach CXR: CT imaging shows bibasilar opacities  ASSESSMENT / PLAN:  Principal Problem:   Acute respiratory failure with hypoxia (HCC) Active Problems:   Hypertension   Dementia with behavioral disturbance   DM (diabetes mellitus), type 2 with peripheral vascular complications (HCC)   Protein-calorie malnutrition, severe   Failure to thrive in adult   PAD (peripheral artery disease) (HCC)   Severe sepsis (HCC)   Acute kidney injury (HCC)   Septic shock (HCC)   PULMONARY A: - Aspiration pneumonitis causing septic shock - Hypoxic respiartory failure - Need for mechanical ventilation P:   Tx with Vanc / Zosyn - PRVC, daily SBT -VAP bundle  CARDIOVASCULAR A: - CAD, s/p CABG - PAD P:   - No acute issues  RENAL A: - AKI - Hypokalemia P:   Replete K  GASTROINTESTINAL A: - Severe constipation - Severe arthrosclerotic dz of the mesentery P:   - Aggressive OBR - Need to considerer secondary diagnoses such as mesenteric ischemia  HEMATOLOGIC A: - Severe leukocytosis P:   Likely due to infection. Will Trend.  INFECTIOUS A: - Severe aspiration pneumonitis P:   Treat with supportive  care.  ENDOCRINE A: Diabetes P:    NEUROLOGIC A: Need for sedation P:   Propofol appears to be good choice for now given HTN  BEST PRACTICE / DISPOSITION Level of Care:  ICM Primary Service:  PCCM Consultants:  None Code Status:  Full Diet:  NPO DVT Px:  Heparin GI Px:  None Skin Integrity:  None Social / Family:  None  TODAY'S SUMMARY: 82 y/o man with advanced dementia presenting with septic shock, without symptoms.  I have  personally obtained a history, examined the patient, evaluated laboratory and imaging results, formulated the assessment and plan and placed orders.  CRITICAL CARE: The patient is critically ill with multiple organ systems failure and requires high complexity decision making for assessment and support, frequent evaluation and titration of therapies, application of advanced monitoring technologies and extensive interpretation of multiple databases. Critical Care Time devoted to patient care services described in this note is 80 minutes.   Jamie KatoAaron Jay Haskew, MD Pulmonary and Critical Care Medicine [facility] Pager: [phone]   08/19/2015, 6:17 AM

## 2015-08-19 NOTE — Progress Notes (Signed)
eLink Physician-Brief Progress Note Patient Name: Jonathan Richards DOB: 04/28/1934 MRN: [id number]   Date of Service  08/19/2015  HPI/Events of Note  aspn pna, septic shock on neo gtt, hypoxic  Underlying dmentia  eICU Interventions  Drop FIO2 as able Advance NG Family meeting in am for goals of care     Intervention Category Evaluation Type: New Patient Evaluation  [name] V. 08/19/2015, 3:20 PM

## 2015-08-19 NOTE — Progress Notes (Signed)
Post KUB results, OG tube advanced 1 inch before administration of medications.

## 2015-08-20 DIAGNOSIS — F0391 Unspecified dementia with behavioral disturbance: Secondary | ICD-10-CM

## 2015-08-20 DIAGNOSIS — N179 Acute kidney failure, unspecified: Secondary | ICD-10-CM

## 2015-08-20 DIAGNOSIS — J9601 Acute respiratory failure with hypoxia: Secondary | ICD-10-CM

## 2015-08-20 DIAGNOSIS — J690 Pneumonitis due to inhalation of food and vomit: Secondary | ICD-10-CM

## 2015-08-20 LAB — URINE CULTURE: CULTURE: NO GROWTH

## 2015-08-20 MED ORDER — GLYCOPYRROLATE 0.2 MG/ML IJ SOLN
0.2000 mg | INTRAMUSCULAR | Status: DC | PRN
  Administered 2015-08-20: 0.2 mg via INTRAVENOUS
  Filled 2015-08-20 (×2): qty 1

## 2015-08-20 MED ORDER — MORPHINE BOLUS VIA INFUSION
5.0000 mg | INTRAVENOUS | Status: DC | PRN
  Filled 2015-08-20: qty 20

## 2015-08-20 MED ORDER — GLYCOPYRROLATE 0.2 MG/ML IJ SOLN
0.4000 mg | Freq: Three times a day (TID) | INTRAMUSCULAR | Status: DC
  Administered 2015-08-20 – 2015-08-21 (×4): 0.4 mg via INTRAVENOUS
  Filled 2015-08-20 (×9): qty 2

## 2015-08-20 MED ORDER — MORPHINE SULFATE 25 MG/ML IV SOLN
10.0000 mg/h | INTRAVENOUS | Status: DC
  Administered 2015-08-20 – 2015-08-21 (×2): 10 mg/h via INTRAVENOUS
  Filled 2015-08-20 (×2): qty 10

## 2015-08-20 NOTE — Progress Notes (Signed)
Nutrition Brief Note  Pt identified as at nutrition risk on the Malnutrition Screen Tool  Chart reviewed. Pt now transitioning to comfort care.  No nutrition interventions warranted at this time.  Please consult as needed.   Rod Majerus, MS, RD, LDN Pager: [phone] After Hours Pager: [phone]    

## 2015-08-20 NOTE — H&P (Signed)
PULMONARY / CRITICAL CARE MEDICINE HISTORY AND PHYSICAL EXAMINATION   Name: Jonathan Richards MRN: [id number] DOB: 1933/06/25    ADMISSION DATE:  2015/08/31  PRIMARY SERVICE: PCCM  CHIEF COMPLAINT:  Fever  BRIEF PATIENT DESCRIPTION: 82 y/o man with advanced dementia who developed aspiration pneumonitis with hypoxia and severe sepsis.  SIGNIFICANT EVENTS / STUDIES:  Patient intubated in the ED for worsening hypoxia and sepsis. Comfort care started  LINES / TUBES: 7.5 m OETT - 3/4>>>3/4 PIV x3 - 3/4 Foley - 3/4  CULTURES: Blood cx 3/4 >> Urine cx 3/4 >> Lower Resp Cx 3/4 >>  ANTIBIOTICS: Zosyn 3/4 >>off Vanc 3/4 >>off   SUBJECTIVE: 10 morphine drip  VITAL SIGNS: Temp:  [97.9 F (36.6 C)-100.3 F (37.9 C)] 98.7 F (37.1 C) (03/04 2035) Pulse Rate:  [58-125] 110 (03/05 0100) Resp:  [14-26] 25 (03/05 0200) BP: (48-187)/(28-93) 187/66 mmHg (03/05 0100) SpO2:  [88 %-100 %] 90 % (03/05 0200) FiO2 (%):  [40 %-100 %] 40 % (03/04 2326) Weight:  [47.3 kg (104 lb 4.4 oz)] 47.3 kg (104 lb 4.4 oz) (03/04 1308) HEMODYNAMICS:   VENTILATOR SETTINGS: Vent Mode:  [-] PRVC FiO2 (%):  [40 %-100 %] 40 % Set Rate:  [18 bmp] 18 bmp Vt Set:  [500 mL] 500 mL PEEP:  [5 cmH20] 5 cmH20 Plateau Pressure:  [16 cmH20-23 cmH20] 17 cmH20 INTAKE / OUTPUT: Intake/Output      03/04 0701 - 03/05 0700 03/05 0701 - 03/06 0700   I.V. (mL/kg) 611.6 (12.9)    IV Piggyback 2300    Total Intake(mL/kg) 2911.6 (61.6)    Urine (mL/kg/hr) 950 (0.8)    Emesis/NG output 50 (0)    Stool 0 (0)    Total Output 1000     Net +1911.6          Stool Occurrence 1 x      PHYSICAL EXAMINATION: General:  comfortable Neuro:  Unresponsive, calm, appears at peace HEENT:  Dry MM Neck: No LAD Cardiovascular:  2/VI SEM, abd bruit, not brady  Lungs:  Ronchi diffuse Abdomen:  Soft, scaffoid, nt, nd Musculoskeletal:  No swollen joints Skin:  Rash  LABS:  CBC  Recent Labs Lab 31-Aug-2015 1438 08/19/15 0442  WBC  23.8* 14.5*  HGB 14.6 12.9*  HCT 44.3 39.3  PLT 266 259   Coag's  Recent Labs Lab 2015/08/31 2112  APTT 35  INR 1.17   BMET  Recent Labs Lab Aug 31, 2015 1438 08/19/15 0442 08/19/15 1824  NA 140 140  --   K 4.0 2.9* 3.7  CL 105 112*  --   CO2 21* 18*  --   BUN 22* 15  --   CREATININE 1.22 0.77  --   GLUCOSE 183* 142*  --    Electrolytes  Recent Labs Lab 08-31-15 1438 08/19/15 0442  CALCIUM 9.2 8.2*   Sepsis Markers  Recent Labs Lab Aug 31, 2015 1748 August 31, 2015 2057 Aug 31, 2015 2112 08-31-15 2309  LATICACIDVEN 7.46* 5.7*  --  4.9*  PROCALCITON  --   --  4.59  --    ABG  Recent Labs Lab 08/19/15 0130 08/19/15 1323  PHART 7.422 7.417  PCO2ART 27.4* 25.5*  PO2ART 67.6* 129*   Liver Enzymes  Recent Labs Lab 2015/08/31 1438  AST 34  ALT <5*  ALKPHOS 84  BILITOT 0.9  ALBUMIN 3.5   Cardiac Enzymes No results for input(s): TROPONINI, PROBNP in the last 168 hours. Glucose  Recent Labs Lab 08/19/15 0311 08/19/15 0605 08/19/15 0806 08/19/15  1342 08/19/15 1610 08/19/15 2014  GLUCAP 106* 110* 112* 96 97 130*    Imaging Ct Abdomen Pelvis Wo Contrast  08/26/2015  CLINICAL DATA:  Shortness of breath and increased weakness today, severe sepsis EXAM: CT ABDOMEN AND PELVIS WITHOUT CONTRAST TECHNIQUE: Multidetector CT imaging of the abdomen and pelvis was performed following the standard protocol without IV contrast. COMPARISON:  06/24/2013 chest radiograph FINDINGS: Lower chest: Extensive consolidation bilateral lung bases right worse than left. Findings consistent with bilateral pneumonia or pneumonitis. Hepatobiliary: Numerous small calcified gallstones. All of these measure less than a cm. No pericholecystic inflammation. Liver is normal. Pancreas: Negative Spleen: Negative Adrenals/Urinary Tract: Adrenal glands are normal. There are no significant abnormalities involving the kidneys. There is a catheter in the bladder which likely accounts for numerous air bubbles  within the bladder. There is also calcified material layering dependently within the bladder. The bladder is decompressed. But there is a suggestion of bladder wall thickening. Stomach/Bowel: Nonobstructive bowel gas pattern. Moderate constipation. Rectum is distended to 7 cm by stool. Vascular/Lymphatic: Heavy atherosclerotic calcification of the aortoiliac vessels. Mild infrarenal abdominal aortic aneurysm to a diameter of 2.6 cm. Reproductive: No acute findings Other: No ascites Musculoskeletal: No acute abnormalities IMPRESSION: 1. Evidence of bilateral lower lobe pneumonia or pneumonitis right worse than left 2. Cholelithiasis 3. Fecal impaction in the rectum Electronically Signed   By: Esperanza Heiraymond  Rubner M.D.   On: 09/15/2015 22:00   Dg Chest 1 View  08/19/2015  CLINICAL DATA:  Hypoxia, orogastric tube placement EXAM: CHEST 1 VIEW COMPARISON:  08/19/2015 FINDINGS: Right-sided skin folds simulate pneumothorax. Cardiac silhouette normal. Status post CABG. Mild bibasilar opacity stable. Stable endotracheal tube. OG tube again identified with tip just beyond the gastroesophageal junction by about 2.5 cm. Side hole of the orogastric tube is at the GE junction once again. IMPRESSION: No significant change. Orogastric tube side hole is at the GE junction with tip about 2.5 cm beyond the gastroesophageal junction. The tube should be advanced with repeat imaging to confirm. Electronically Signed   By: Esperanza Heiraymond  Rubner M.D.   On: 08/19/2015 14:16   Dg Abd 1 View  08/19/2015  CLINICAL DATA:  OG tube placement. EXAM: ABDOMEN - 1 VIEW COMPARISON:  None. FINDINGS: The side port of the OG tube is near the GE junction. Recommend advancement. Cholelithiasis is noted. Air-filled loops of mildly prominent small bowel are seen in the left upper quadrant. No other acute abnormalities or changes. Fecal loading in the region of the rectum again noted. IMPRESSION: The side port of the OG tube is at the GE junction. Recommend  advancement. Mildly prominent loops of small bowel in the left upper quadrant. Recommend attention on follow-up. Fecal loading in the rectum. Electronically Signed   By: Gerome Samavid  Williams III M.D   On: 08/19/2015 14:21   Dg Chest Port 1 View  08/19/2015  CLINICAL DATA:  82 year old male with sepsis status post intubation EXAM: PORTABLE CHEST 1 VIEW COMPARISON:  Set radiograph dated 08/20/2015 FINDINGS: There has been interval placement of an endotracheal tube with tip approximately 4 cm above the carina. An enteric tube is noted extending into the epigastric area with tip over the gastric bubble. Single-view of the chest demonstrate increased interstitial markings predominantly at the right lung base, likely atelectatic changes. Pneumonia is not excluded. There is no focal consolidation, pleural effusion, or pneumothorax. Median sternotomy wires and CABG vascular clips noted. Cardiac silhouette is within normal limits. No acute osseous pathology. IMPRESSION: Endotracheal tube above  the carina. Electronically Signed   By: Elgie Collard M.D.   On: 08/19/2015 00:53   Dg Chest Port 1 View  Sep 16, 2015  CLINICAL DATA:  Pt with SOB and increased weakness starting today. Facility reports that the patient was normal yesterday and lethargic today. H/o diabetes, HTN, CAD, Stroke, and alzheimers disease. Surgical h/o CABG.Nonsmoker. EXAM: PORTABLE CHEST 1 VIEW COMPARISON:  04/19/2015 FINDINGS: Changes from CABG surgery are stable. Cardiac silhouette is normal in size and configuration. No mediastinal or hilar masses or convincing adenopathy. Clear lungs.  No pleural effusion or pneumothorax. Bony thorax is diffusely demineralized but grossly intact. IMPRESSION: No acute cardiopulmonary disease. Electronically Signed   By: Amie Portland M.D.   On: 2015-09-16 15:58    EKG: Sinus Tach CXR: CT imaging shows bibasilar opacities  ASSESSMENT / PLAN:  Principal Problem:   Acute respiratory failure with hypoxia  (HCC) Active Problems:   Hypertension   Dementia with behavioral disturbance   DM (diabetes mellitus), type 2 with peripheral vascular complications (HCC)   Protein-calorie malnutrition, severe   Failure to thrive in adult   PAD (peripheral artery disease) (HCC)   Severe sepsis (HCC)   Acute kidney injury (HCC)   Septic shock (HCC)   Pressure ulcer   Palliative care encounter   PULMONARY A: - Aspiration pneumonitis causing septic shock - Hypoxic respiartory failure - Need for mechanical ventilation Comfort care P:   NO ESCALLATION O2!!!, will prolong suffering See neuro titrate to rr 12-18  NEUROLOGIC A: COmfort care P:   Morphine in place, I dont see titration goals Add titration to rr 12-18 or pain / distress  Io updated son at bedside Pt is peaceful Appreciate pall care help D/w RN To floor  Mcarthur Rossetti. Tyson Alias, MD, FACP Pgr: [phone] [facility]

## 2015-08-20 NOTE — Progress Notes (Signed)
Chaplain visited per Spiritual Care consult. Ms Jonathan Richards was not responsive and there were no family present. A picture of a Buddha was placed by the family in his view to assist in his peaceful transition at the EOL.  Page if Jonathan Richards dies or the family needs spiritual or emotional support  Benjie KarvonenCharles D. Danahi Reddish, DMin Chaplain

## 2015-08-20 NOTE — Progress Notes (Signed)
eLink Physician-Brief Progress Note Patient Name: Jonathan Richards DOB: 08/02/1933 MRN: [id number]   Date of Service  08/20/2015  HPI/Events of Note  Patient is DNR with no escalation of interventions.  Family now ready to have the patient extubated and is full comfort care.  eICU Interventions  Orders for full comfort care placed. Patient to be extubated to room air     Intervention Category Major Interventions: End of life / care limitation discussion  Sophi Calligan 08/20/2015, 1:11 AM

## 2015-08-20 NOTE — Procedures (Signed)
Extubation Procedure Note  Patient Details:   Name: Jonathan Richards DOB: 06/02/1934 MRN: [id number]   Pt terminally extubated to RA per MD order. Family and RN at bedside.     Fredrich BirksMarshburn, Haitham Dolinsky Lynne 08/20/2015, 2:01 AM

## 2015-08-20 NOTE — Progress Notes (Signed)
Chaplain returned and found family members keeping watch. Chaplain informed that [employer] came on 4 March to pray over Mr Jonathan Richards and left the beautiful picture of Buddha for him to focus on when he awakened. The family is at peace with what is happening to Mr Jonathan Richards and are watching with him until his death.  Benjie Karvonenharles D. Joene Gelder, DMin Chaplain

## 2015-08-20 NOTE — Progress Notes (Signed)
Daily Progress Note   Patient Name: Jonathan Richards       Date: 08/20/2015 DOB: 05/03/1934  Age: 82 y.o. MRN#: [id number] Attending Physician: Nelda Bucks, MD Primary Care Physician: Margit Hanks, MD Admit Date: 08-28-2015  Reason for Consultation/Follow-up: Non pain symptom management, Terminal Care and Withdrawal of life-sustaining treatment  Subjective: Patient was terminally extubated last evening without incident. He is on a morphine infusion. Breathing is even and regular, unlabored,. No nonverbal signs and symptoms of pain. He is now a full DO NOT RESUSCITATE and is off pressors Interval Events: Terminally extubated now full comfort care Length of Stay: 2 days  Current Medications: Scheduled Meds:  . antiseptic oral rinse  7 mL Mouth Rinse QID  . glycopyrrolate  0.4 mg Intravenous 3 times per day  . midazolam  4 mg Intravenous Once    Continuous Infusions: . morphine 10 mg/hr (08/20/15 1100)    PRN Meds: glycopyrrolate, midazolam, midazolam, morphine  Physical Exam: Physical Exam  Constitutional:  Cachetic frail acutely ill man  Cardiovascular:  Irrg, tachy  Pulmonary/Chest:  irgg pattern  Neurological:  unresponsive  Skin:  Cool, faint mottling                Vital Signs: BP 187/66 mmHg  Pulse 110  Temp(Src) 98.7 F (37.1 C) (Axillary)  Resp 8  Ht  (1.6 m)  Wt 47.3 kg (104 lb 4.4 oz)  BMI 18.48 kg/m2  SpO2 92% SpO2: SpO2: 92 % O2 Device: O2 Device: Nasal Cannula O2 Flow Rate: O2 Flow Rate (L/min): 3 L/min  Intake/output summary:  Intake/Output Summary (Last 24 hours) at 08/20/15 1156 Last data filed at 08/20/15 1100  Gross per 24 hour  Intake 3001.55 ml  Output    500 ml  Net 2501.55 ml   LBM: Last BM Date: 08/19/15 Baseline Weight:  Weight: 45.813 kg (101 lb) Most recent weight: Weight: 47.3 kg (104 lb 4.4 oz)       Palliative Assessment/Data: Flowsheet Rows        Most Recent Value   Intake Tab    Referral Department  Hospitalist   Unit at Time of Referral  ICU   Palliative Care Primary Diagnosis  Sepsis/Infectious Disease   Date Notified  08-28-15   Palliative Care Type  New Palliative  care   Reason for referral  Clarify Goals of Care, Counsel Regarding Hospice   Date of Admission  09/14/2015   Date first seen by Palliative Care  08/19/15   # of days Palliative referral response time  1 Day(s)   # of days IP prior to Palliative referral  0   Clinical Assessment    Palliative Performance Scale Score  10%   Pain Max last 24 hours  Not able to report   Pain Min Last 24 hours  Not able to report   Dyspnea Max Last 24 Hours  Not able to report   Dyspnea Min Last 24 hours  Not able to report   Nausea Max Last 24 Hours  Not able to report   Nausea Min Last 24 Hours  Not able to report   Anxiety Max Last 24 Hours  Not able to report   Anxiety Min Last 24 Hours  Not able to report   Other Max Last 24 Hours  Not able to report   Psychosocial & Spiritual Assessment    Palliative Care Outcomes    Patient/Family meeting held?  Yes   Who was at the meeting?  wife, 2 sons, 2 daughters, grandchildren and great grandchildren   Patient/Family wishes: Interventions discontinued/not started   Trach, Hemodialysis, Transfusion, PEG   Palliative Care follow-up planned  Yes, Facility      Additional Data Reviewed: CBC    Component Value Date/Time   WBC 14.5* 08/19/2015 0442   WBC 11.8 05/24/2015   RBC 4.22 08/19/2015 0442   HGB 12.9* 08/19/2015 0442   HCT 39.3 08/19/2015 0442   PLT 259 08/19/2015 0442   MCV 93.1 08/19/2015 0442   MCH 30.6 08/19/2015 0442   MCHC 32.8 08/19/2015 0442   RDW 14.4 08/19/2015 0442   LYMPHSABS 2.9 08/19/2015 0442   MONOABS 1.5* 08/19/2015 0442   EOSABS 0.0 08/19/2015 0442   BASOSABS 0.0  08/19/2015 0442    CMP     Component Value Date/Time   NA 140 08/19/2015 0442   NA 141 05/24/2015   K 3.7 08/19/2015 1824   CL 112* 08/19/2015 0442   CO2 18* 08/19/2015 0442   GLUCOSE 142* 08/19/2015 0442   BUN 15 08/19/2015 0442   BUN 11 05/24/2015   CREATININE 0.77 08/19/2015 0442   CREATININE 0.7 05/24/2015   CREATININE 1.34 11/25/2013 0946   CALCIUM 8.2* 08/19/2015 0442   PROT 7.0 09/02/2015 1438   ALBUMIN 3.5 09/03/2015 1438   AST 34 08/17/2015 1438   ALT <5* 08/16/2015 1438   ALKPHOS 84 09/15/2015 1438   BILITOT 0.9 08/25/2015 1438   GFRNONAA >60 08/19/2015 0442   GFRAA >60 08/19/2015 0442       Problem List:  Patient Active Problem List   Diagnosis Date Noted  . Septic shock (HCC) 08/19/2015  . Pressure ulcer 08/19/2015  . Palliative care encounter   . Acute respiratory failure with hypoxia (HCC) 08/29/2015  . Severe sepsis (HCC) 09/12/2015  . Acute kidney injury (HCC) 08/29/2015  . Aspiration pneumonia (HCC)   . GERD without esophagitis 05/25/2015  . Failure to thrive in adult 05/25/2015  . PAD (peripheral artery disease) (HCC) 05/25/2015  . Dysphagia 04/27/2015  . Hyperlipidemia 04/27/2015  . Postoperative anemia due to acute blood loss 04/27/2015  . Hypernatremia   . Wound infection (HCC)   . Protein-calorie malnutrition, severe 04/20/2015  . Acute intracranial hemorrhage (HCC) 06/24/2013  . Hypertension 10/22/2012  . Dementia with behavioral disturbance 10/22/2012  .  DM (diabetes mellitus), type 2 with peripheral vascular complications (HCC) 10/22/2012  . Psoriasis of scalp 10/22/2012     Palliative Care Assessment & Plan    1.Code Status:  DNR    Code Status Orders        Start     Ordered   08/20/15 0110  DNR (Do not attempt resuscitation)   Continuous    Question Answer Comment  In the event of cardiac or respiratory ARREST Do not call a "code blue"   In the event of cardiac or respiratory ARREST Do not perform Intubation, CPR,  defibrillation or ACLS   In the event of cardiac or respiratory ARREST Use medication by any route, position, wound care, and other measures to relive pain and suffering. May use oxygen, suction and manual treatment of airway obstruction as needed for comfort.      08/20/15 0110    Code Status History    Date Active Date Inactive Code Status Order ID Comments User Context   08/19/2015  5:49 PM 08/20/2015  1:10 AM Partial Code [id number]  Irean Hong, NP Inpatient   08/25/2015  8:48 PM 08/19/2015  5:48 PM Full Code [id number]  Briscoe Deutscher, MD ED   04/19/2015  7:03 PM 04/26/2015  5:17 PM Full Code [id number]  Albertine Grates, MD Inpatient   06/24/2013  5:13 PM 06/25/2013  8:44 PM Full Code [id number]  Elenora Gamma, MD ED       2. Goals of Care/Additional Recommendations:  Family history. For end-of-life. He is full comfort care  Limitations on Scope of Treatment: Full comfort care  Desire for further Chaplaincy support:no  Psycho-social Needs:none  3. Symptom Management:      1. Dyspnea: Continue morphine drip as ordered by critical care medicine monitor and titrate for effect      2. Secretions: Start Robinul 0.4 mg IV every 8 hours around-the-clock and 0.2 mg every 4 when necessary  4. Palliative Prophylaxis:   Aspiration, Bowel Regimen, Frequent Pain Assessment, Oral Care and Turn Reposition  5. Prognosis: Hours - Days  6. Discharge Planning:  Anticipate hospital death. If patient does stabilize would meet criteria for inpatient hospice     Thank you for allowing the Palliative Medicine Team to assist in the care of this patient.   Time In: 1130 Time Out: 1200 Total Time 30 min  Prolonged Time Billed  no         Irean Hong, NP  08/20/2015, 11:56 AM  Please contact Palliative Medicine Team phone at [phone] for questions and concerns.

## 2015-08-21 LAB — HEMOGLOBIN A1C
Hgb A1c MFr Bld: 5.7 % — ABNORMAL HIGH (ref 4.8–5.6)
MEAN PLASMA GLUCOSE: 117 mg/dL

## 2015-08-22 NOTE — Progress Notes (Signed)
Pt placed in body bag and identification was made. Death certificate was given to funeral home and identification was placed on pt. [facility] 0730 by [facility]. Family took the remainder of pt belongings home with them. There is no questions or concerns from the pt at this time.  Deardra Hinkley W Moranda Billiot, RN

## 2015-08-23 LAB — CULTURE, BLOOD (ROUTINE X 2)
CULTURE: NO GROWTH
CULTURE: NO GROWTH

## 2015-09-10 NOTE — Progress Notes (Signed)
This encounter was created in error - please disregard.

## 2015-09-16 NOTE — Discharge Summary (Signed)
PULMONARY / CRITICAL CARE MEDICINE DEATH NOTE   Name: Jonathan Richards MRN: [id number] DOB: 05/19/1934    ADMISSION DATE:  09/03/2015  Date of death Jan 12, 2016.  PRIMARY SERVICE: PCCM  CAUSE OF DEATH:  Aspiration pneumonia  BRIEF PATIENT HPI AND HOSPITAL COURSE: 82 y/o man with advanced dementia who developed aspiration pneumonitis with hypoxia and severe sepsis.  He was admitted on 08/27/2015 to the internal medicine service and then transferred to the intensive care unit. Not long after admission we discussed goals of care with family. He was treated with antibiotics initially but after discussion with family they stated that they only wanted to focus on his comfort considering his advanced dementia and severe aspiration pneumonia with hypoxemic respiratory failure. His care is transitioned to comfort measures only and he was treated with a morphine drip. He died peacefully on Jan 12, 2016.  Heber CarolinaBrent Estellar Cadena, MD [facility] Pager: [phone] Cell: [phone] After 3pm or if no response, call [phone]

## 2015-09-16 NOTE — Progress Notes (Signed)
Pts family informed nursing staff that pt passed at [id number] but would not allow anyone in room for verification.  At 2320 RN was allowed in room for verification. Therefore Time of Death noted at 2320.  Due to religious reasons the body can not be touched for 8 more hours.  AC Notified and came up and spoke to family.  Family and several Monks present performing religious ritual.  Pt will remain in room untouched until the time pts family allows us to perform morgue care.  [facility] was notified, pt is not a candidate for donation. Barnett HatterKallam, Jairy Angulo P

## 2015-09-16 NOTE — Progress Notes (Signed)
PULMONARY / CRITICAL CARE MEDICINE HISTORY AND PHYSICAL EXAMINATION   Name: Jonathan Richards MRN: [id number] DOB: 1934-05-01    ADMISSION DATE:  08/25/2015  PRIMARY SERVICE: PCCM  CHIEF COMPLAINT:  Fever  BRIEF PATIENT DESCRIPTION: 82 y/o man with advanced dementia who developed aspiration pneumonitis with hypoxia and severe sepsis.  Withdrawal of care on 3/4, on morphine drip since then.   SIGNIFICANT EVENTS / STUDIES:  Patient intubated in the ED for worsening hypoxia and sepsis. Comfort care started 3/5 after extubation on 3/4  LINES / TUBES: 7.5 m OETT - 3/4>>>3/4 PIV x3 - 3/4 Foley - 3/4  CULTURES: Blood cx 3/4 >> Urine cx 3/4 >> Lower Resp Cx 3/4 >>  ANTIBIOTICS: Zosyn 3/4 >>off Vanc 3/4 >>off   SUBJECTIVE: resting comfortably on morphine drip, no acute events  VITAL SIGNS: Temp:  [100 F (37.8 C)] 100 F (37.8 C) (03/06 0531) Pulse Rate:  [134] 134 (03/06 0531) Resp:  [8-23] 14 (03/06 0531) BP: (90-100)/(48-58) 94/52 mmHg (03/06 0531) SpO2:  [87 %-94 %] 91 % (03/06 0531) Weight:  [51.3 kg (113 lb 1.5 oz)] 51.3 kg (113 lb 1.5 oz) (03/05 1748) HEMODYNAMICS:   VENTILATOR SETTINGS:   INTAKE / OUTPUT: Intake/Output      03/05 0701 - 03/06 0700 03/06 0701 - 03/07 0700   P.O. 0    I.V. (mL/kg) 140 (2.7)    IV Piggyback     Total Intake(mL/kg) 140 (2.7)    Urine (mL/kg/hr) 600 (0.5)    Emesis/NG output     Stool     Total Output 600     Net -460            PHYSICAL EXAMINATION: General:  No distress HENT: NCAT, OP clear Pulm: CTA B CV : RRR, no mgr Belly: soft, no bowel sounds Neuro: sedated on morphine drip  LABS:  CBC  Recent Labs Lab Aug 25, 2015 1438 08/19/15 0442  WBC 23.8* 14.5*  HGB 14.6 12.9*  HCT 44.3 39.3  PLT 266 259   Coag's  Recent Labs Lab 2015-08-25 2112  APTT 35  INR 1.17   BMET  Recent Labs Lab 25-Aug-2015 1438 08/19/15 0442 08/19/15 1824  NA 140 140  --   K 4.0 2.9* 3.7  CL 105 112*  --   CO2 21* 18*  --   BUN 22*  15  --   CREATININE 1.22 0.77  --   GLUCOSE 183* 142*  --    Electrolytes  Recent Labs Lab 2015-08-25 1438 08/19/15 0442  CALCIUM 9.2 8.2*   Sepsis Markers  Recent Labs Lab 08-25-15 1748 25-Aug-2015 2057 August 25, 2015 2112 08/25/15 2309  LATICACIDVEN 7.46* 5.7*  --  4.9*  PROCALCITON  --   --  4.59  --    ABG  Recent Labs Lab 08/19/15 0130 08/19/15 1323  PHART 7.422 7.417  PCO2ART 27.4* 25.5*  PO2ART 67.6* 129*   Liver Enzymes  Recent Labs Lab 2015/08/25 1438  AST 34  ALT <5*  ALKPHOS 84  BILITOT 0.9  ALBUMIN 3.5   Cardiac Enzymes No results for input(s): TROPONINI, PROBNP in the last 168 hours. Glucose  Recent Labs Lab 08/19/15 0311 08/19/15 0605 08/19/15 0806 08/19/15 1342 08/19/15 1610 08/19/15 2014  GLUCAP 106* 110* 112* 96 97 130*    Imaging Dg Chest 1 View  08/19/2015  CLINICAL DATA:  Hypoxia, orogastric tube placement EXAM: CHEST 1 VIEW COMPARISON:  08/19/2015 FINDINGS: Right-sided skin folds simulate pneumothorax. Cardiac silhouette normal. Status post CABG. Mild bibasilar  opacity stable. Stable endotracheal tube. OG tube again identified with tip just beyond the gastroesophageal junction by about 2.5 cm. Side hole of the orogastric tube is at the GE junction once again. IMPRESSION: No significant change. Orogastric tube side hole is at the GE junction with tip about 2.5 cm beyond the gastroesophageal junction. The tube should be advanced with repeat imaging to confirm. Electronically Signed   By: Esperanza Heiraymond  Rubner M.D.   On: 08/19/2015 14:16   Dg Abd 1 View  08/19/2015  CLINICAL DATA:  OG tube placement. EXAM: ABDOMEN - 1 VIEW COMPARISON:  None. FINDINGS: The side port of the OG tube is near the GE junction. Recommend advancement. Cholelithiasis is noted. Air-filled loops of mildly prominent small bowel are seen in the left upper quadrant. No other acute abnormalities or changes. Fecal loading in the region of the rectum again noted. IMPRESSION: The side port  of the OG tube is at the GE junction. Recommend advancement. Mildly prominent loops of small bowel in the left upper quadrant. Recommend attention on follow-up. Fecal loading in the rectum. Electronically Signed   By: Gerome Samavid  Williams III M.D   On: 08/19/2015 14:21    EKG: Sinus Tach CXR: CT imaging shows bibasilar opacities  ASSESSMENT / PLAN:  Principal Problem:   Acute respiratory failure with hypoxia (HCC) Active Problems:   Hypertension   Dementia with behavioral disturbance   DM (diabetes mellitus), type 2 with peripheral vascular complications (HCC)   Protein-calorie malnutrition, severe   Failure to thrive in adult   PAD (peripheral artery disease) (HCC)   Severe sepsis (HCC)   Acute kidney injury (HCC)   Septic shock (HCC)   Pressure ulcer   Palliative care encounter   PULMONARY A: - Aspiration pneumonitis causing septic shock - Hypoxic respiartory failure - Need for mechanical ventilation Comfort care P:   Continue mouth care NPO Continue morphine drip  NEUROLOGIC A: COmfort care P:   Morphine in place, titration to rr 12-18 or pain / distress  Son updated bedside  Palliative medicine involvement appreciated  Suspect he will die in a few days, may want to consider GIP status  Will call [facility] to assume care  Heber CarolinaBrent Anmarie Fukushima, MD [place] [facility] Pager: [phone] Cell: [phone] After 3pm or if no response, call [phone]

## 2015-09-16 NOTE — Care Management Note (Signed)
Case Management Note  Patient Details  Name: Jonathan Richards MRN: [id number] Date of Birth: 11/30/1933  Subjective/Objective:           hypovolemia         Action/Plan:09/02/2015/Jonathan Earlene Plateravis, BSN, RN, CCM: [phone] Case management. Chart reviewed for discharge planning and present needs. Discharge needs: none present at time of review.   Expected Discharge Date:   (unknown)               Expected Discharge Plan:  Home/Self Care  In-House Referral:  NA  Discharge planning Services  CM Consult  Post Acute Care Choice:  NA Choice offered to:  NA  DME Arranged:    DME Agency:     HH Arranged:    HH Agency:     Status of Service:  In process, will continue to follow  Medicare Important Message Given:    Date Medicare IM Given:    Medicare IM give by:    Date Additional Medicare IM Given:    Additional Medicare Important Message give by:     If discussed at Long Length of Stay Meetings, dates discussed:    Additional Comments:  Jonathan Richards, Jonathan Lynn, RN 09/09/2015, 12:32 PM

## 2015-09-16 NOTE — Progress Notes (Signed)
Daily Progress Note   Patient Name: Jonathan Richards       Date: 08/22/2015 DOB: 10/19/1933  Age: 82 y.o. MRN#: [id number] Attending Physician: Dorothea OgleIskra M Myers, MD Primary Care Physician: Margit HanksALEXANDER, ANNE D, MD Admit Date: 08/30/2015  Reason for Consultation/Follow-up: Terminal Care  Subjective: Unresponsive, very comfortable. On 6L nasal cannula.  Interval Events: Transitioned to comfort care on 3/5. Length of Stay: 4 days  Current Medications: Scheduled Meds:  . antiseptic oral rinse  7 mL Mouth Rinse QID  . glycopyrrolate  0.4 mg Intravenous 3 times per day  . midazolam  4 mg Intravenous Once    Continuous Infusions: . morphine Stopped (01-19-16 2100)    PRN Meds: glycopyrrolate, midazolam, midazolam, morphine  Palliative Performance Scale: 10%     Vital Signs: BP 129/58 mmHg  Pulse 144  Temp(Src) 98.1 F (36.7 C) (Axillary)  Resp 15  Ht 5\' 3"  (1.6 m)  Wt 51.3 kg (113 lb 1.5 oz)  BMI 20.04 kg/m2  SpO2 93% SpO2: SpO2: 93 % O2 Device: O2 Device: Nasal Cannula O2 Flow Rate: O2 Flow Rate (L/min): 3 L/min  Intake/output summary:  Intake/Output Summary (Last 24 hours) at 08/22/15 0133 Last data filed at 01-19-16 2100  Gross per 24 hour  Intake    240 ml  Output     50 ml  Net    190 ml   LBM:   Baseline Weight: Weight: 45.813 kg (101 lb) Most recent weight: Weight: 51.3 kg (113 lb 1.5 oz)  Physical Exam: Not distressed, respirations are regular, not diaphoretic, HR 160's IRIR              Additional Data Reviewed: Recent Labs     08/19/15  0442  WBC  14.5*  HGB  12.9*  PLT  259  NA  140  BUN  15  CREATININE  0.77     Problem List:  Patient Active Problem List   Diagnosis Date Noted  . Septic shock (HCC) 08/19/2015  . Pressure ulcer 08/19/2015  . Palliative care encounter   . Acute respiratory failure with hypoxia (HCC) 08/16/2015  . Severe sepsis (HCC) 08/30/2015  . Acute kidney injury (HCC) 08/28/2015  . Aspiration pneumonia (HCC)   . GERD  without esophagitis 05/25/2015  . Failure to thrive in adult 05/25/2015  . PAD (peripheral artery disease) (HCC) 05/25/2015  . Dysphagia 04/27/2015  . Hyperlipidemia 04/27/2015  . Postoperative anemia due to acute blood loss 04/27/2015  . Hypernatremia   . Wound infection (HCC)   . Protein-calorie malnutrition, severe 04/20/2015  . Acute intracranial hemorrhage (HCC) 06/24/2013  . Hypertension 10/22/2012  . Dementia with behavioral disturbance 10/22/2012  . DM (diabetes mellitus), type 2 with peripheral vascular complications (HCC) 10/22/2012  . Psoriasis of scalp 10/22/2012     Palliative Care Assessment & Plan    Code Status:  DNR  Goals of Care:  FULL COMFORT CARE  Symptom Management:  Maintain continuous morphine infusion  DECREASE O2 nasal cannula to 2 L  Palliative Prophylaxis:  Robinul PRN  Psycho-social/Spiritual:  Desire for further Chaplaincy support:no  [facility] traditions being honored   I provided support to his daughter at the bedside.    Prognosis: Hours - Days Discharge Planning: Anticipated Hospital Death   Care plan was discussed with daughter.  Thank you for allowing the Palliative Medicine Team to assist in the care of this patient.   Time In: 2PM Time Out: 2:15PM Total Time 15 min Prolonged Time Billed no  Greater than 50%  of this time was spent counseling and coordinating care related to the above assessment and plan.   Edsel Petrin, DO  08/22/2015, 1:33 AM  Please contact Palliative Medicine Team phone at [phone] for questions and concerns.

## 2015-09-16 NOTE — Progress Notes (Signed)
Environmental services needed to know if pt was MRSA + while in hospital. Needed to access chart of deceased pt to check MRSA status.  Jonathan Mireles W Pesach Frisch, RN

## 2015-09-16 NOTE — Care Management Important Message (Signed)
Important Message  Patient Details  Name: Jonathan Richards MRN: [id number] Date of Birth: 10/24/1933   Medicare Important Message Given:  Yes    Haskell FlirtJamison, Kieffer Blatz 09/14/2015, 4:41 PMImportant Message  Patient Details  Name: Jonathan Richards MRN: [id number] Date of Birth: 01/25/1934   Medicare Important Message Given:  Yes    Haskell FlirtJamison, Gilliam Hawkes 09/08/2015, 4:41 PM

## 2015-09-16 DEATH — deceased

## 2016-05-20 IMAGING — CR DG HIP (WITH OR WITHOUT PELVIS) 5+V BILAT
6 series · 6 of 6 positions shown · non-contrast
Comparison: 06/24/2013

CLINICAL DATA: Fall today; hx alzheimer, pt was not able to give
tech any hx; head laceration; falling while ambulating with walker
and hitting head on walker

EXAM:
DG HIP (WITH OR WITHOUT PELVIS) 5+V BILAT

[t pelvis ap]
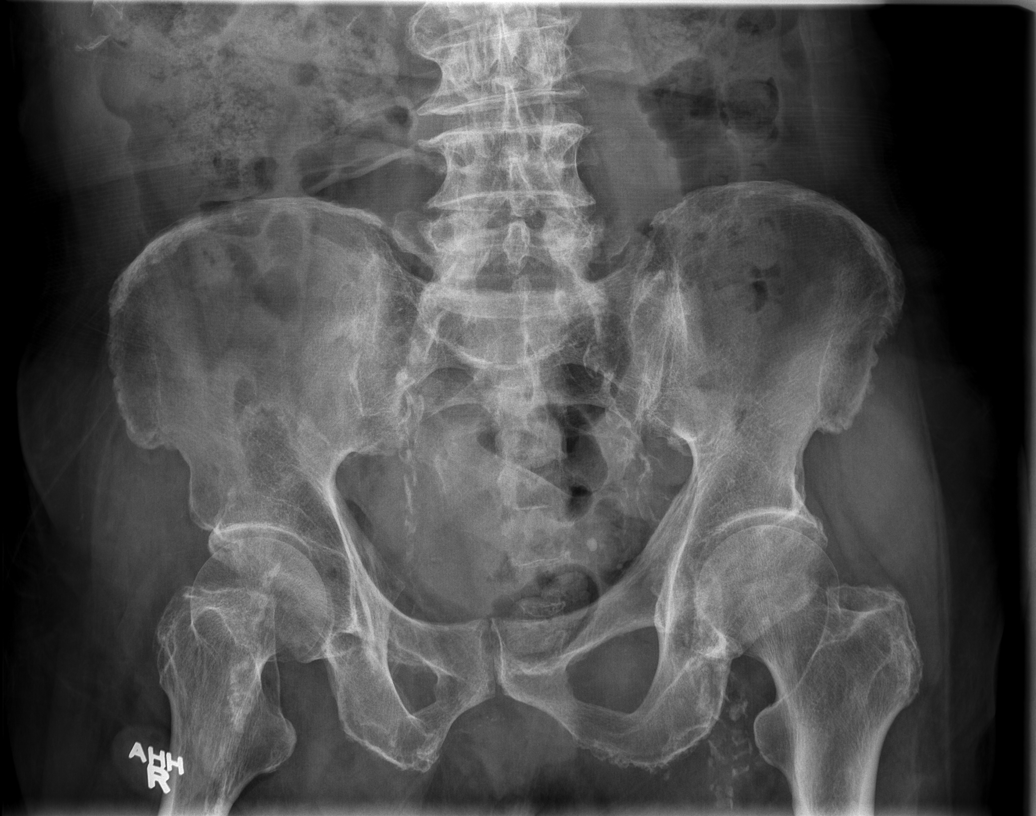

[t hip ap left]
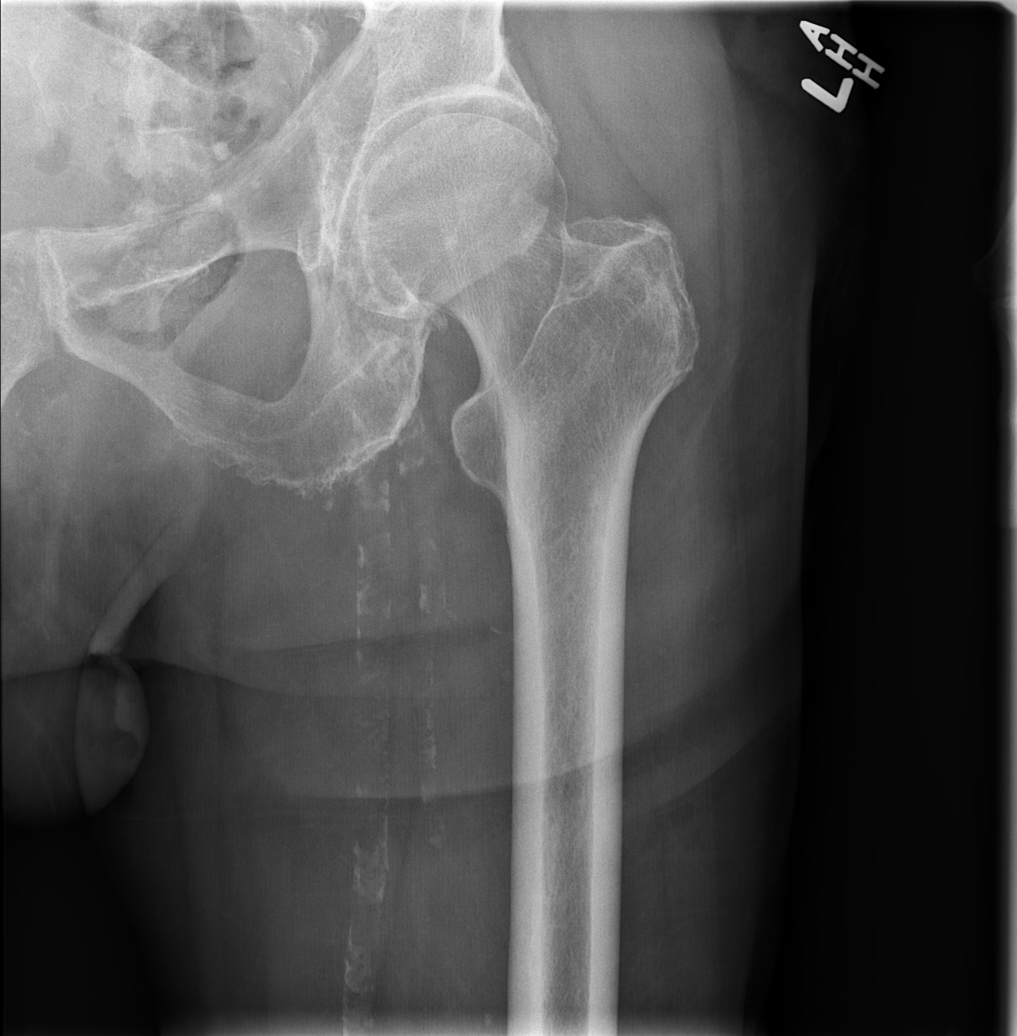

[t hip frog leg right]
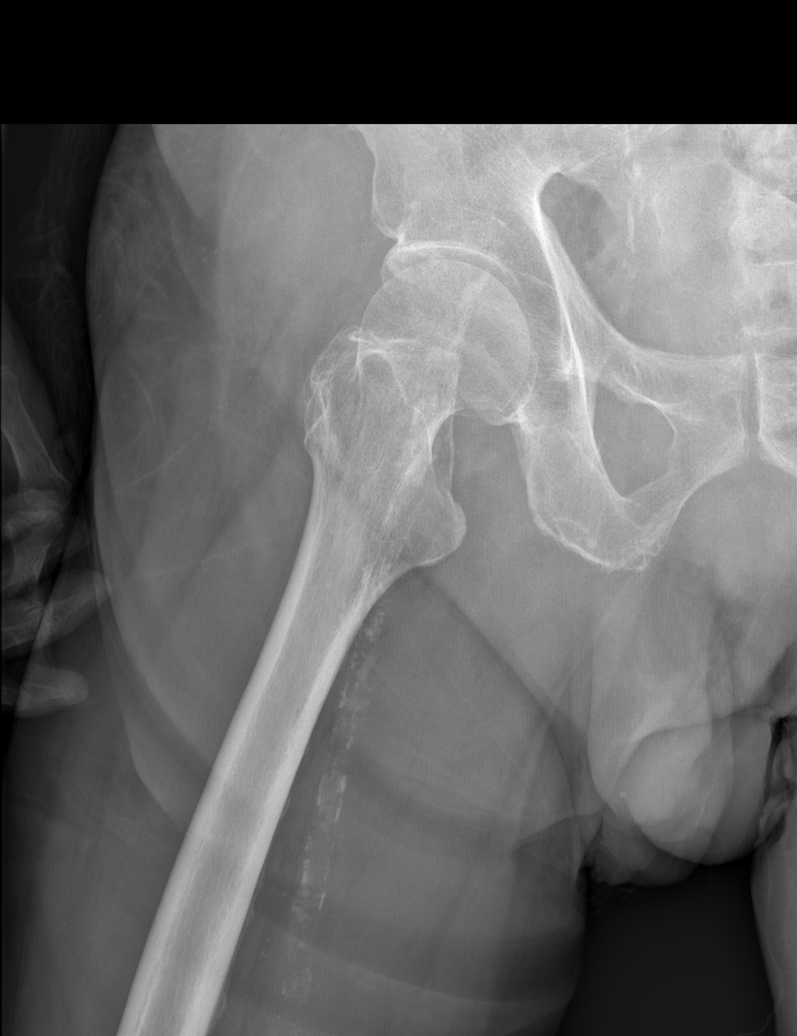

[t hip ap right (1 of 2)]
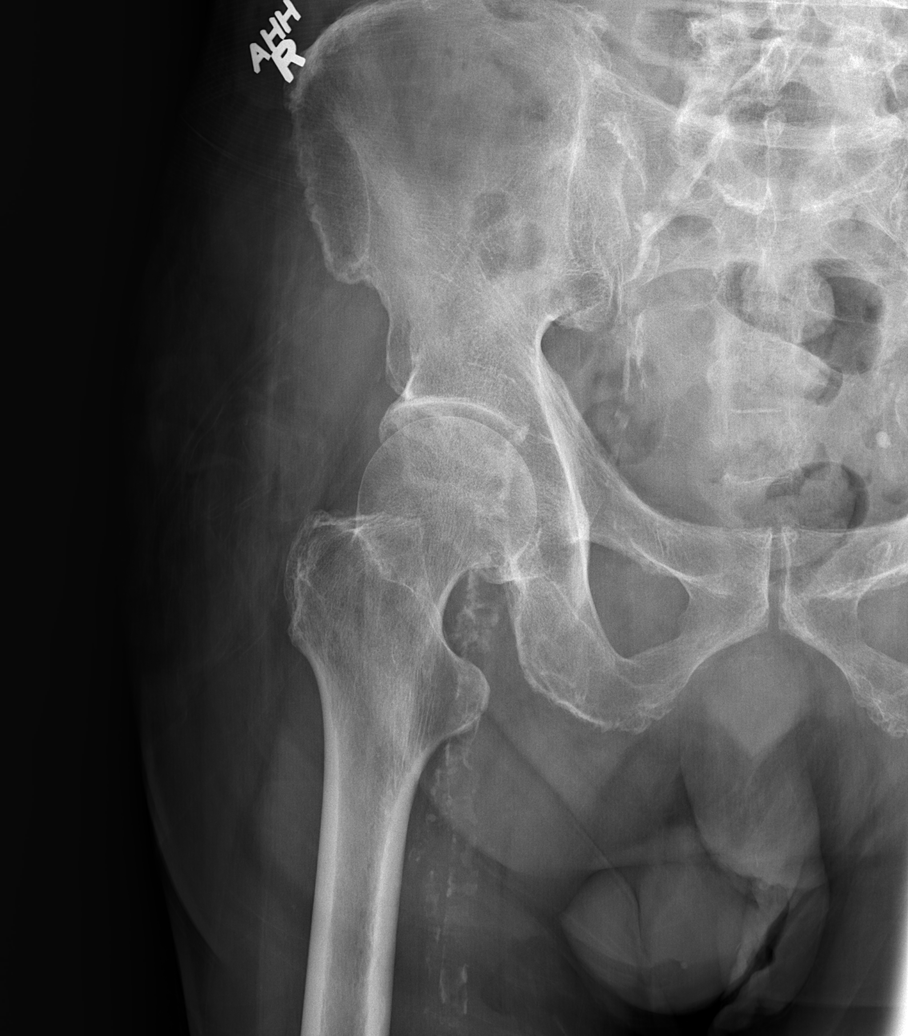

[t hip ap right (2 of 2)]
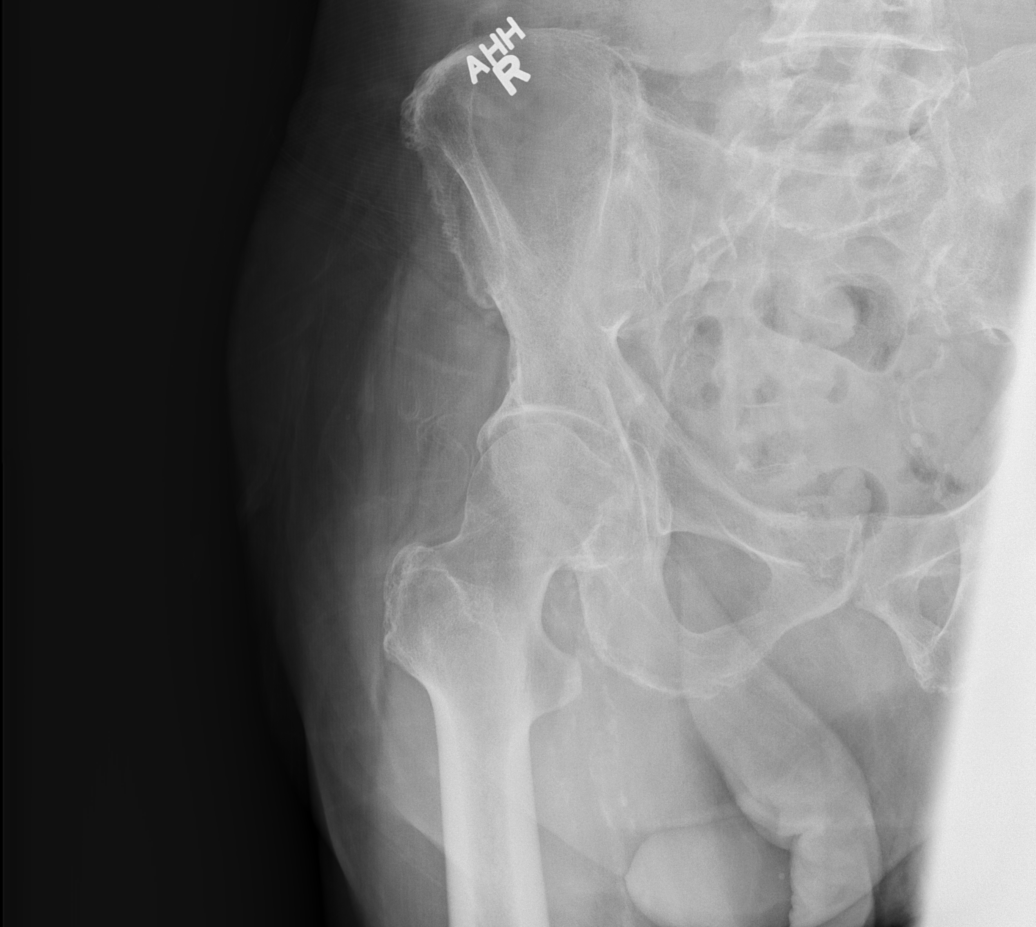

[t hip frog leg left]
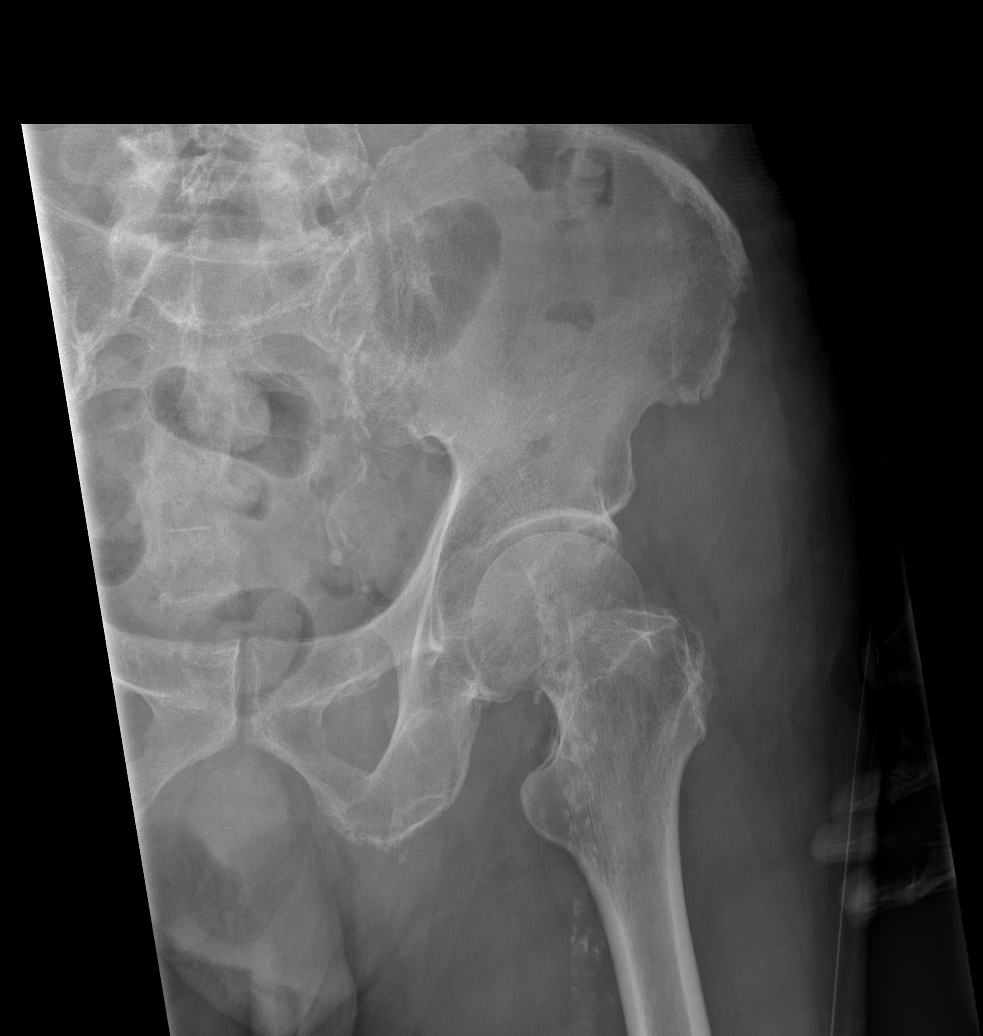

[6 of 6 positions shown; findings below may reference images not displayed]

FINDINGS: There is no evidence of hip fracture or dislocation. There is no
evidence of arthropathy or other focal bone abnormality. Extensive
bilateral iliofemoral arterial calcifications. Spondylitic changes
in the visualized lower lumbar spine. Left pelvic phlebolith.
IMPRESSION: 1. Negative bilateral hips.
2. Lower lumbar spondylitic changes.

## 2016-07-04 IMAGING — CR DG FOOT 2V*R*
2 series · 2 of 2 positions shown · non-contrast
Comparison: Right foot radiograph 03/31/2015.

CLINICAL DATA: 81-year-old male with right-sided foot pain.
Nonhealing right foot wound. History of diabetes and hypertension.

EXAM:
RIGHT FOOT - 2 VIEW

[x foot ap right]
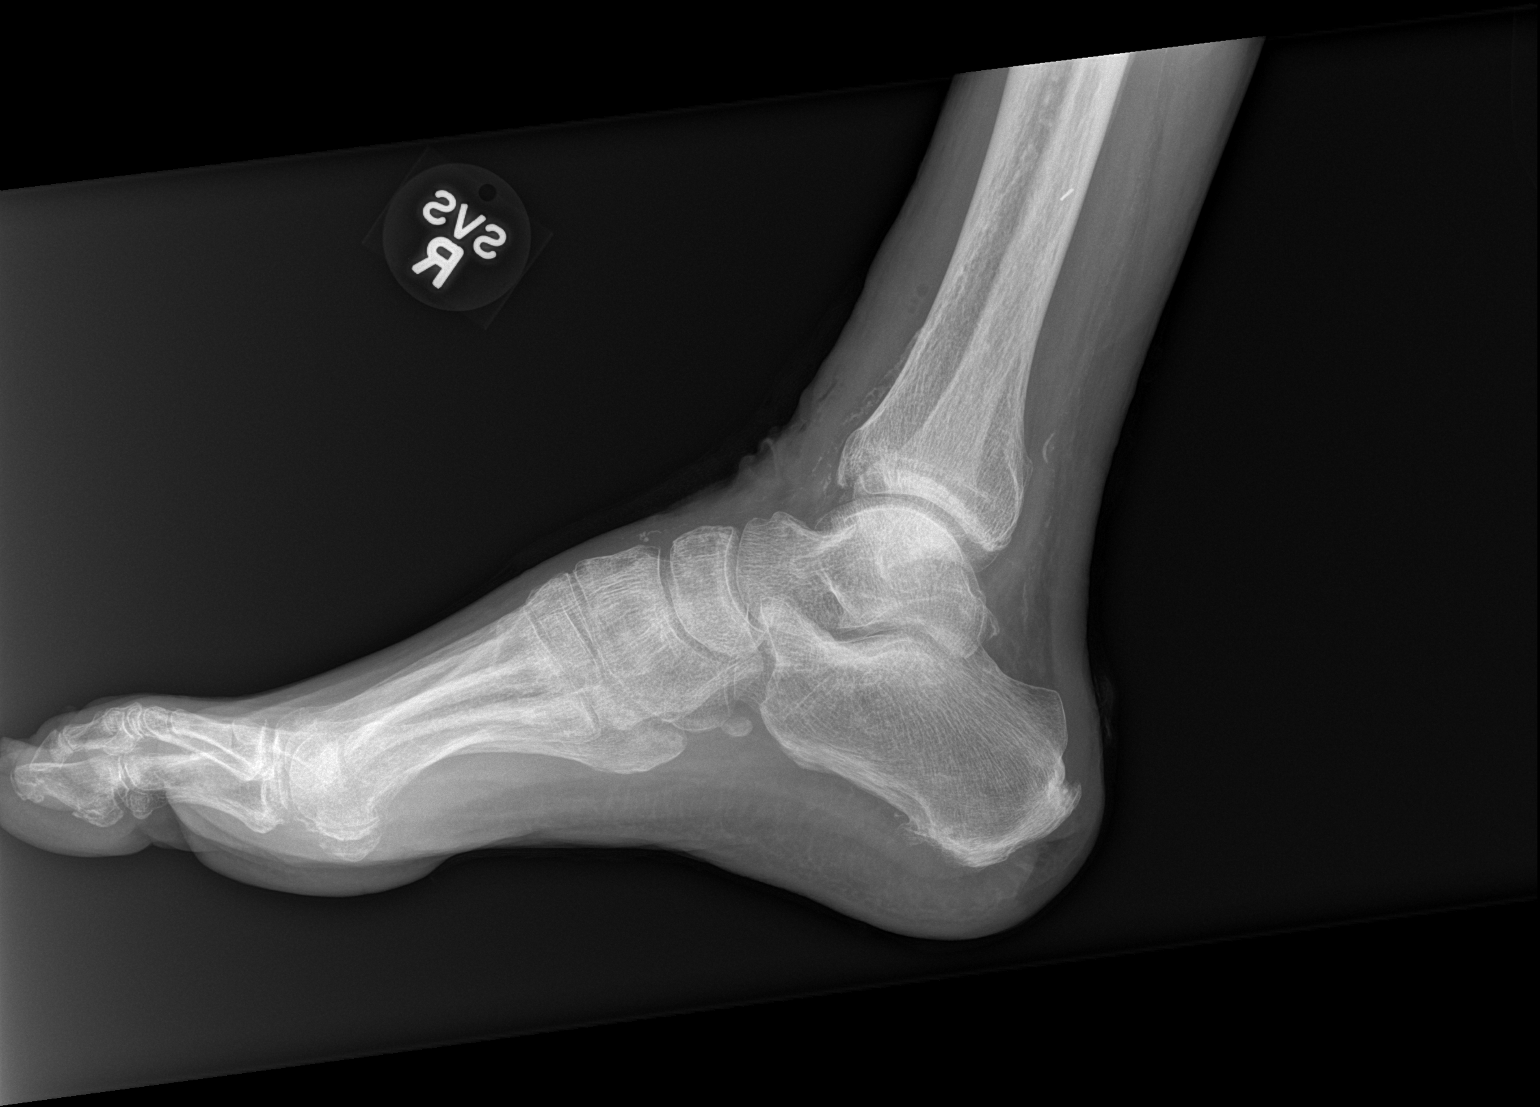

[x foot obl right]
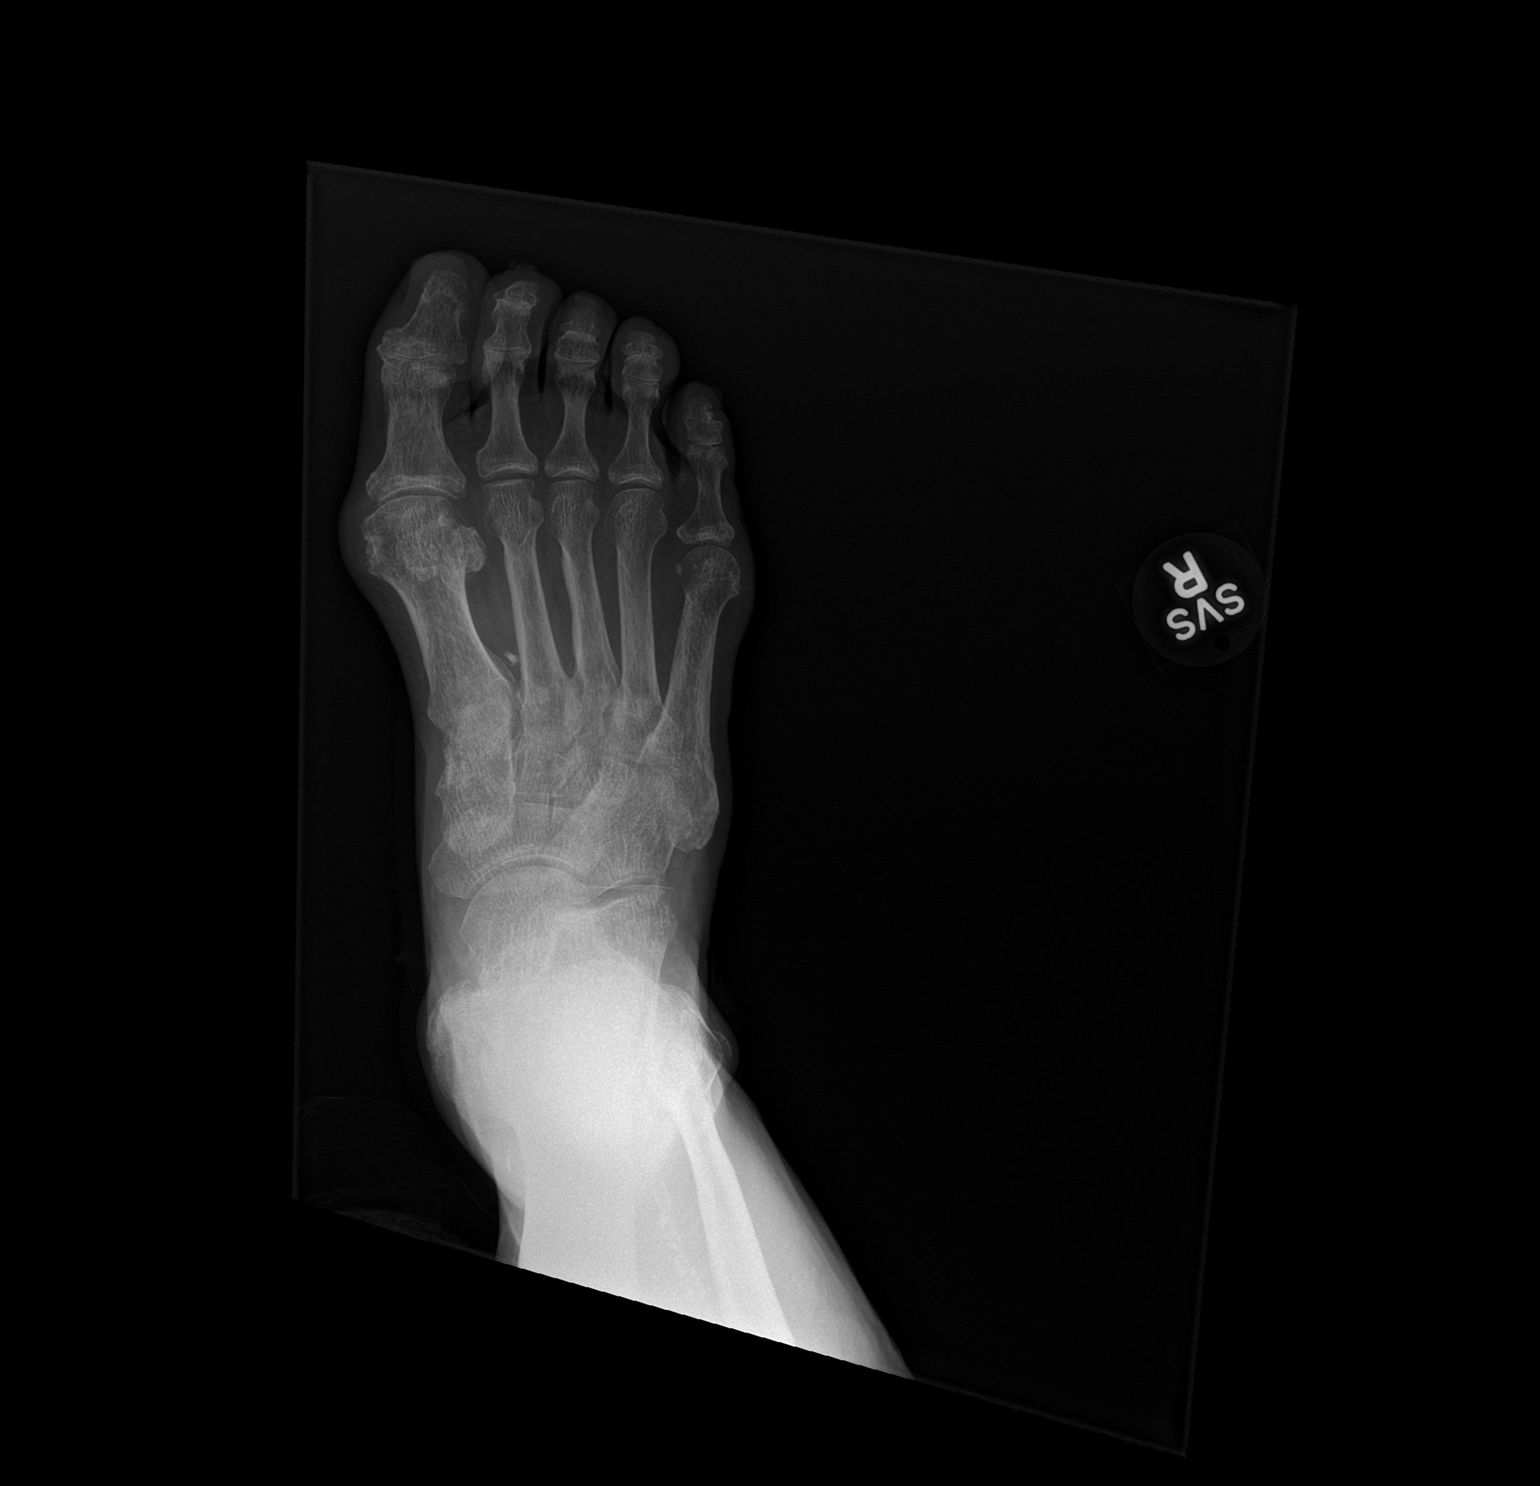

[2 of 2 positions shown; findings below may reference images not displayed]

FINDINGS: No aggressive appearing areas of osteolytic cysts are noted. No
acute displaced fracture, subluxation or dislocation. Multifocal
joint space narrowing, subchondral sclerosis, subchondral cyst
formation and osteophyte formation, compatible with osteoarthritis,
most severe in the first MTP and first interphalangeal joints.
IMPRESSION: 1. No acute radiographic abnormality of the right foot. Chronic
findings, as above

## 2016-11-02 IMAGING — DX DG CHEST 1V PORT
1 series · 1 of 1 positions shown · non-contrast
Comparison: 04/19/2015

CLINICAL DATA: Pt with SOB and increased weakness starting today.
Facility reports that the patient was normal yesterday and lethargic
today. H/o diabetes, HTN, CAD, Stroke, and alzheimers disease.
Surgical h/o CABG.Nonsmoker.

EXAM:
PORTABLE CHEST 1 VIEW

[chest ap]
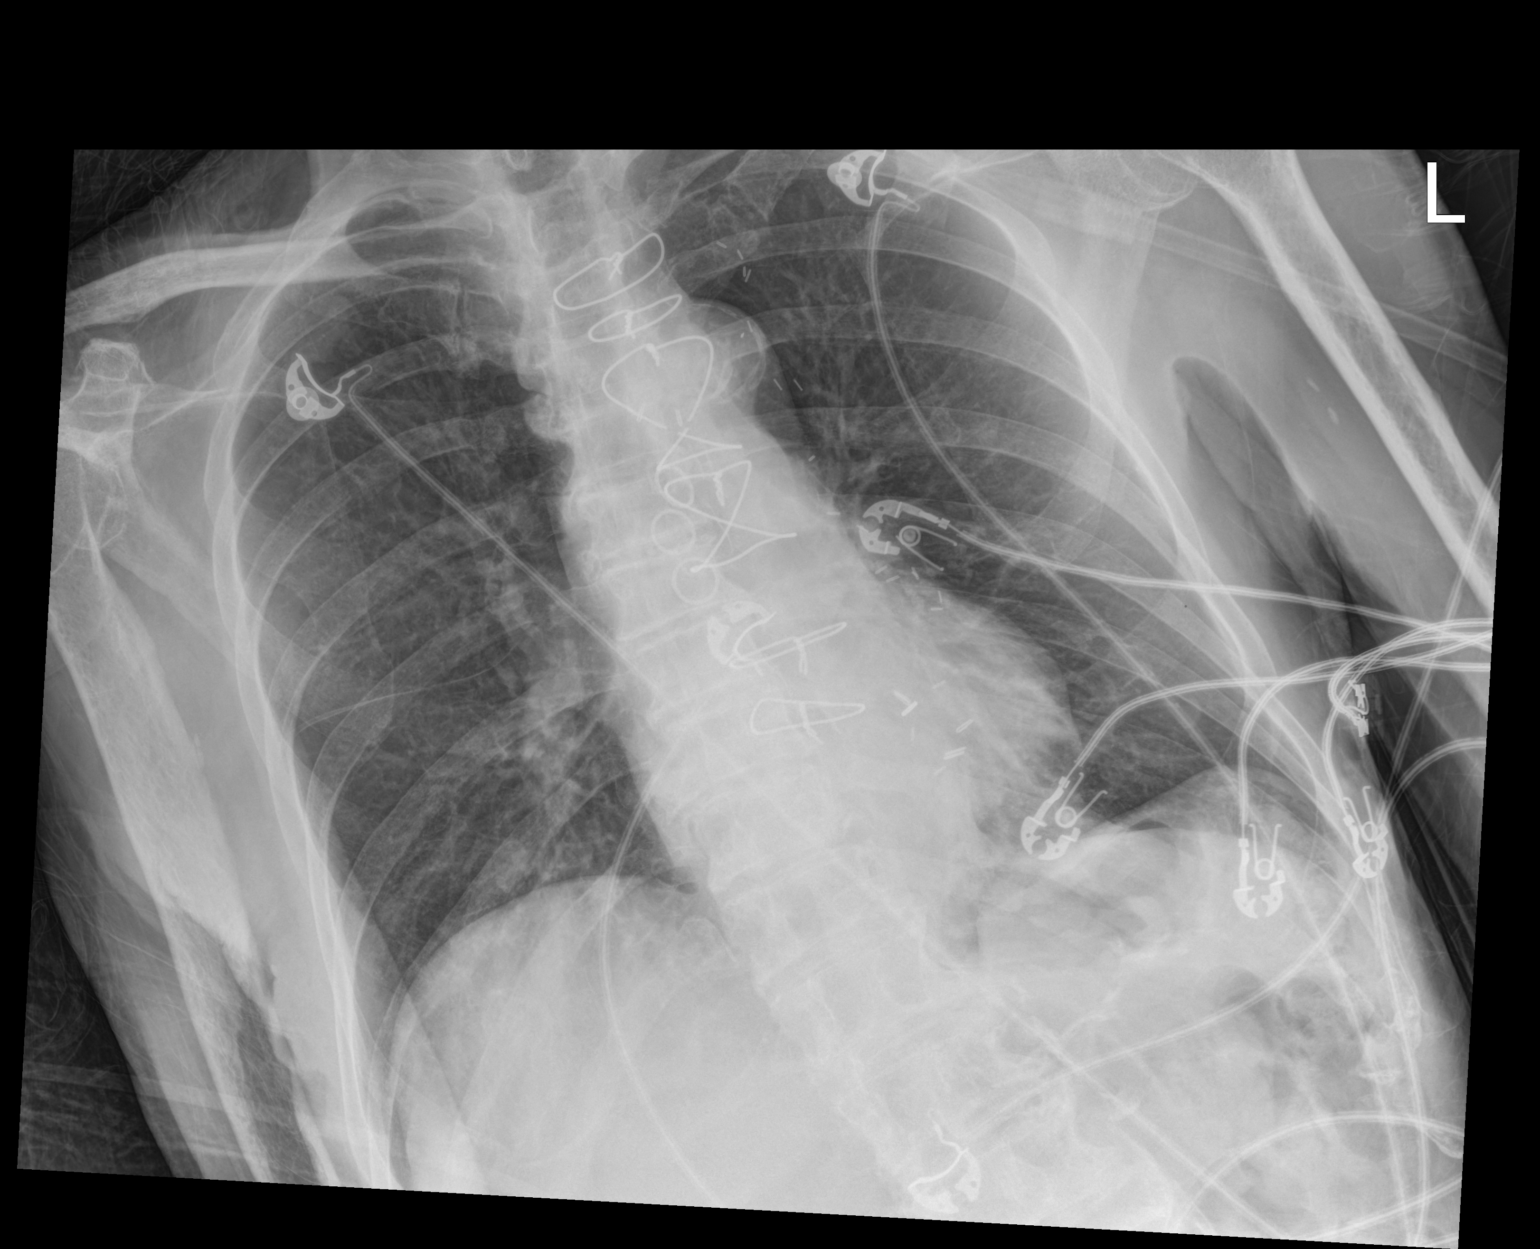

[1 of 1 positions shown; findings below may reference images not displayed]

FINDINGS: Changes from CABG surgery are stable. Cardiac silhouette is normal
in size and configuration. No mediastinal or hilar masses or
convincing adenopathy.

Clear lungs.  No pleural effusion or pneumothorax.

Bony thorax is diffusely demineralized but grossly intact.
IMPRESSION: No acute cardiopulmonary disease.

## 2016-11-03 IMAGING — DX DG CHEST 1V
1 series · 1 of 1 positions shown · non-contrast
Comparison: 08/19/2015

CLINICAL DATA: Hypoxia, orogastric tube placement

EXAM:
CHEST 1 VIEW

[chest ap]
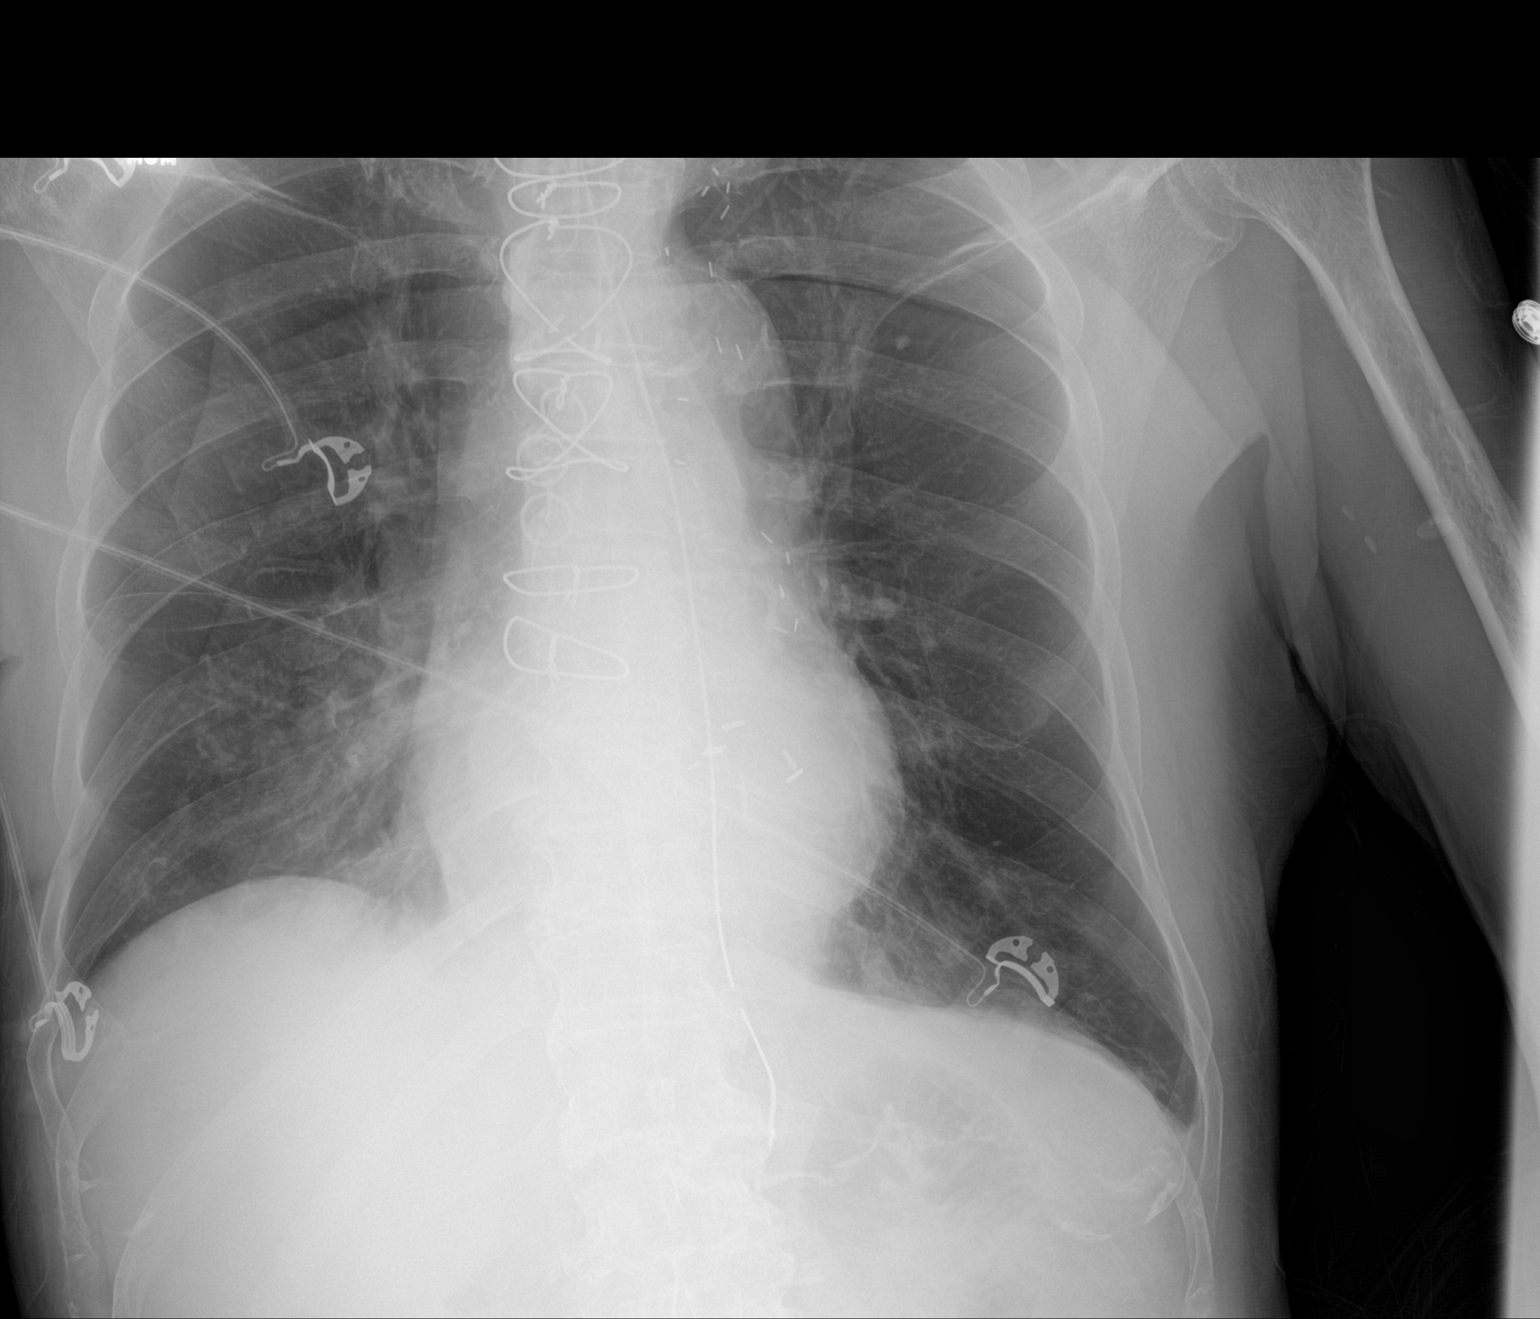

[1 of 1 positions shown; findings below may reference images not displayed]

FINDINGS: Right-sided skin folds simulate pneumothorax. Cardiac silhouette
normal. Status post CABG. Mild bibasilar opacity stable. Stable
endotracheal tube.

OG tube again identified with tip just beyond the gastroesophageal
junction by about 2.5 cm. Side hole of the orogastric tube is at the
GE junction once again.
IMPRESSION: No significant change. Orogastric tube side hole is at the GE
junction with tip about 2.5 cm beyond the gastroesophageal junction.
The tube should be advanced with repeat imaging to confirm.
